# Patient Record
Sex: Female | Born: 1950 | Race: White | Hispanic: No
Health system: Southern US, Community
[De-identification: ages and names within clinical notes are randomized; demographics above are authoritative.]

## PROBLEM LIST (undated history)

## (undated) DIAGNOSIS — J302 Other seasonal allergic rhinitis: Secondary | ICD-10-CM

## (undated) DIAGNOSIS — F419 Anxiety disorder, unspecified: Secondary | ICD-10-CM

## (undated) DIAGNOSIS — I480 Paroxysmal atrial fibrillation: Secondary | ICD-10-CM

## (undated) DIAGNOSIS — E039 Hypothyroidism, unspecified: Secondary | ICD-10-CM

## (undated) DIAGNOSIS — K219 Gastro-esophageal reflux disease without esophagitis: Secondary | ICD-10-CM

## (undated) DIAGNOSIS — I2510 Atherosclerotic heart disease of native coronary artery without angina pectoris: Secondary | ICD-10-CM

## (undated) DIAGNOSIS — I10 Essential (primary) hypertension: Secondary | ICD-10-CM

## (undated) DIAGNOSIS — D649 Anemia, unspecified: Secondary | ICD-10-CM

## (undated) DIAGNOSIS — Z8619 Personal history of other infectious and parasitic diseases: Secondary | ICD-10-CM

## (undated) DIAGNOSIS — F32A Depression, unspecified: Secondary | ICD-10-CM

## (undated) DIAGNOSIS — G43109 Migraine with aura, not intractable, without status migrainosus: Secondary | ICD-10-CM

## (undated) DIAGNOSIS — E782 Mixed hyperlipidemia: Secondary | ICD-10-CM

## (undated) DIAGNOSIS — Z5189 Encounter for other specified aftercare: Secondary | ICD-10-CM

## (undated) DIAGNOSIS — U071 COVID-19: Secondary | ICD-10-CM

## (undated) DIAGNOSIS — G4733 Obstructive sleep apnea (adult) (pediatric): Secondary | ICD-10-CM

## (undated) DIAGNOSIS — J9621 Acute and chronic respiratory failure with hypoxia: Secondary | ICD-10-CM

## (undated) DIAGNOSIS — I2699 Other pulmonary embolism without acute cor pulmonale: Secondary | ICD-10-CM

## (undated) HISTORY — DX: Anemia, unspecified: D64.9

## (undated) HISTORY — DX: Atherosclerotic heart disease of native coronary artery without angina pectoris: I25.10

## (undated) HISTORY — PX: CHOLECYSTECTOMY: SHX55

## (undated) HISTORY — PX: HERNIA REPAIR: SHX51

## (undated) HISTORY — PX: TOTAL SHOULDER ARTHROPLASTY: SHX126

## (undated) HISTORY — DX: Hypothyroidism, unspecified: E03.9

## (undated) HISTORY — DX: Anxiety disorder, unspecified: F41.9

## (undated) HISTORY — DX: Mixed hyperlipidemia: E78.2

## (undated) HISTORY — DX: Migraine with aura, not intractable, without status migrainosus: G43.109

## (undated) HISTORY — DX: Encounter for other specified aftercare: Z51.89

## (undated) HISTORY — DX: Gastro-esophageal reflux disease without esophagitis: K21.9

## (undated) HISTORY — PX: TOTAL KNEE ARTHROPLASTY: SHX125

## (undated) HISTORY — PX: TONSILLECTOMY: SUR1361

## (undated) HISTORY — DX: Depression, unspecified: F32.A

## (undated) HISTORY — PX: LUMBAR FUSION: SHX111

## (undated) HISTORY — DX: Paroxysmal atrial fibrillation: I48.0

## (undated) HISTORY — PX: ABDOMINAL MASS RESECTION: SHX1110

## (undated) HISTORY — DX: Other seasonal allergic rhinitis: J30.2

## (undated) HISTORY — PX: PARTIAL HYSTERECTOMY: SHX80

## (undated) HISTORY — DX: Essential (primary) hypertension: I10

## (undated) HISTORY — DX: Personal history of other infectious and parasitic diseases: Z86.19

---

## 1996-12-12 ENCOUNTER — Emergency Department: Admit: 1996-12-12 | Payer: Self-pay | Source: Emergency Department | Admitting: Emergency Medicine

## 1997-03-01 ENCOUNTER — Ambulatory Visit: Admission: AD | Admit: 1997-03-01 | Payer: Self-pay | Source: Ambulatory Visit | Admitting: Urology

## 1997-04-08 ENCOUNTER — Ambulatory Visit
Admission: RE | Admit: 1997-04-08 | Disposition: A | Discharge status: Home / Self Care | Payer: Self-pay | Source: Ambulatory Visit | Admitting: Urology

## 2007-05-29 DIAGNOSIS — M5137 Other intervertebral disc degeneration, lumbosacral region: Secondary | ICD-10-CM

## 2007-05-29 HISTORY — DX: Other intervertebral disc degeneration, lumbosacral region: M51.37

## 2007-05-30 DIAGNOSIS — G5600 Carpal tunnel syndrome, unspecified upper limb: Secondary | ICD-10-CM

## 2007-05-30 HISTORY — DX: Carpal tunnel syndrome, unspecified upper limb: G56.00

## 2007-07-10 DIAGNOSIS — M5126 Other intervertebral disc displacement, lumbar region: Secondary | ICD-10-CM

## 2007-07-10 HISTORY — DX: Other intervertebral disc displacement, lumbar region: M51.26

## 2008-02-09 DIAGNOSIS — I219 Acute myocardial infarction, unspecified: Secondary | ICD-10-CM

## 2008-02-09 HISTORY — DX: Acute myocardial infarction, unspecified: I21.9

## 2008-10-16 ENCOUNTER — Emergency Department: Admit: 2008-10-16 | Payer: Self-pay | Source: Emergency Department | Admitting: Emergency Medical Services

## 2009-05-07 DIAGNOSIS — G959 Disease of spinal cord, unspecified: Secondary | ICD-10-CM

## 2009-05-07 DIAGNOSIS — M4802 Spinal stenosis, cervical region: Secondary | ICD-10-CM

## 2009-05-07 HISTORY — DX: Disease of spinal cord, unspecified: G95.9

## 2009-05-07 HISTORY — DX: Spinal stenosis, cervical region: M48.02

## 2009-07-30 DIAGNOSIS — M47812 Spondylosis without myelopathy or radiculopathy, cervical region: Secondary | ICD-10-CM

## 2009-07-30 DIAGNOSIS — M4306 Spondylolysis, lumbar region: Secondary | ICD-10-CM

## 2009-07-30 HISTORY — DX: Spondylolysis, lumbar region: M43.06

## 2009-07-30 HISTORY — DX: Spondylosis without myelopathy or radiculopathy, cervical region: M47.812

## 2011-08-19 DIAGNOSIS — G4733 Obstructive sleep apnea (adult) (pediatric): Secondary | ICD-10-CM

## 2011-08-19 DIAGNOSIS — Z9989 Dependence on other enabling machines and devices: Secondary | ICD-10-CM | POA: Insufficient documentation

## 2011-08-19 DIAGNOSIS — Z955 Presence of coronary angioplasty implant and graft: Secondary | ICD-10-CM

## 2011-08-19 HISTORY — DX: Obstructive sleep apnea (adult) (pediatric): G47.33

## 2011-08-19 HISTORY — DX: Presence of coronary angioplasty implant and graft: Z95.5

## 2012-10-07 ENCOUNTER — Inpatient Hospital Stay
Admission: AD | Admit: 2012-10-07 | Discharge: 2012-10-09 | DRG: 206 | Disposition: A | Discharge status: Home / Self Care | Payer: Medicare Other | Source: Other Acute Inpatient Hospital | Attending: Internal Medicine | Admitting: Internal Medicine

## 2012-10-07 ENCOUNTER — Encounter: Payer: Self-pay | Admitting: Internal Medicine

## 2012-10-07 ENCOUNTER — Inpatient Hospital Stay: Payer: Medicare Other | Admitting: Hospitalist

## 2012-10-07 ENCOUNTER — Observation Stay: Payer: Medicare Other

## 2012-10-07 DIAGNOSIS — I2699 Other pulmonary embolism without acute cor pulmonale: Secondary | ICD-10-CM

## 2012-10-07 DIAGNOSIS — M79609 Pain in unspecified limb: Secondary | ICD-10-CM | POA: Diagnosis present

## 2012-10-07 DIAGNOSIS — M940 Chondrocostal junction syndrome [Tietze]: Principal | ICD-10-CM | POA: Diagnosis present

## 2012-10-07 DIAGNOSIS — D649 Anemia, unspecified: Secondary | ICD-10-CM | POA: Diagnosis present

## 2012-10-07 DIAGNOSIS — E039 Hypothyroidism, unspecified: Secondary | ICD-10-CM | POA: Diagnosis present

## 2012-10-07 DIAGNOSIS — K219 Gastro-esophageal reflux disease without esophagitis: Secondary | ICD-10-CM | POA: Diagnosis present

## 2012-10-07 DIAGNOSIS — Z87442 Personal history of urinary calculi: Secondary | ICD-10-CM

## 2012-10-07 DIAGNOSIS — E669 Obesity, unspecified: Secondary | ICD-10-CM | POA: Diagnosis present

## 2012-10-07 DIAGNOSIS — Z6841 Body Mass Index (BMI) 40.0 and over, adult: Secondary | ICD-10-CM

## 2012-10-07 DIAGNOSIS — Z8249 Family history of ischemic heart disease and other diseases of the circulatory system: Secondary | ICD-10-CM

## 2012-10-07 DIAGNOSIS — Z9861 Coronary angioplasty status: Secondary | ICD-10-CM

## 2012-10-07 DIAGNOSIS — I2510 Atherosclerotic heart disease of native coronary artery without angina pectoris: Secondary | ICD-10-CM | POA: Diagnosis present

## 2012-10-07 DIAGNOSIS — G4733 Obstructive sleep apnea (adult) (pediatric): Secondary | ICD-10-CM | POA: Diagnosis present

## 2012-10-07 DIAGNOSIS — E785 Hyperlipidemia, unspecified: Secondary | ICD-10-CM | POA: Diagnosis present

## 2012-10-07 DIAGNOSIS — M109 Gout, unspecified: Secondary | ICD-10-CM | POA: Diagnosis present

## 2012-10-07 DIAGNOSIS — N200 Calculus of kidney: Secondary | ICD-10-CM

## 2012-10-07 DIAGNOSIS — I10 Essential (primary) hypertension: Secondary | ICD-10-CM | POA: Diagnosis present

## 2012-10-07 DIAGNOSIS — Z7901 Long term (current) use of anticoagulants: Secondary | ICD-10-CM

## 2012-10-07 DIAGNOSIS — Z7982 Long term (current) use of aspirin: Secondary | ICD-10-CM

## 2012-10-07 DIAGNOSIS — Z91041 Radiographic dye allergy status: Secondary | ICD-10-CM

## 2012-10-07 DIAGNOSIS — I252 Old myocardial infarction: Secondary | ICD-10-CM

## 2012-10-07 DIAGNOSIS — Z90710 Acquired absence of both cervix and uterus: Secondary | ICD-10-CM

## 2012-10-07 DIAGNOSIS — F3289 Other specified depressive episodes: Secondary | ICD-10-CM | POA: Diagnosis present

## 2012-10-07 DIAGNOSIS — Z86711 Personal history of pulmonary embolism: Secondary | ICD-10-CM

## 2012-10-07 HISTORY — DX: Gout, unspecified: M10.9

## 2012-10-07 HISTORY — DX: Calculus of kidney: N20.0

## 2012-10-07 HISTORY — DX: Obesity, unspecified: E66.9

## 2012-10-07 HISTORY — DX: Other pulmonary embolism without acute cor pulmonale: I26.99

## 2012-10-07 LAB — ECG 12-LEAD
Atrial Rate: 84 {beats}/min
P Axis: 43 degrees
P-R Interval: 174 ms
Q-T Interval: 376 ms
QRS Duration: 84 ms
QTC Calculation (Bezet): 444 ms
R Axis: -6 degrees
T Axis: 3 degrees
Ventricular Rate: 84 {beats}/min

## 2012-10-07 LAB — TROPONIN I
Troponin I: 0.02 ng/mL (ref 0.00–0.09)
Troponin I: 0 ng/mL (ref 0.00–0.09)
Troponin I: 0.02 ng/mL (ref 0.00–0.09)

## 2012-10-07 LAB — CK
Creatine Kinase (CK): 22 U/L — ABNORMAL LOW (ref 29–168)
Creatine Kinase (CK): 21 U/L — ABNORMAL LOW (ref 29–168)
Creatine Kinase (CK): 22 U/L — ABNORMAL LOW (ref 29–168)

## 2012-10-07 LAB — PT/INR
PT INR: 2.2 — ABNORMAL HIGH (ref 0.9–1.1)
PT: 24.6 — ABNORMAL HIGH (ref 12.6–15.0)

## 2012-10-07 MED ORDER — WARFARIN SODIUM 2 MG PO TABS
4.0000 mg | ORAL_TABLET | Freq: Every day | ORAL | Status: DC
Start: 2012-10-07 — End: 2012-10-09
  Administered 2012-10-07 – 2012-10-08 (×2): 4 mg via ORAL
  Filled 2012-10-07 (×2): qty 2

## 2012-10-07 MED ORDER — PREDNISONE 5 MG PO TABS
50.0000 mg | ORAL_TABLET | Freq: Four times a day (QID) | ORAL | Status: AC
Start: 2012-10-08 — End: 2012-10-09
  Administered 2012-10-08 – 2012-10-09 (×3): 50 mg via ORAL
  Filled 2012-10-07 (×6): qty 2

## 2012-10-07 MED ORDER — ASPIRIN 81 MG PO CHEW
81.0000 mg | CHEWABLE_TABLET | Freq: Every day | ORAL | Status: DC
Start: 2012-10-07 — End: 2012-10-09
  Administered 2012-10-07 – 2012-10-09 (×3): 81 mg via ORAL
  Filled 2012-10-07 (×3): qty 1

## 2012-10-07 MED ORDER — LISINOPRIL 20 MG PO TABS
20.0000 mg | ORAL_TABLET | Freq: Every evening | ORAL | Status: DC
Start: 2012-10-07 — End: 2012-10-08
  Administered 2012-10-07: 20 mg via ORAL
  Filled 2012-10-07: qty 1

## 2012-10-07 MED ORDER — NALOXONE HCL 0.4 MG/ML IJ SOLN
0.2000 mg | INTRAMUSCULAR | Status: DC | PRN
Start: 2012-10-07 — End: 2012-10-09

## 2012-10-07 MED ORDER — NITROGLYCERIN 0.4 MG SL SUBL
0.4000 mg | SUBLINGUAL_TABLET | SUBLINGUAL | Status: DC | PRN
Start: 2012-10-07 — End: 2012-10-09

## 2012-10-07 MED ORDER — PANTOPRAZOLE SODIUM 40 MG PO TBEC
40.0000 mg | DELAYED_RELEASE_TABLET | Freq: Every day | ORAL | Status: DC
Start: 2012-10-07 — End: 2012-10-09
  Administered 2012-10-07 – 2012-10-09 (×3): 40 mg via ORAL
  Filled 2012-10-07 (×3): qty 1

## 2012-10-07 MED ORDER — METOPROLOL TARTRATE 25 MG PO TABS
12.5000 mg | ORAL_TABLET | Freq: Two times a day (BID) | ORAL | Status: DC
Start: 2012-10-07 — End: 2012-10-09
  Administered 2012-10-07 – 2012-10-09 (×5): 12.5 mg via ORAL
  Filled 2012-10-07 (×5): qty 1

## 2012-10-07 MED ORDER — LEVOTHYROXINE SODIUM 25 MCG PO TABS
25.0000 ug | ORAL_TABLET | Freq: Every day | ORAL | Status: DC
Start: 2012-10-07 — End: 2012-10-09
  Administered 2012-10-07 – 2012-10-09 (×3): 25 ug via ORAL
  Filled 2012-10-07 (×3): qty 1

## 2012-10-07 MED ORDER — ONDANSETRON HCL 4 MG/2ML IJ SOLN
4.0000 mg | Freq: Three times a day (TID) | INTRAMUSCULAR | Status: DC | PRN
Start: 2012-10-07 — End: 2012-10-09
  Administered 2012-10-08: 4 mg via INTRAVENOUS
  Filled 2012-10-07: qty 2

## 2012-10-07 MED ORDER — MORPHINE SULFATE 2 MG/ML IJ/IV SOLN (WRAP)
2.0000 mg | Status: DC | PRN
Start: 2012-10-07 — End: 2012-10-09
  Administered 2012-10-07 – 2012-10-09 (×8): 2 mg via INTRAVENOUS
  Filled 2012-10-07 (×8): qty 1

## 2012-10-07 MED ORDER — DIPHENHYDRAMINE HCL 25 MG PO CAPS
50.0000 mg | ORAL_CAPSULE | Freq: Once | ORAL | Status: AC
Start: 2012-10-09 — End: 2012-10-09
  Administered 2012-10-09: 50 mg via ORAL
  Filled 2012-10-07: qty 2

## 2012-10-07 MED ORDER — ALLOPURINOL 300 MG PO TABS
300.0000 mg | ORAL_TABLET | Freq: Every day | ORAL | Status: DC
Start: 2012-10-07 — End: 2012-10-09
  Administered 2012-10-07 – 2012-10-09 (×3): 300 mg via ORAL
  Filled 2012-10-07 (×3): qty 1

## 2012-10-07 MED ORDER — ACETAMINOPHEN 325 MG PO TABS
650.0000 mg | ORAL_TABLET | Freq: Four times a day (QID) | ORAL | Status: DC | PRN
Start: 2012-10-07 — End: 2012-10-09
  Administered 2012-10-08: 650 mg via ORAL
  Filled 2012-10-07: qty 2

## 2012-10-07 MED ORDER — PRAVASTATIN SODIUM 20 MG PO TABS
40.0000 mg | ORAL_TABLET | Freq: Every day | ORAL | Status: DC
Start: 2012-10-07 — End: 2012-10-09
  Administered 2012-10-07 – 2012-10-09 (×3): 40 mg via ORAL
  Filled 2012-10-07 (×3): qty 2

## 2012-10-07 MED ORDER — LISINOPRIL 20 MG PO TABS
20.0000 mg | ORAL_TABLET | Freq: Every day | ORAL | Status: DC
Start: 2012-10-07 — End: 2012-10-07
  Filled 2012-10-07: qty 1

## 2012-10-07 MED ORDER — CITALOPRAM HYDROBROMIDE 20 MG PO TABS
20.0000 mg | ORAL_TABLET | Freq: Every day | ORAL | Status: DC
Start: 2012-10-07 — End: 2012-10-09
  Administered 2012-10-07 – 2012-10-09 (×3): 20 mg via ORAL
  Filled 2012-10-07 (×3): qty 1

## 2012-10-07 MED ORDER — POLYETHYLENE GLYCOL 3350 17 G PO PACK
17.0000 g | PACK | Freq: Every day | ORAL | Status: DC
Start: 2012-10-07 — End: 2012-10-09
  Administered 2012-10-07 – 2012-10-08 (×2): 17 g via ORAL
  Filled 2012-10-07 (×3): qty 1

## 2012-10-07 MED ORDER — WARFARIN SODIUM 2 MG PO TABS
4.0000 mg | ORAL_TABLET | Freq: Every day | ORAL | Status: DC
Start: 2012-10-07 — End: 2012-10-07

## 2012-10-07 NOTE — Plan of Care (Signed)
Please make sure pt receives her AM dose of metoprolol prior to the Coronary CT on Monday morning 10/09/12, thanks

## 2012-10-07 NOTE — H&P (Signed)
ADMISSION HISTORY AND PHYSICAL EXAM    Date Time: 10/07/2012 3:28 AM  Patient Name: [name]  Attending Physician: Jerral Ralph, MD  Primary Care Physician: No primary provider on file.    CC: Transfer from [place] for chest pain  Patient seen and examined in CTU [place] at 3:15am.      Assessment:   Active Problems:   Chest pain   Coronary artery disease   HTN (hypertension)   HLD (hyperlipidemia)   Gout   Nephrolithiasis   History of pulmonary embolism   Hypothyroid   Obesity   Depression        62 yo WF excellent historian with history of CAD/MI s/p stents x6 (four 06/2008, one 06/2008, one 12/2008), history of PE May 2014 on Coumadin, obesity, HTN, HLD, hypothyroidism, depression, gout, h/o kidney stones, anemia who was transferred from [facility] for CP, ACS rule out.     Plan:   # Chest Pain: concerning for ACS givesn significant history and risk factors, but reassuring neg chemical stress test July 2014  - admit observation, cardiac tele  - cardiac enzymes q6  - continue home: ASA, ACE, BB, statin, PPI (was on eliquis for DES, but cards told her while on Coumadin she can be off it. Will eventually go back on eliquis when Coumadin done in 6 months)  - PRN zofran, PRN morphine  - recheck EKG  - NPO except meds    #History of PE: continue Coumadin, f/u INR in AM    # HTN: continue BB, ACE  # HLD: continue statin, ASA  # History of gout: continue allopurniol  # Hypothyroidism: continue synthroid  # Depression: continue celexa  # Anemia, normocytic: continue to monitor  # GERD: continue home PPI  # FEN  - no IVF  - NPO except meds    #PPx  - SCDs, continue home PPI, fully anticoagulated    Full Code      Disposition:     Anticipated medical stability for discharge: August,  30 - Evening  Service status: Observation:patient is stable and improving.  Reason for ongoing hospitalization: trend CE  Anticipated discharge needs: cards follow up      History of Presenting Illness:   [name] is a 62  y.o. female who presents to the hospital with chest pain.    62 yo WF excellent historian with history of CAD/MI s/p stent x6 (LAD and RCA, most recently Nov 2010), HTN, HLD, PE May 2014 on Coumadin, obesity, hypothyroidism, gout, anemia, h/o kidney stones presents with chest pain. Pt lives in [place]. Was driving with friend to [place], [place] and on way home this afternoon ~3pm when noted onset of left sided chest heaviness radiating up to neck and then eventually to left arm. Pulled over and vomited. Continued to drive. Pulled over to vomit again. Dropped friend off and went to nearest hospital which was [facility]. She thought this was indigestion which is why she continued driving. However now she thinks it does feel similar to her MI in May 2010. She states CP was heaviness, occasionally sharp. It would lighten, but never fully go away and it was always there. The previous 2 days, she felt tired, slept more often. Otherwise no recent diarrhea, constipation, dysuria, LE edema, othopnea, PNA, other travel, poor PO, med non-adherence.     Cards is Dr. Alyce Pagan at [facility]. Had a chemical stress test July 2014 that was "unremarkable"    PE was May 2014, unprovoked, currently  on Coumadin x6 months. Admitted x1 week where also discovered to have anemia and received PRBC transfusion. Had an EGD which was unremarkable. Unable to do colo as on Coumadin. But had a colo Nov 2013 which was reportedly normal. Had mammogram within the year, unremarkable. Has hysterectomy for AUB after delivery of child.    Past Medical History:     Past Medical History   Diagnosis Date   . Seasonal allergic rhinitis    . Anemia    . Encounter for blood transfusion    . Depression    . Gastroesophageal reflux disease    . Hyperlipidemia    . Hypertension    . Myocardial infarction    . Disorder of thyroid        Available old records reviewed, including:  Never been to [facility], review [facility] records    Past Surgical History:      Past Surgical History   Procedure Date   . Joint replacement    . Abdominal surgery    . Back surgery    . Spine surgery    . Hernia repair    . Hysterectomy        Family History:   Sister - fatal MI at age 31  Multiple cousins, MI  Paternal aunt - MI  Maternal aunt - MI    Social History:     History   Smoking status   . Never Smoker    Smokeless tobacco   . Not on file     History   Alcohol Use No     History   Drug Use No     Never smokes, doesn't drink  Lives alone in [place]  Independent in all ADLs  2 children    Allergies:     Allergies   Allergen Reactions   . Codeine    . Iodine    . Red Dye    . Tramadol        Medications:     Current/Home Medications    ALLOPURINOL (ZYLOPRIM) 300 MG TABLET    Take 300 mg by mouth daily.    ASPIRIN 81 MG CHEWABLE TABLET    Chew 81 mg by mouth daily.    CHOLINE FENOFIBRATE (FENOFIBRIC ACID) 135 MG CAPSULE DELAYED RELEASE    Take by mouth.    CITALOPRAM (CELEXA) 20 MG TABLET    Take 20 mg by mouth daily.    LEVOTHYROXINE (SYNTHROID, LEVOTHROID) 25 MCG TABLET    Take 25 mcg by mouth.    METOPROLOL (LOPRESSOR) 25 MG TABLET    Take 12.5 mg by mouth 2 (two) times daily.    PANTOPRAZOLE (PROTONIX) 40 MG TABLET    Take 40 mg by mouth daily.    PRAVASTATIN (PRAVACHOL) 40 MG TABLET    Take 40 mg by mouth daily.    QUINAPRIL (ACCUPRIL) 20 MG TABLET    Take 20 mg by mouth nightly.    WARFARIN (COUMADIN) 4 MG TABLET    Take 4 mg by mouth daily.        Method by which medications were confirmed on admission: pt confirms    Review of Systems:   All other systems were reviewed and are negative except: per HPI    Physical Exam:     Patient Vitals for the past 24 hrs:   BP Temp Temp src Pulse Resp SpO2 Height Weight   10/07/12 0109 145/77 mmHg 96.8 F (36 C) Oral 84  15  94 %  1.524 m (5') 97.523 kg (215 lb)     Body mass index is 41.99 kg/(m^2).  No intake or output data in the 24 hours ending 10/07/12 0328    General: awake, alert, oriented x 3; no acute distress, obese,  pleasant  HEENT: perrla, eomi, sclera anicteric  oropharynx clear without lesions, mucous membranes moist  Neck: supple, no lymphadenopathy, no thyromegaly, no JVD, no carotid bruits  Cardiovascular: regular rate and rhythm, no murmurs, rubs or gallops  Lungs: clear to auscultation bilaterally, without wheezing, rhonchi, or rales  Abdomen: soft, non-tender, non-distended; no palpable masses, no hepatosplenomegaly, normoactive bowel sounds, no rebound or guarding  Chest: no chest wall or neck tenderness  Extremities: no clubbing, cyanosis, or edema  Neuro: cranial nerves grossly intact, strength 5/5 in upper and lower extremities, sensation intact, face symmetric  Skin: no rashes or lesions noted        Labs:     Results     ** No Results found for the last 24 hours. **        10/06/2012 [facility]  Na 144, K 4.2, Cl 110, HCO 24, BUN 24, Cr 1.1, Glu 95  TPro 7.3, Alb 4.1, AST 27, ALT 26, AlkP 50  INR 2.62  WBC 4.56, Hg 10.6, Hct 32.6, Plt 201, MCV 92.1  CKMB 0.25  Trop <0.012  EKG: TWI V1      Imaging personally reviewed, including: none - comes with a CD, but no report or mention in ER report of results    Safety Checklist  DVT prophylaxis:  CHEST guideline (See page e199S) Chemical and / or mechanical ppx NOT indicated or contraindicated: Fully anticoagulated    Foley:  [place] Rn Foley protocol Not present   IVs:  Peripheral IV   PT/OT: Not needed   Daily CBC & or Chem ordered:  SHM/ABIM guidelines (see #5) No   Reference for approximate charges of common labs: CBC auto diff - $76  BMP - $99  Mg - $79    Signed by: Jerral Ralph, MD   cc:No primary provider on file.

## 2012-10-07 NOTE — Progress Notes (Signed)
Outside records reviewed:    Inferior STEMI 06/26/2008:  4 BMS placed in RCA    On 06/28/2008:  Staged procedure:  BMS to LAD and PTCA to D1    November 2010:  Presented with recurrent angina and reportedly negative enzymes  She had in-stent Restenosis of the RCA  Received Cutting balloon and DES to mid RCA    Several admissions after that for CP    Approximately 4 Negative Nuclear Stress Tests between December 2011 and October 2013    Cardiac cath April 2011 and February 2012:  Both showed no significant obstructive CAD    Admitted 8/19 to 09/28/2012 with chest pain  Echo 09/27/12:  EF 60-65%, moderate LVH, LAE, LV diastolic relaxation abnormality   Lexiscan Nuclear Stress Test 09/28/12:  + breast attenuation but no ischemia or infarction,  EF 65%      IMPRESSION:    Apparently her sx's are difficult to interpret  She had significant obstruction in May 2010 with acute MI and then again in November 2010 with re-stenosis  Since then, multiple admissions for CP, 2 caths, probably 5 Nuclear Stress Tests, all negative for ischemia or obstruction    Today (10/07/2012) she presents with prolonged CP, negative cardiac enzymes, unremarkable EKG  However, her Stress test, although negative by EKG criteria, was concerning in that she had worsened CP  In addition she had desaturation on treadmill, which may be due to her lung disease (CXR and CTA reportedly negative at [facility])    Given the uncertainty about the diagnosis, I would favor more definitive evaluation  Because she is on anticoagulation would like to avoid cardiac cath if possible because of the possibility of groin complications when anticoagulation is resumed post procedure  Therefore would recommend a CTA of the Coronaries  If that is negative then no further evaluation is required and in fact she can stay on anticoagulation before, during, and after the procedure    She has IVP dye allergy, so would need pre-medication    Have spoken to Radiology, the  soonest they can do it is on Monday AM in 2 days     PLAN:     ? Prednisone orally 13 hours, 7 hours, and 1 hour before (in adults, 50 mg per dose)  ? Benadryl, orally or parenterally given 1 hour before (in adults, 50 mg)      10/07/2012  5:50 PM  Darrol Poke, MD, [facility]  [place]    Office: [phone]

## 2012-10-07 NOTE — Consults (Addendum)
HOSPITAL CONSULTATION NOTE    [facility] ([facility])           LOS: 0 days       I had the pleasure of seeing [name] today.    [name] is a very pleasant 62 y.o. white female      CHIEF COMPLAINT:    Chest pain        HPI:    She has a prior cardiac history    MI in May 2010, received multiple stents  Recurrent sx's November 2010, had additional stenting  Initial sx's = chest heaviness, right arm pain May 2010  Sx's November 2010 = chest heavy + back pain    Has had occasional non-exertional chest pain since then and has had multiple stress tests and at least 1 cardiac cath  States that most recent cath was within the past 1 year    Admitted May 2014 with sharp CP and new SOB, dx with PE, on coumadin since then  Had CP for about 1 month afterwards, then resolved    Mid-August 2014:  Seen at [facility] with abdominal and chest pain  Reportedly had a chemical stress test which was unremarkable     Now admitted with prolonged CP and vomiting  On 8/29 was driving and had right lower back pain and right leg pain and vomiting around 1 PM  Then around 3 PM started having CP (heaviness) and tingling in left arm  These sx's have persisted and are still present, but get better with morphine, although not completely relieved    She is typically able to climb stairs, without any CP, although she has inconsistently occurring SOB with tath    + bilateral calf ache for 6 months, but states that "No DVT" when dx with PE in May 2014        PAST MEDICAL HISTORY:    Patient Active Problem List   Diagnosis   . Chest pain   . Coronary artery disease   . HTN (hypertension)   . HLD (hyperlipidemia)   . Gout   . Nephrolithiasis   . History of pulmonary embolism   . Hypothyroid   . Obesity   . Depression       Past Medical History   Diagnosis Date   . Seasonal allergic rhinitis    . Anemia    . Encounter for blood transfusion    . Depression    . Gastroesophageal reflux disease    . Hyperlipidemia    . Hypertension     . Myocardial infarction    . Disorder of thyroid        Past Surgical History   Procedure Date   . Joint replacement    . Abdominal surgery    . Back surgery    . Spine surgery    . Hernia repair    . Hysterectomy        Prior to Admission medications    Medication Sig Start Date End Date Taking? Authorizing Provider   allopurinol (ZYLOPRIM) 300 MG tablet Take 300 mg by mouth daily.   Yes [provider]   aspirin 81 MG chewable tablet Chew 81 mg by mouth daily.   Yes [provider]   Choline Fenofibrate (FENOFIBRIC ACID) 135 MG Capsule Delayed Release Take by mouth.   Yes [provider]   citalopram (CELEXA) 20 MG tablet Take 20 mg by mouth daily.   Yes [provider]  levothyroxine (SYNTHROID, LEVOTHROID) 25 MCG tablet Take 25 mcg by mouth.   Yes [provider]   metoprolol (LOPRESSOR) 25 MG tablet Take 12.5 mg by mouth 2 (two) times daily.   Yes [provider]   pantoprazole (PROTONIX) 40 MG tablet Take 40 mg by mouth daily.   Yes [provider]   pravastatin (PRAVACHOL) 40 MG tablet Take 40 mg by mouth daily.   Yes [provider]   quinapril (ACCUPRIL) 20 MG tablet Take 20 mg by mouth nightly.   Yes [provider]   warfarin (COUMADIN) 4 MG tablet Take 4 mg by mouth daily.   Yes [provider]       Scheduled Meds:  Current Facility-Administered Medications   Medication Dose Route Frequency   . allopurinol  300 mg Oral Daily   . aspirin  81 mg Oral Daily   . citalopram  20 mg Oral Daily   . levothyroxine  25 mcg Oral Daily at 0600   . lisinopril  20 mg Oral QPM   . metoprolol  12.5 mg Oral BID   . pantoprazole  40 mg Oral Daily   . pravastatin  40 mg Oral Daily   . warfarin  4 mg Oral Daily at 1800   . [DISCONTINUED] lisinopril  20 mg Oral Daily   . [DISCONTINUED] warfarin  4 mg Oral Daily at 1800     Continuous Infusions:   PRN Meds:.acetaminophen, morphine, naloxone, nitroglycerin, ondansetron      REVIEW OF  SYSTEMS:     + Occasional headache  + Liver cyst    All other systems reviewed and negative except as stated above        ALLERGIES:   Allergies   Allergen Reactions   . Codeine    . Iodine    . Red Dye    . Tramadol      IVP dye ===>>>>  Hives, swelling      SOCIAL HISTORY:   Marital status:   divorced  Occupation:   On disability  Alcohol:   None       CORONARY RISK FACTORS:  Age, Dyslipidemia, Hypertension, Family History of Premature CAD in first-degree relative        FAMILY HISTORY:  No family history of Sudden Cardiac Death  + family history of premature CAD:  Sister died MI age 42          PHYSICAL EXAM:  BP 127/56  Pulse 76  Temp 97.3 F (36.3 C) (Oral)  Resp 18  Ht 1.524 m (5')  Wt 97.523 kg (215 lb)  BMI 41.99 kg/m2  SpO2 93%  Body mass index is 41.99 kg/(m^2).  General:  Well-appearing, alert, cooperative, communicative  Chest- clear   Cor- RR  Normal S1 and S2.  No S3, No S4  No Murmur   Neck- Supple, no JVD  Extremities- trace  bilateral LE edema  Neuro: Grossly non-focal  Oropharynx:  Mouth- mucus membranes moist  Skin turgor is normal  Eyes:  Sclera are non-icteric  Abdomen: soft, non-tender  Carotids: brisk bilaterally without bruit or transmitted murmur  Pedal Pulses:  2+ bilateral        EKG 10/07/2012  -  NSR  Borderline First degree AV block   Minor Non-specific T-wave inversion V1 to V3 and flattening in V4, no significant change vs 08/15/12      TELEMETRY:  NSR      No intake or output data in the 24  hours ending 10/07/12 1211      Labs Reviewed:     No results found for this basename: WBC:3,HGB:3,HCT:3,PLT:3 in the last 72 hours  Recent Labs   Basename 10/07/12 1027 10/07/12 0421    TROPI 0.00 0.02    ISTATTROPONI -- --    CK 21* 22*    CKMB -- --    BNP -- --    No results found for this basename: NA:3,K:3,CO2:3,BUN:3,CREAT:3,GLU:3,MG:3 in the last 72 hours  Recent Labs   [facility] 10/07/12 0421    INR 2.2*    TSH --    CHOL --    TRIG --    HDL --    LDL --        Rads:     Radiology  Results (24 Hour)     ** No Results found for the last 24 hours. **            IMPRESSION / RECOMMENDATION :    [name] is a very pleasant 62 y.o.female    Known CAD  MI in May 2010 with stenting  Stenting in November 2010 for recurrent sx's    However, current sx's are Atypical:  They are present for almost 24 hours, with no new EKG changes and multiple negative Troponin  Therefore suspect current sx's non-ischemic    She seems to have a hx of both ischemic CP and non-ischemic CP, as she has required stenting at 2 separate times (both in 2010)  Has had at least 1 cardiac cath per her report in the past year "No bad blockages"  She reports a chemical stress test this year in both January and earlier this month    HTN  Well controlled  Continue Lisinopril and beta blocker    CAD  Continue ACE-I, beta blocker, ASA, statin    Hx PE  On coumadin    IVP dye allergy    With regards to evaluating her CP:  I would not repeat a Nuclear Stress Test because she just had one 2 weeks ago  I have sent a records request to [facility], which is where she has had her previous cardiac w/u's including stenting, cath, Nuclear Stress Test    Can attempt to do a regular treadmill stress test today for risk stratification      10/07/2012  12:30 PM  Darrol Poke, MD, [facility]  [place]    Office: [phone]

## 2012-10-07 NOTE — Progress Notes (Addendum)
Attending Follow up Attestation:     I have seen and personally examined the patient.  Patient admitted by Dr. Joni Fears earlier today.    Active Hospital Problems    Diagnosis   . Chest pain   . Coronary artery disease   . HTN (hypertension)   . HLD (hyperlipidemia)   . Gout   . Nephrolithiasis   . History of pulmonary embolism   . Hypothyroid   . Obesity   . Depression   . GERD (gastroesophageal reflux disease)   . Anemia   . OSA (obstructive sleep apnea)     Plan:   CE neg x 3   ASA, BB, ACEI, statin,   Eliquis held while on Coumadin for PE.  INR therapeutic   Continue allopurinol, synthroid, celexa   Continue home PPI   Wean O2 as tolerated.   At this point s/p treadmill    LE w/o DVT's per doppler.   CT angiogram for coronaries given uncertainty of diagnosis.  Avoid cath 2/2 groin complications and anticoagulant adjustments.  Radiology can do it Monday AM (in 2 days)   Pre med with benadryl and prednisone needed.   BB prior to CTA.   Change to inpatient status as persistent chest pain, uncertain diagnostic nature and medically necessary requirement of staying >= 2 nights for monitoring and diagnostics with IV morphine prn.. If her chest pain improves by tomorrow we may be able to explore getting the CTA done as an outpatient.  If chest pain persists then will definitely need to stay in patient.     Disposition:  Today's date: 10/07/2012  Anticipated Discharge date/time: 9/1-2/14  Reason for ongoing hospitalization: CTA   Anticipated discharge needs: none.    Jaci Standard, MD

## 2012-10-07 NOTE — Plan of Care (Signed)
Pt is a/o x 3, VSS, pt is NSR on tele. Pt is on 3 L NC stated she had desat yesterday to 60 and when she takes it off she gets SOB. Pt's troponin and CK have been negative. Pt is currently up in adult observation doing a treadmill stress test, once the patient is back I will do an ambulatory o2 test on her and pt will be going for a Venous doppler of bilateral legs. Will continue to monitor pt per unit protocol.

## 2012-10-07 NOTE — Plan of Care (Signed)
Admit Note:  Pt admitted from [facility] ER with chest pain, SOB.  Pt was driving in car with a friend yesterday afternoon and developed chest pain, N/V.  Cardiac enzymes at [facility] negative.  Pt has hx of MI in jan 2010, 6 stents placed in 2010 as well.  Medicated with ASA 324 mg and Morphine 2 mg x 3.  Pt able to ambulate from stretcher to bed with no difficulty.  Medicated with Morphine 2 mg IV x 1 for c/o L sided chest pain.  Pt resting comfortably at this time.  NSR on tele

## 2012-10-07 NOTE — Procedures (Signed)
Cardiovascular Stress Test  10/07/2012  :    Baseline EKG:    NSR with minor anterior T-wave inversion   Bruce Protocol, exercised  3:05  Stopped due to   Target HR, worsened CP, SOB   Peak HR:  89 % MPHR  Chest pain:   Was present at the beginning of test and worsened with exercise  No SOB at rest, but + SOB with exercise   No significant ST changes   Dysrhythmias:   None  BP response:  Normal  Resting O2 sat 97%,  Dropped to 90% with exercise (both with 3 L/Minute oxygen flow)    Impression:     Test is negative for ischemia by EKG criteria  Poor exercise capacity with Atypical chest pain at rest, worsened with exercise  DOE on treadmill with reduced O2 sat during exercise      I was present and personally supervised this test.    2:26 PM  10/07/2012     Darrol Poke, MD, [facility]  [place]    Office: [phone]

## 2012-10-08 NOTE — Plan of Care (Signed)
Telemetry: NSR. HR: 50-70s. A&Ox4. VS: WNL. Independent. Pt complains of pain 5/10 in chest/back area and legs- morphine IV and tylenol PO administered. Pt aware of NPO at midnight for cardiac scan tomorrow. Pt must have Lopressor in am. Pt denies SOB. Bed lowered, call bell in reach, will continue to monitor pt care.

## 2012-10-08 NOTE — Progress Notes (Signed)
MEDICINE PROGRESS NOTE    Date Time: 10/08/2012 6:16 PM  Patient Name: [name]  Attending Physician: Jaci Standard, MD    Assessment:     Active Hospital Problems    Diagnosis   . Chest pain   . Coronary artery disease   . HTN (hypertension)   . HLD (hyperlipidemia)   . Gout   . Nephrolithiasis   . History of pulmonary embolism   . Hypothyroid   . Obesity   . Depression   . GERD (gastroesophageal reflux disease)   . Anemia   . OSA (obstructive sleep apnea)         Plan:    ASA, BB, ACEI, statin   Eliquis held while on coumadin for PE.  INR therapeutic   Continue allopuriniol, synthroid, celexa   Home PPI   CTA in AM with premedication.    Safety Checklist:     DVT prophylaxis:  CHEST guideline (See page e199S) Chemical   Foley:  [place] Rn Foley protocol Not present   IVs:  Peripheral IV   PT/OT: Not needed   Daily CBC & or Chem ordered:  SHM/ABIM guidelines (see #5) No   Reference for approximate charges of common labs: CBC auto diff - $76  BMP - $99  Mg - $79    Lines:     Active PICC Line / CVC Line / PIV Line / Drain / Airway / Intraosseous Line / Epidural Line / ART Line / Line / Wound / Pressure Ulcer / NG/OG Tube     Name   Placement date   Placement time   Site   Days    Peripheral IV 10/06/12 Right Hand  10/06/12      Hand   2    Peripheral IV 10/07/12 Right Hand  10/07/12      Hand   1           Disposition:     Today's date: 10/08/2012  Length of Stay: 1  Anticipated medical stability for discharge: 9/1-10/10/12  Reason for ongoing hospitalization: CTA  Anticipated discharge needs: none    Subjective     CC: Chest pain    Interval History/24 hour events: no sig events    HPI/Subjective: persistent L neck/chest pain mild in nature.  Some pain in L back as well.  Non-exertional.     Review of Systems:     Negative except per HPI    Physical Exam:     VITAL SIGNS PHYSICAL EXAM   Temp:  [96.4 F (35.8 C)-97.7 F (36.5 C)] 97.7 F (36.5 C)  Heart Rate:  [51-64] 55   Resp Rate:  [16-20] 18   BP:  (82-137)/(39-62) 135/62 mmHg      Intake/Output Summary (Last 24 hours) at 10/08/12 1816  Last data filed at 10/07/12 2027   Gross per 24 hour   Intake      0 ml   Output    800 ml   Net   -800 ml    Physical Exam  General: awake, alert X 3  Cardiovascular: regular rate and rhythm, no murmurs, rubs or gallops  Lungs: clear to auscultation bilaterally, without wheezing, rhonchi, or rales  Abdomen: soft, non-tender, non-distended; no palpable masses,  normoactive bowel sounds  Extremities: no edema  No TTP in chest       Meds:     Medications were reviewed:    Labs:     Labs (last 72 hours):  No results found for this basename: WBC:2,HGB:2,HCT:2,PLT:2 in the last 168 hours      Lab 10/07/12 0421   PT 24.6*   INR 2.2*   PTT --    No results found for this basename: NA:2,K:2,CL:2,CO2:2,BUN:2,CREAT:2,CA:2,ALB:2,PROT:2,BILITOTAL:2,ALKPHOS:2,ALT:2,AST:2,GLU:2 in the last 168 hours              Signed by: Jaci Standard, MD

## 2012-10-08 NOTE — Plan of Care (Signed)
Pt stable overnight, CPAP ordered per MD.  Resp set up CPAP.  RA sats 92-94% at rest.  For Cardiac CT scan on Monday.  Pt aware of POC.  NSR on tele.  Medicated with Morphine 2 mg IV x 1 during shift.

## 2012-10-08 NOTE — Progress Notes (Signed)
HOSPITAL FOLLOW-UP NOTE    [facility] ([facility])       LOS: 1 day       I had the pleasure of seeing [name] today.    [name] is a very pleasant 62 y.o.female    SUBJECTIVE:    Feels about the same  Has DOE, no change  Still with constant CP, unchanged  Had some dizziness when her BP was low but not Otherwise   Sometimes feels her heart beating "harder" but not faster      PAST MEDICAL HISTORY:    Patient Active Problem List   Diagnosis   . Chest pain   . Coronary artery disease   . HTN (hypertension)   . HLD (hyperlipidemia)   . Gout   . Nephrolithiasis   . History of pulmonary embolism   . Hypothyroid   . Obesity   . Depression   . GERD (gastroesophageal reflux disease)   . Anemia   . OSA (obstructive sleep apnea)       Past Medical History   Diagnosis Date   . Seasonal allergic rhinitis    . Anemia    . Encounter for blood transfusion    . Depression    . Gastroesophageal reflux disease    . Hyperlipidemia    . Hypertension    . Myocardial infarction    . Disorder of thyroid         Prior to Admission medications    Medication Sig Start Date End Date Taking? Authorizing Provider   allopurinol (ZYLOPRIM) 300 MG tablet Take 300 mg by mouth daily.   Yes [provider]   aspirin 81 MG chewable tablet Chew 81 mg by mouth daily.   Yes [provider]   Choline Fenofibrate (FENOFIBRIC ACID) 135 MG Capsule Delayed Release Take by mouth.   Yes [provider]   citalopram (CELEXA) 20 MG tablet Take 20 mg by mouth daily.   Yes [provider]   levothyroxine (SYNTHROID, LEVOTHROID) 25 MCG tablet Take 25 mcg by mouth.   Yes [provider]   metoprolol (LOPRESSOR) 25 MG tablet Take 12.5 mg by mouth 2 (two) times daily.   Yes [provider]   pantoprazole (PROTONIX) 40 MG tablet Take 40 mg by mouth daily.   Yes [provider]   pravastatin (PRAVACHOL) 40 MG tablet Take 40 mg by mouth daily.   Yes [provider]   quinapril (ACCUPRIL)  20 MG tablet Take 20 mg by mouth nightly.   Yes [provider]   warfarin (COUMADIN) 4 MG tablet Take 4 mg by mouth daily.   Yes [provider]       Scheduled Meds:  Current Facility-Administered Medications   Medication Dose Route Frequency   . allopurinol  300 mg Oral Daily   . aspirin  81 mg Oral Daily   . citalopram  20 mg Oral Daily   . [START ON 10/09/2012] diphenhydrAMINE  50 mg Oral Once   . levothyroxine  25 mcg Oral Daily at 0600   . metoprolol  12.5 mg Oral BID   . pantoprazole  40 mg Oral Daily   . polyethylene glycol  17 g Oral Daily   . pravastatin  40 mg Oral Daily   . predniSONE  50 mg Oral Q6H   . warfarin  4 mg Oral Daily at 1800   . [DISCONTINUED] lisinopril  20 mg Oral QPM     Continuous Infusions:  PRN Meds:.acetaminophen, morphine, naloxone, nitroglycerin, ondansetron      ALLERGIES:   Allergies   Allergen Reactions   . Codeine    . Iodine    . Red Dye    . Tramadol            PHYSICAL EXAM:  BP 135/62  Pulse 55  Temp 97.7 F (36.5 C) (Oral)  Resp 18  Ht 1.524 m (5')  Wt 100.699 kg (222 lb)  BMI 43.36 kg/m2  SpO2 93%  Body mass index is 43.36 kg/(m^2).  General:  Well-appearing, alert, cooperative, communicative  Chest- clear   Cor- RR  Normal S1 and S2.  No S3, No S4  No Murmur   Neck- Supple, no JVD  Extremities- trace to 1+  bilateral LE edema  Neuro: Grossly non-focal  Oropharynx:  Mouth- mucus membranes moist  Skin turgor is normal  Eyes:  Sclera are non-icteric      TELEMETRY:  NSR, Sinus Brady        Intake/Output Summary (Last 24 hours) at 10/08/12 1714  Last data filed at 10/07/12 2027   Gross per 24 hour   Intake      0 ml   Output    800 ml   Net   -800 ml         Labs Reviewed:     No results found for this basename: WBC:3,HGB:3,HCT:3,PLT:3 in the last 72 hours  Recent Labs   Basename 10/07/12 1718 10/07/12 1027 10/07/12 0421    TROPI 0.02 0.00 0.02    ISTATTROPONI -- -- --    CK 22* 21* 22*    CKMB -- -- --    BNP -- -- --    No results found for this  basename: NA:3,K:3,CO2:3,BUN:3,CREAT:3,GLU:3,MG:3 in the last 72 hours  Recent Labs   Basename 10/07/12 0421    INR 2.2*    TSH --    CHOL --    TRIG --    HDL --    LDL --        Rads:     Radiology Results (24 Hour)     ** No Results found for the last 24 hours. **            IMPRESSION / RECOMMENDATION:    Please note, some of the notes in the chart state that she was switched from Eliquis to Coumadin when she had her PE  Actually she was originally on Effient, which was stopped when she started coumadin    Known CAD   MI in May 2010 with stenting   Stenting in November 2010 for recurrent sx's and re-stenosis of RCA    Now admitted with Atypical chest pain, long-lasting, no objective evidence of ischemia    She had significant obstruction in May 2010 with acute MI and then again in November 2010 with re-stenosis   Since then, multiple admissions for CP, 2 caths, probably 5 Nuclear Stress Tests, all negative for ischemia or obstruction    Admitted 10/07/2012 with CP  Stress test 10/07/2012 :  Worsened CP but no objective evidence of ischemia    Because she is on anticoagulation would like to avoid cardiac cath if possible because of the possibility of groin complications when anticoagulation is resumed post procedure   Therefore would recommend a CTA of the Coronaries   If that is negative then no further evaluation is required and in fact she can stay on anticoagulation before, during, and after the procedure  She has IVP dye allergy, so have ordered pre-medication     HTN   Well controlled   In fact, has had low BP today at times  Will hold ACE-I for now  She needs to have HR below 60 for Coronary CTA tomorrow  Continue beta blocker and will give AM dose prior to CTA    For CAD   Continue beta blocker, ASA, statin   Can resume ACE-I at d/c but at lower dosage    Hx PE   On coumadin     IVP dye allergy     Details of CAD and ischemic w/u:    Inferior STEMI 06/26/2008:   4 BMS placed in RCA   On 06/28/2008:    Staged procedure:   BMS to LAD and PTCA to D1   November 2010:   Presented with recurrent angina and reportedly negative enzymes   She had in-stent Restenosis of the RCA   Received Cutting balloon and DES to mid RCA   Several admissions after that for CP   Approximately 4 Negative Nuclear Stress Tests between December 2011 and October 2013   Cardiac cath April 2011 and February 2012:   Both showed no significant obstructive CAD     Admitted 8/19 to 09/28/2012 with chest pain   Echo 09/27/12: EF 60-65%, moderate LVH, LAE, LV diastolic relaxation abnormality   Lexiscan Nuclear Stress Test 09/28/12: + breast attenuation but no ischemia or infarction, EF 65%         10/08/2012  5:28 PM  Darrol Poke, MD, [facility]  [place]    Office: [phone]

## 2012-10-09 MED ORDER — OXYCODONE HCL 5 MG PO TABS
5.0000 mg | ORAL_TABLET | ORAL | Status: DC | PRN
Start: 2012-10-09 — End: 2015-03-20

## 2012-10-09 NOTE — Discharge Instructions (Signed)
Follow up with cardiology.  Take the oxycodone as needed.  If you need a lot consider a stool softener because it could cause constipation.  Chest Pain, Noncardiac    Based on your visit today, the exact cause of your chest pain is not certain. Your condition does not seem serious and your pain does not appear to be coming from your heart. However, sometimes the signs of a serious problem take more time to appear. Therefore, please watch for the warning signs listed below.  Home Care:  1. Rest today and avoid strenuous activity.  2. Take any prescribed medicine as directed.  Follow Up  with your doctor or this facility as instructed or if you do not start to feel better within 24 hours.  Get Prompt Medical Attention  if any of the following occur:   A change in the type of pain: if it feels different, becomes more severe, lasts longer, or begins to spread into your shoulder, arm, neck, jaw or back   Shortness of breath or increased pain with breathing   Cough with dark colored sputum (phlegm) or blood   Weakness, dizziness, or fainting   Fever of 100.37F (38C) or higher, or as directed by your healthcare provider   Swelling, pain or redness in one leg   [address], [address], [place], [place] [address]. All rights reserved. This information is not intended as a substitute for professional medical care. Always follow your healthcare professional's instructions.      Chest Pain, Noncardiac    Based on your visit today, the exact cause of your chest pain is not certain. Your condition does not seem serious and your pain does not appear to be coming from your heart. However, sometimes the signs of a serious problem take more time to appear. Therefore, please watch for the warning signs listed below.  Home Care:  3. Rest today and avoid strenuous activity.  4. Take any prescribed medicine as directed.  Follow Up  with your doctor or this facility as instructed or if you do not start to feel  better within 24 hours.  Get Prompt Medical Attention  if any of the following occur:   A change in the type of pain: if it feels different, becomes more severe, lasts longer, or begins to spread into your shoulder, arm, neck, jaw or back   Shortness of breath or increased pain with breathing   Cough with dark colored sputum (phlegm) or blood   Weakness, dizziness, or fainting   Fever of 100.37F (38C) or higher, or as directed by your healthcare provider   Swelling, pain or redness in one leg   [address], [address] [place], [place] [address]. All rights reserved. This information is not intended as a substitute for professional medical care. Always follow your healthcare professional's instructions.

## 2012-10-09 NOTE — Progress Notes (Signed)
[facility] PROGRESS NOTE     Date Time:   10/09/2012    11:30 AM  Patient Name: [name]    LOS: 2 days     Subjective:  No events. Pt c/o left upper chest discomfort which has been constant since Friday but occ worsens without clear trigger. Better with Morphine.    Scheduled Meds:       allopurinol 300 mg Oral Daily   aspirin 81 mg Oral Daily   citalopram 20 mg Oral Daily   [COMPLETED] diphenhydrAMINE 50 mg Oral Once   levothyroxine 25 mcg Oral Daily at 0600   metoprolol 12.5 mg Oral BID   pantoprazole 40 mg Oral Daily   polyethylene glycol 17 g Oral Daily   pravastatin 40 mg Oral Daily   [COMPLETED] predniSONE 50 mg Oral Q6H   warfarin 4 mg Oral Daily at 1800   [DISCONTINUED] lisinopril 20 mg Oral QPM     Continuous Infusions:     PRN Meds:  acetaminophen, morphine, naloxone, nitroglycerin, ondansetron    Objective:  Physical Exam:  BP 128/56  Pulse 55  Temp 96.9 F (36.1 C) (Oral)  Resp 18  Ht 1.524 m (5')  Wt 101.197 kg (223 lb 1.6 oz)  BMI 43.57 kg/m2  SpO2 91% No intake or output data in the 24 hours ending 10/09/12 1130  General: awake, alert, oriented x 3; no acute distress.  Cardiovascular:  regular rate and rhythm, no murmurs, rubs or gallops, pain not reproducible  Lungs:    clear to auscultation bilaterally, without wheezing, rhonchi, or rales  Abdomen: soft, non-tender, non-distended, no hepatosplenomegaly, normoactive bowel sounds  Extremities:  no clubbing or cyanosis, trace non-pitting edema     Lab Review:   No results found for this basename: WBC:3,HGB:3,HCT:3,PLT:3 in the last 72 hours  Recent Labs   Basename 10/07/12 1718 10/07/12 1027 10/07/12 0421    TROPI 0.02 0.00 0.02    ISTATTROPONI -- -- --    CK 22* 21* 22*    CKMB -- -- --    BNP -- -- --    No results found for this basename: NA:3,K:3,CO2:3,BUN:3,CREAT:3,GLU:3,MG:3 in the last 72 hours  Recent Labs   [facility] 10/07/12 0421    INR 2.2*    TSH --    CHOL --    TRIG --    HDL --    LDL --        Telemetry: SR,  SB    Imaging:  Radiology Results (24 Hour)     ** No Results found for the last 24 hours. **          Assessment/Plan:   1. Atypical CP: with known h/o CAD though current discomfort is not likely cardiac given duration of symptoms with negative enzymes. Recent negative nuclear stress test last month. Cath Feb 2012 without sig CAD. Pt with CP syndrome - fibromyalgia, chronic pain specialist. Awaiting CT today then likely d/c if unremarkable. If cannot be done today, would consider D/C and do as outpt. Pre meds for dye given.    2. PE: on coumadin and therapeutic.    Signed by: Cheri Kearns, MD, [facility] [phone] or [phone] (M-F from 8AM-5PM)  Office [phone]

## 2012-10-09 NOTE — Discharge Summary (Signed)
Discharge Summary    Date:10/09/2012   Patient Name: [name]  Attending Physician: Jaci Standard, MD    Date of Admission:   10/07/2012    Date of Discharge:   10/09/2012    Admitting Diagnosis:   Chest pain.    Discharge Dx:   Principal Diagnosis: Chest pain  Active Hospital Problems    Diagnosis POA   . Principal Problem: Chest pain possibly costochondritis  Yes   . Coronary artery disease Yes   . HTN (hypertension) Yes   . HLD (hyperlipidemia) Yes   . Gout Yes   . Nephrolithiasis Yes   . History of pulmonary embolism Yes   . Hypothyroid Yes   . Obesity Yes   . Depression Yes   . GERD (gastroesophageal reflux disease) Yes   . Anemia Yes   . OSA (obstructive sleep apnea) Yes      Resolved Hospital Problems    Diagnosis POA   No resolved problems to display.         Treatment Team:   Treatment Team:   Attending Provider: Jaci Standard, MD     Procedures performed:   Radiology: all results from this admission  US Venous Duplex Doppler Leg Bilateral    10/07/2012   No evidence of deep venous thrombosis involving right and left lower extremities.  Pennelope Bracken, MD  10/07/2012 4:40 PM       Hospital Course:   Reason for admission / HPI: Per Dr. Joni Fears on admission: "[name] is a 62 y.o. female who presents to the hospital with chest pain.   62 yo WF excellent historian with history of CAD/MI s/p stent x6 (LAD and RCA, most recently Nov 2010), HTN, HLD, PE May 2014 on Coumadin, obesity, hypothyroidism, gout, anemia, h/o kidney stones presents with chest pain. Pt lives in [place]. Was driving with friend to [place], [place] and on way home this afternoon ~3pm when noted onset of left sided chest heaviness radiating up to neck and then eventually to left arm. Pulled over and vomited. Continued to drive. Pulled over to vomit again. Dropped friend off and went to nearest hospital which was [place]. She thought this was indigestion which is why she continued driving. However now she thinks it does feel similar to  her MI in May 2010. She states CP was heaviness, occasionally sharp. It would lighten, but never fully go away and it was always there. The previous 2 days, she felt tired, slept more often. Otherwise no recent diarrhea, constipation, dysuria, LE edema, othopnea, PNA, other travel, poor PO, med non-adherence.   Cards is Dr. Alyce Pagan at [facility]. Had a chemical stress test July 2014 that was "unremarkable"   PE was May 2014, unprovoked, currently on Coumadin x6 months. Admitted x1 week where also discovered to have anemia and received PRBC transfusion. Had an EGD which was unremarkable. Unable to do colo as on Coumadin. But had a colo Nov 2013 which was reportedly normal. Had mammogram within the year, unremarkable. Has hysterectomy for AUB after delivery of child."    Hospital Course: Patient as admitted to rule out ACS.  Enzymes were negative and was seen by cardiology consult.  Though her symptoms were not entirely typical, given her extensive history she was placed on a treadmill, developed symptoms though test and was negative for ischemia.  With her persistent symptoms, she was scheduled for a CT angiogram but it could not be performed during the hospitalization given availability of staff.  Upon further monitoring and ensuring stability of condition, she was deemed likely not cardiogenic in terms of her current symptoms.  She was given Rx on oxycodone on discharge and scheduled to follow up    Patient condition at discharge: stable  Today:  BP 128/56  Pulse 55  Temp 96.9 F (36.1 C) (Oral)  Resp 18  Ht 1.524 m (5')  Wt 101.197 kg (223 lb 1.6 oz)  BMI 43.57 kg/m2  SpO2 91%  Ranges for the last 24 hours:  Temp:  [96 F (35.6 C)-97.7 F (36.5 C)] 96.9 F (36.1 C)  Heart Rate:  [52-63] 55   Resp Rate:  [16-18] 18   BP: (126-140)/(56-97) 128/56 mmHg    Last set of labs     Lab 10/07/12 1718 10/07/12 1027 10/07/12 0421   CK 22* 21* 22*   TROPI 0.02 0.00 0.02   TROPT -- -- --   CKMBINDEX --  -- --       Recommendations & Instructions for providers after discharge:  Follow up with cardiology.  Take the oxycodone as needed.  If you need a lot consider a stool softener because it could cause constipation.    Discharge Instructions:     Follow-up Information     Follow up with Mauroner, Micheal Likens., MD. In 2 weeks.    Contact information:    [facility] [address]  [place] [address]  [phone]          Follow up with Darrol Poke, MD. In 1 week.    Contact information:    [address]  [place] [address]  [phone]                Discharge Diet: Cardiac Diet        Activity/Weight bearing status: as tolerated  Disposition:  Conversion Visit     Discharge Medication List       As of 10/09/2012  1:36 PM      TAKE these medications           allopurinol 300 MG tablet    Dose: 300 mg    Commonly known as: ZYLOPRIM    Take 300 mg by mouth daily.        aspirin 81 MG chewable tablet    Dose: 81 mg    Chew 81 mg by mouth daily.        citalopram 20 MG tablet    Dose: 20 mg    Commonly known as: CeleXA    Take 20 mg by mouth daily.        Fenofibric Acid 135 MG Cpdr    Take by mouth.        levothyroxine 25 MCG tablet    Dose: 25 mcg    Commonly known as: SYNTHROID, LEVOTHROID    Take 25 mcg by mouth.        metoprolol 25 MG tablet    Dose: 12.5 mg    Commonly known as: LOPRESSOR    Take 12.5 mg by mouth 2 (two) times daily.        oxyCODONE 5 MG immediate release tablet    Dose: 5 mg    Commonly known as: ROXICODONE    Take 1 tablet (5 mg total) by mouth every 4 (four) hours as needed for Pain.        pantoprazole 40 MG tablet    Dose: 40 mg    Commonly known as: PROTONIX  Take 40 mg by mouth daily.        pravastatin 40 MG tablet    Dose: 40 mg    Commonly known as: PRAVACHOL    Take 40 mg by mouth daily.        quinapril 20 MG tablet    Dose: 20 mg    Commonly known as: ACCUPRIL    Take 20 mg by mouth nightly.        warfarin 4 MG tablet    Dose: 4 mg    Commonly known as: COUMADIN    Take 4 mg by  mouth daily.           Minutes spent coordinating discharge and reviewing discharge plan: 35 minutes      Signed by: Jaci Standard, MD

## 2012-10-09 NOTE — Plan of Care (Signed)
Pt medicated with Morphine 2 mg 1 x during shift, Medicated with Zofran 4 mg IV for c/o nausea.  Pt premedicated with Prednisone and Benadryl for CT scan and iodine allergy.  NSR on tele,  Will cont to monitor

## 2012-10-09 NOTE — Progress Notes (Signed)
SW spoke to patient about discharge plan. Pt lives alone but insists on having a strong network of family and friends. Niece will pick her up from hospital. Patient has no needs.       Geryl Rankins, MSW  Clinical Social Worker  [place], [place]  [phone]

## 2012-10-09 NOTE — Plan of Care (Signed)
Telemetry:NSR. HR: 50-60s. A&Ox4. VS: WNL. IndpendentPt complains of chest/back and leg pain- administered morphine IV. Unable to proceed with Cardiac CT scan due to operating room being closed-according to MD ok to have test as outpt.Bed lowered, call bell in reach, will continue to monitor pt care.      Pt to be discharged to home. Pt understanding of discharge instructions and medication.

## 2013-07-04 DIAGNOSIS — I2699 Other pulmonary embolism without acute cor pulmonale: Secondary | ICD-10-CM | POA: Insufficient documentation

## 2014-07-29 DIAGNOSIS — Z955 Presence of coronary angioplasty implant and graft: Secondary | ICD-10-CM

## 2014-07-29 HISTORY — DX: Presence of coronary angioplasty implant and graft: Z95.5

## 2014-07-29 HISTORY — PX: CORONARY ANGIOPLASTY WITH STENT PLACEMENT: SHX49

## 2015-03-20 ENCOUNTER — Observation Stay: Payer: Medicare Other | Admitting: Internal Medicine

## 2015-03-20 ENCOUNTER — Observation Stay
Admission: EM | Admit: 2015-03-20 | Discharge: 2015-03-21 | Disposition: A | Discharge status: Home / Self Care | Payer: Medicare Other | Attending: Hospitalist | Admitting: Hospitalist

## 2015-03-20 ENCOUNTER — Emergency Department: Payer: Medicare Other

## 2015-03-20 ENCOUNTER — Observation Stay: Payer: Medicare Other

## 2015-03-20 DIAGNOSIS — M25561 Pain in right knee: Secondary | ICD-10-CM | POA: Insufficient documentation

## 2015-03-20 DIAGNOSIS — Z86711 Personal history of pulmonary embolism: Secondary | ICD-10-CM | POA: Insufficient documentation

## 2015-03-20 DIAGNOSIS — G8929 Other chronic pain: Secondary | ICD-10-CM | POA: Insufficient documentation

## 2015-03-20 DIAGNOSIS — Z6841 Body Mass Index (BMI) 40.0 and over, adult: Secondary | ICD-10-CM | POA: Insufficient documentation

## 2015-03-20 DIAGNOSIS — M940 Chondrocostal junction syndrome [Tietze]: Principal | ICD-10-CM | POA: Insufficient documentation

## 2015-03-20 DIAGNOSIS — D649 Anemia, unspecified: Secondary | ICD-10-CM | POA: Insufficient documentation

## 2015-03-20 DIAGNOSIS — Z7982 Long term (current) use of aspirin: Secondary | ICD-10-CM | POA: Insufficient documentation

## 2015-03-20 DIAGNOSIS — I160 Hypertensive urgency: Secondary | ICD-10-CM | POA: Insufficient documentation

## 2015-03-20 DIAGNOSIS — M109 Gout, unspecified: Secondary | ICD-10-CM | POA: Insufficient documentation

## 2015-03-20 DIAGNOSIS — R9431 Abnormal electrocardiogram [ECG] [EKG]: Secondary | ICD-10-CM | POA: Insufficient documentation

## 2015-03-20 DIAGNOSIS — Z8249 Family history of ischemic heart disease and other diseases of the circulatory system: Secondary | ICD-10-CM | POA: Insufficient documentation

## 2015-03-20 DIAGNOSIS — Z79899 Other long term (current) drug therapy: Secondary | ICD-10-CM | POA: Insufficient documentation

## 2015-03-20 DIAGNOSIS — E785 Hyperlipidemia, unspecified: Secondary | ICD-10-CM | POA: Insufficient documentation

## 2015-03-20 DIAGNOSIS — I2510 Atherosclerotic heart disease of native coronary artery without angina pectoris: Secondary | ICD-10-CM | POA: Insufficient documentation

## 2015-03-20 DIAGNOSIS — G629 Polyneuropathy, unspecified: Secondary | ICD-10-CM | POA: Insufficient documentation

## 2015-03-20 DIAGNOSIS — E079 Disorder of thyroid, unspecified: Secondary | ICD-10-CM | POA: Insufficient documentation

## 2015-03-20 DIAGNOSIS — Z96651 Presence of right artificial knee joint: Secondary | ICD-10-CM | POA: Insufficient documentation

## 2015-03-20 DIAGNOSIS — G4733 Obstructive sleep apnea (adult) (pediatric): Secondary | ICD-10-CM | POA: Insufficient documentation

## 2015-03-20 DIAGNOSIS — I252 Old myocardial infarction: Secondary | ICD-10-CM | POA: Insufficient documentation

## 2015-03-20 DIAGNOSIS — Z955 Presence of coronary angioplasty implant and graft: Secondary | ICD-10-CM | POA: Insufficient documentation

## 2015-03-20 DIAGNOSIS — Z7902 Long term (current) use of antithrombotics/antiplatelets: Secondary | ICD-10-CM | POA: Insufficient documentation

## 2015-03-20 DIAGNOSIS — N179 Acute kidney failure, unspecified: Secondary | ICD-10-CM | POA: Insufficient documentation

## 2015-03-20 DIAGNOSIS — E860 Dehydration: Secondary | ICD-10-CM | POA: Insufficient documentation

## 2015-03-20 DIAGNOSIS — R079 Chest pain, unspecified: Secondary | ICD-10-CM

## 2015-03-20 DIAGNOSIS — K219 Gastro-esophageal reflux disease without esophagitis: Secondary | ICD-10-CM | POA: Insufficient documentation

## 2015-03-20 LAB — PT/INR
PT INR: 0.9 (ref 0.9–1.1)
PT: 12.4 s — ABNORMAL LOW (ref 12.6–15.0)

## 2015-03-20 LAB — CBC AND DIFFERENTIAL
Basophils Absolute Automated: 0.01 10*3/uL (ref 0.00–0.20)
Basophils Automated: 0 %
Eosinophils Absolute Automated: 0.12 10*3/uL (ref 0.00–0.70)
Eosinophils Automated: 2 %
Hematocrit: 33.6 % — ABNORMAL LOW (ref 37.0–47.0)
Hgb: 10.2 g/dL — ABNORMAL LOW (ref 12.0–16.0)
Immature Granulocytes Absolute: 0.02 10*3/uL
Immature Granulocytes: 0 %
Lymphocytes Absolute Automated: 1.55 10*3/uL (ref 0.50–4.40)
Lymphocytes Automated: 31 %
MCH: 25.5 pg — ABNORMAL LOW (ref 28.0–32.0)
MCHC: 30.4 g/dL — ABNORMAL LOW (ref 32.0–36.0)
MCV: 84 fL (ref 80.0–100.0)
MPV: 10.8 fL (ref 9.4–12.3)
Monocytes Absolute Automated: 0.64 10*3/uL (ref 0.00–1.20)
Monocytes: 13 %
Neutrophils Absolute: 2.71 10*3/uL (ref 1.80–8.10)
Neutrophils: 54 %
Nucleated RBC: 0 /100 WBC (ref 0–1)
Platelets: 240 10*3/uL (ref 140–400)
RBC: 4 10*6/uL — ABNORMAL LOW (ref 4.20–5.40)
RDW: 20 % — ABNORMAL HIGH (ref 12–15)
WBC: 5.03 10*3/uL (ref 3.50–10.80)

## 2015-03-20 LAB — BASIC METABOLIC PANEL
Anion Gap: 11 (ref 5.0–15.0)
BUN: 23 mg/dL — ABNORMAL HIGH (ref 7–19)
CO2: 20 mEq/L — ABNORMAL LOW (ref 22–29)
Calcium: 9.1 mg/dL (ref 8.5–10.5)
Chloride: 110 mEq/L (ref 100–111)
Creatinine: 1.2 mg/dL — ABNORMAL HIGH (ref 0.6–1.0)
Glucose: 93 mg/dL (ref 70–100)
Potassium: 4.5 mEq/L (ref 3.5–5.1)
Sodium: 141 mEq/L (ref 136–145)

## 2015-03-20 LAB — CELL MORPHOLOGY
Cell Morphology: NORMAL
Platelet Estimate: NORMAL

## 2015-03-20 LAB — GFR: EGFR: 45.1

## 2015-03-20 LAB — IHS D-DIMER: D-Dimer: 0.5 ug/mL FEU (ref 0.00–0.51)

## 2015-03-20 LAB — TROPONIN I: Troponin I: <0.01 ng/mL (ref 0.00–0.09)

## 2015-03-20 MED ORDER — SODIUM CHLORIDE 0.9 % IV SOLN
INTRAVENOUS | Status: DC
Start: 2015-03-20 — End: 2015-03-21

## 2015-03-20 MED ORDER — NITROGLYCERIN 0.4 MG SL SUBL
0.4000 mg | SUBLINGUAL_TABLET | SUBLINGUAL | Status: DC | PRN
Start: 2015-03-20 — End: 2015-03-21

## 2015-03-20 MED ORDER — ONDANSETRON HCL 4 MG/2ML IJ SOLN
4.0000 mg | Freq: Three times a day (TID) | INTRAMUSCULAR | Status: DC | PRN
Start: 2015-03-20 — End: 2015-03-21
  Administered 2015-03-21: 4 mg via INTRAVENOUS
  Filled 2015-03-20: qty 2

## 2015-03-20 MED ORDER — FAMOTIDINE 20 MG PO TABS
20.0000 mg | ORAL_TABLET | Freq: Two times a day (BID) | ORAL | Status: DC
Start: 2015-03-20 — End: 2015-03-21
  Administered 2015-03-20 – 2015-03-21 (×2): 20 mg via ORAL
  Filled 2015-03-20 (×2): qty 1

## 2015-03-20 MED ORDER — ASPIRIN 81 MG PO CHEW
162.0000 mg | CHEWABLE_TABLET | Freq: Once | ORAL | Status: AC
Start: 2015-03-20 — End: 2015-03-20
  Administered 2015-03-20: 162 mg via ORAL
  Filled 2015-03-20: qty 2

## 2015-03-20 MED ORDER — ACETAMINOPHEN 500 MG PO TABS
500.0000 mg | ORAL_TABLET | ORAL | Status: DC | PRN
Start: 2015-03-20 — End: 2015-03-21
  Administered 2015-03-20: 500 mg via ORAL
  Filled 2015-03-20: qty 1

## 2015-03-20 MED ORDER — METOPROLOL SUCCINATE ER 25 MG PO TB24
25.0000 mg | ORAL_TABLET | Freq: Two times a day (BID) | ORAL | Status: DC
Start: 2015-03-20 — End: 2015-03-21
  Administered 2015-03-20 – 2015-03-21 (×2): 25 mg via ORAL
  Filled 2015-03-20 (×2): qty 1

## 2015-03-20 MED ORDER — ISOSORBIDE MONONITRATE ER 30 MG PO TB24
30.0000 mg | ORAL_TABLET | Freq: Every day | ORAL | Status: DC
Start: 2015-03-21 — End: 2015-03-20

## 2015-03-20 MED ORDER — IBUPROFEN 600 MG PO TABS
600.0000 mg | ORAL_TABLET | Freq: Once | ORAL | Status: DC
Start: 2015-03-20 — End: 2015-03-20

## 2015-03-20 MED ORDER — FERROUS SULFATE 324 (65 FE) MG PO TBEC
324.0000 mg | DELAYED_RELEASE_TABLET | Freq: Two times a day (BID) | ORAL | Status: DC
Start: 2015-03-20 — End: 2015-03-21
  Administered 2015-03-20 – 2015-03-21 (×2): 324 mg via ORAL
  Filled 2015-03-20 (×2): qty 1

## 2015-03-20 MED ORDER — SODIUM CHLORIDE 0.9 % IJ SOLN
3.0000 mL | Freq: Three times a day (TID) | INTRAMUSCULAR | Status: DC
Start: 2015-03-20 — End: 2015-03-21
  Administered 2015-03-20: 3 mL

## 2015-03-20 MED ORDER — MORPHINE SULFATE 4 MG/ML IJ/IV SOLN (WRAP)
4.0000 mg | Freq: Once | Status: AC
Start: 2015-03-20 — End: 2015-03-20
  Administered 2015-03-20: 4 mg via INTRAVENOUS
  Filled 2015-03-20: qty 1

## 2015-03-20 MED ORDER — MORPHINE SULFATE 4 MG/ML IJ/IV SOLN (WRAP)
4.0000 mg | Status: DC | PRN
Start: 2015-03-20 — End: 2015-03-21
  Administered 2015-03-20 – 2015-03-21 (×3): 4 mg via INTRAVENOUS
  Filled 2015-03-20 (×3): qty 1

## 2015-03-20 MED ORDER — CLOPIDOGREL BISULFATE 75 MG PO TABS
75.0000 mg | ORAL_TABLET | Freq: Every evening | ORAL | Status: DC
Start: 2015-03-20 — End: 2015-03-21
  Administered 2015-03-20: 21:00:00 75 mg via ORAL
  Filled 2015-03-20: qty 1

## 2015-03-20 MED ORDER — FENOFIBRATE MICRONIZED 67 MG PO CAPS
134.0000 mg | ORAL_CAPSULE | Freq: Every morning | ORAL | Status: DC
Start: 2015-03-21 — End: 2015-03-21
  Administered 2015-03-21: 134 mg via ORAL
  Filled 2015-03-20: qty 2

## 2015-03-20 MED ORDER — NITROGLYCERIN 2 % TD OINT
1.0000 [in_us] | TOPICAL_OINTMENT | Freq: Once | TRANSDERMAL | Status: AC
Start: 2015-03-20 — End: 2015-03-20
  Administered 2015-03-20: 1 [in_us] via TOPICAL
  Filled 2015-03-20: qty 1

## 2015-03-20 MED ORDER — ASPIRIN 81 MG PO TBEC
81.0000 mg | DELAYED_RELEASE_TABLET | Freq: Every day | ORAL | Status: DC
Start: 2015-03-21 — End: 2015-03-21
  Administered 2015-03-21: 81 mg via ORAL
  Filled 2015-03-20: qty 1

## 2015-03-20 MED ORDER — NITROGLYCERIN 0.4 MG SL SUBL
0.4000 mg | SUBLINGUAL_TABLET | SUBLINGUAL | Status: AC
Start: 2015-03-20 — End: 2015-03-20
  Administered 2015-03-20 (×2): 0.4 mg via SUBLINGUAL
  Filled 2015-03-20: qty 1

## 2015-03-20 MED ORDER — ALLOPURINOL 300 MG PO TABS
300.0000 mg | ORAL_TABLET | Freq: Every evening | ORAL | Status: DC
Start: 2015-03-20 — End: 2015-03-21
  Administered 2015-03-20: 300 mg via ORAL
  Filled 2015-03-20: qty 1

## 2015-03-20 MED ORDER — CITALOPRAM HYDROBROMIDE 20 MG PO TABS
40.0000 mg | ORAL_TABLET | Freq: Every day | ORAL | Status: DC
Start: 2015-03-21 — End: 2015-03-21
  Administered 2015-03-21: 40 mg via ORAL
  Filled 2015-03-20: qty 2

## 2015-03-20 MED ORDER — ENOXAPARIN SODIUM 40 MG/0.4ML SC SOLN
40.0000 mg | Freq: Every day | SUBCUTANEOUS | Status: DC
Start: 2015-03-21 — End: 2015-03-21
  Administered 2015-03-21: 40 mg via SUBCUTANEOUS
  Filled 2015-03-20: qty 0.4

## 2015-03-20 MED ORDER — PANTOPRAZOLE SODIUM 40 MG PO TBEC
40.0000 mg | DELAYED_RELEASE_TABLET | Freq: Every day | ORAL | Status: DC
Start: 2015-03-20 — End: 2015-03-21
  Administered 2015-03-21: 40 mg via ORAL
  Filled 2015-03-20: qty 1

## 2015-03-20 MED ORDER — ONDANSETRON HCL 4 MG/2ML IJ SOLN
4.0000 mg | Freq: Once | INTRAMUSCULAR | Status: AC
Start: 2015-03-20 — End: 2015-03-20
  Administered 2015-03-20: 4 mg via INTRAVENOUS
  Filled 2015-03-20: qty 2

## 2015-03-20 MED ORDER — PRAVASTATIN SODIUM 20 MG PO TABS
40.0000 mg | ORAL_TABLET | Freq: Every day | ORAL | Status: DC
Start: 2015-03-21 — End: 2015-03-21
  Administered 2015-03-21: 40 mg via ORAL
  Filled 2015-03-20: qty 2

## 2015-03-20 MED ORDER — ASPIRIN 81 MG PO CHEW
81.0000 mg | CHEWABLE_TABLET | Freq: Every day | ORAL | Status: DC
Start: 2015-03-21 — End: 2015-03-20

## 2015-03-20 MED ORDER — NALOXONE HCL 0.4 MG/ML IJ SOLN (WRAP)
0.2000 mg | INTRAMUSCULAR | Status: DC | PRN
Start: 2015-03-20 — End: 2015-03-21

## 2015-03-20 MED ORDER — GABAPENTIN 100 MG PO CAPS
200.0000 mg | ORAL_CAPSULE | Freq: Two times a day (BID) | ORAL | Status: DC
Start: 2015-03-20 — End: 2015-03-21
  Administered 2015-03-20 – 2015-03-21 (×2): 200 mg via ORAL
  Filled 2015-03-20 (×2): qty 2

## 2015-03-20 MED ORDER — NITROGLYCERIN 2 % TD OINT
0.5000 [in_us] | TOPICAL_OINTMENT | Freq: Four times a day (QID) | TRANSDERMAL | Status: DC
Start: 2015-03-21 — End: 2015-03-21
  Administered 2015-03-21: 0.5 [in_us] via TOPICAL
  Filled 2015-03-20: qty 1

## 2015-03-20 NOTE — ED Provider Notes (Signed)
Physician/Midlevel provider first contact with patient: 03/20/15 1724         [facility] EMERGENCY DEPARTMENT HISTORY AND PHYSICAL EXAM    Patient Name: [name], [name]  Encounter Date:  03/20/2015  Rendering Provider: Clovis Riley, MD  Patient DOB:  1950/05/10  MRN:  [id number]    History of Presenting Illness     Historian: patient     65 y.o. female with h/o GERD, HLD, HTN, MI, and cardiac stent placement p/w gradual onset, persistent burning midsternal chest pain radiating to between the scapula and to the left shoulder onset 30 minutes PTA while driving. Associated with nausea. Chest pain and nausea are similar to both her past PE and MI Additionally, she developed SOB while walking through the parking lot to get to the ED. She also complains of posterior calf pain radiating to the groin onset 2 days ago; pain is worse with bending. She has taken Gabapentin without relief of leg pain. Patient took 81 mg ASA this morning. Patient notes she has had 7 stents placed and had stents collapse before.      Cardiologist: Sinda Du, MD  PMD:  Mauroner, Micheal Likens., MD    Past Medical History     Past Medical History   Diagnosis Date   . Seasonal allergic rhinitis    . Anemia    . Encounter for blood transfusion    . Depression    . Gastroesophageal reflux disease    . Hyperlipidemia    . Hypertension    . Myocardial infarction    . Disorder of thyroid        Past Surgical History     Past Surgical History   Procedure Laterality Date   . Joint replacement     . Abdominal surgery     . Back surgery     . Spine surgery     . Hernia repair     . Hysterectomy         Family History     Family History   Problem Relation Age of Onset   . Heart attack Sister    . Heart attack Maternal Aunt    . Heart attack Paternal Aunt        Social History     Social History     Social History   . Marital Status: Divorced     Spouse Name: N/A   . Number of Children: N/A   . Years of Education: N/A     Social History Main Topics   .  Smoking status: Never Smoker    . Smokeless tobacco: Not on file   . Alcohol Use: No   . Drug Use: No   . Sexual Activity: Not on file     Other Topics Concern   . Not on file     Social History Narrative       Home Medications     Home medications reviewed by ED MD     Current Discharge Medication List      CONTINUE these medications which have NOT CHANGED    Details   allopurinol (ZYLOPRIM) 300 MG tablet Take 300 mg by mouth nightly.         aspirin 81 MG chewable tablet Chew 81 mg by mouth daily.      Choline Fenofibrate (TRILIPIX) 135 MG capsule Take 135 mg by mouth every evening.      citalopram (CELEXA) 40 MG tablet Take 40 mg by mouth  daily.      clopidogrel (PLAVIX) 75 mg tablet Take 75 mg by mouth nightly.         ferrous sulfate 325 (65 FE) MG tablet Take 325 mg by mouth 2 (two) times daily.         gabapentin (NEURONTIN) 100 MG capsule Take 200 mg by mouth 2 (two) times daily.         isosorbide mononitrate (IMDUR) 30 MG 24 hr tablet Take 30 mg by mouth daily.      levothyroxine (SYNTHROID, LEVOTHROID) 75 MCG tablet Take 75 mcg by mouth Once a day at 6:00am.      metoprolol XL (TOPROL-XL) 25 MG 24 hr tablet Take 25 mg by mouth 2 (two) times daily.      pantoprazole (PROTONIX) 40 MG tablet Take 40 mg by mouth 2 (two) times daily.         pravastatin (PRAVACHOL) 40 MG tablet Take 40 mg by mouth daily.      PROAIR HFA 108 (90 Base) MCG/ACT inhaler              Review of Systems     Constitutional:  No fever  Eyes: No discharge   ENT: No ST  CV:  + midsternal chest pain radiating to between the scapula and to the left shoulder or cough  Resp:  + SOB  GI: + nausea No abd pain, No V/D  GU: No dysuria  MS: + posterior calf pain radiating to the groin  Skin: No rash  Neuro:  No HA  Psych:  No behavior changes  All other systems reviewed and negative    Physical Exam     BP 144/66 mmHg  Pulse 56  Temp(Src) 97.7 F (36.5 C) (Axillary)  Resp 20  Ht 5' (1.524 m)  Wt 106.595 kg  BMI 45.90 kg/m2  SpO2  93%    CONSTITUTIONAL   Vital Signs Reviewed,   HEAD   Atraumatic, Normocephalic.  EYES   Eyes are normal to inspection, No discharge from eyes.  ENT    Mucus membranes moist.  No pharyngeal edema.    NECK   Normal inspection and Normal ROM,  RESPIRATORY CHEST    No respiratory distress. Decreased breath sounds bilaterally.  CARDIOVASCULAR   RRR, Heart sounds normal.  ABDOMEN   No distension. Normal bowel sounds.  Non-tender.  No peritoneal signs.  BACK   There is no tenderness to palpation, Normal inspection. + straight leg raise on the right.   UPPER EXTREMITY   Inspection normal, No cyanosis.  LOWER EXTREMITY  No cyanosis,Trace pedal edema bilaterally. Tenderness right calf and hamstrings.  NEURO   No focal motor deficits, No focal sensory deficits.  SKIN   Skin is warm, Skin is dry, Skin is normal color.  PSYCHIATRIC   Oriented X 3. Normal affect, Normal concentration.    ED Medications Administered     ED Medication Orders     Start Ordered     Status Ordering Provider    03/20/15 1858 03/20/15 1858  nitroglycerin (NITRO-BID) 2 % ointment 1 inch   Once     Route: Topical  Ordered Dose: 1 inch     Last MAR action:  Given Melika Reder W    03/20/15 1826 03/20/15 1825  morphine injection 4 mg   Once     Route: Intravenous  Ordered Dose: 4 mg     Last MAR action:  Given Helios Kohlmann W    03/20/15 1826  03/20/15 1825  ondansetron (ZOFRAN) injection 4 mg   Once     Route: Intravenous  Ordered Dose: 4 mg     Last MAR action:  Given Tyah Acord W    03/20/15 1816 03/20/15 1815     Once,   Status:  Discontinued     Route: Oral  Ordered Dose: 600 mg     Discontinued Jayton Popelka W    03/20/15 1743 03/20/15 1742  aspirin chewable tablet 162 mg   Once     Route: Oral  Ordered Dose: 162 mg     Last MAR action:  Given Alainna Stawicki W    03/20/15 1743 03/20/15 1742  nitroglycerin (NITROSTAT) SL tablet 0.4 mg   Every 5 min     Route: Sublingual  Ordered Dose: 0.4 mg     Last MAR action:  Hold Tiani Stanbery W           Orders Placed During This Encounter     Orders Placed This Encounter   Procedures   . Chest AP Portable   . XR Knee 3 View Right   . Basic Metabolic Panel   . CBC and differential   . Troponin I   . GFR   . Prothrombin time/INR   . D-Dimer   . Cell MorpHology   . Hemoglobin A1C   . CBC without differential   . Basic Metabolic Panel   . Troponin I   . Diet NPO time specified   . Diet cardiac   . Cardiac monitoring (ED ONLY)   . Vital signs   . Pulse Oximetry   . Ambulate patient   . Nasal Cannula Low-Flow (5 LPM or less)   . Intermittent Pneumatic Compression   . Telemetry 24 Hour Protocol   . Full Code   . ECG 12 lead   . ECG 12 lead (Electrocardiogram)   . ECG 12 lead   . Saline lock IV   . Saline lock IV   . Place  for Observation Services   . Einar Gip ED Bed Request       Diagnostic Study Results     The results of the diagnostic studies below were reviewed by the ED provider:    Labs  Results     Procedure Component Value Units Date/Time    Cell MorpHology [[id number]]  (Abnormal) Collected:  03/20/15 1750     Cell Morphology: Normal Updated:  03/20/15 1822     Anisocytosis =1+ (A)      Platelet Estimate Normal     CBC and differential [[id number]]  (Abnormal) Collected:  03/20/15 1750    Specimen Information:  Blood from Blood Updated:  03/20/15 1822     WBC 5.03 x10 3/uL      Hgb 10.2 (L) g/dL      Hematocrit 78.2 (L) %      Platelets 240 x10 3/uL      RBC 4.00 (L) x10 6/uL      MCV 84.0 fL      MCH 25.5 (L) pg      MCHC 30.4 (L) g/dL      RDW 20 (H) %      MPV 10.8 fL      Neutrophils 54 %      Lymphocytes Automated 31 %      Monocytes 13 %      Eosinophils Automated 2 %      Basophils Automated 0 %      Immature  Granulocyte 0 %      Nucleated RBC 0 /100 WBC      Neutrophils Absolute 2.71 x10 3/uL      Abs Lymph Automated 1.55 x10 3/uL      Abs Mono Automated 0.64 x10 3/uL      Abs Eos Automated 0.12 x10 3/uL      Absolute Baso Automated 0.01 x10 3/uL      Absolute Immature Granulocyte 0.02 x10 3/uL      D-Dimer [[id number]] Collected:  03/20/15 1750     D-Dimer 0.50 ug/mL FEU Updated:  03/20/15 1818    Troponin I [[id number]] Collected:  03/20/15 1750    Specimen Information:  Blood Updated:  03/20/15 1817     Troponin I <0.01 ng/mL     Prothrombin time/INR [[id number]]  (Abnormal) Collected:  03/20/15 1750    Specimen Information:  Blood Updated:  03/20/15 1815     PT 12.4 (L) sec      PT INR 0.9      PT Anticoag. Given Within 48 hrs. None     Basic Metabolic Panel [[id number]]  (Abnormal) Collected:  03/20/15 1750    Specimen Information:  Blood Updated:  03/20/15 1811     Glucose 93 mg/dL      BUN 23 (H) mg/dL      Creatinine 1.2 (H) mg/dL      Calcium 9.1 mg/dL      Sodium 629 mEq/L      Potassium 4.5 mEq/L      Chloride 110 mEq/L      CO2 20 (L) mEq/L      Anion Gap 11.0     GFR [[id number]] Collected:  03/20/15 1750     EGFR 45.1 Updated:  03/20/15 1811          Radiologic Studies  Radiology Results (24 Hour)     Procedure Component Value Units Date/Time    Chest AP Portable [[id number]] Collected:  03/20/15 1734    Order Status:  Completed Updated:  03/20/15 1739    Narrative:      HISTORY:  Chest pain and shortness of breath.    COMPARISON: 10/16/2008.    FINDINGS: Monitoring leads overlie the chest. The heart is enlarged.   The aorta is unfolded.  Interstitial markings are minimally prominent.  There is no infiltrate or edema. No pneumothorax is seen. There is no  effusion.      Impression:       Cardiomegaly. No infiltrate or edema.    Geanie Cooley, MD   03/20/2015 5:35 PM            Scribe and MD Attestations     I, Clovis Riley, MD, personally performed the services documented. [facility] is scribing for me on [employer]. I reviewed and confirm the accuracy of the information in this medical record.    Huel Coventry, am serving as a Neurosurgeon to document services personally performed by Clovis Riley, MD, based on the provider's statements to me.     Credentials: Storm Frisk, scribe    Rendering  Provider: Clovis Riley, MD    Monitors, EKG, Critical Care, and Splints     EKG (interpreted by ED physician): 17:28 NSR, rate at 80 bpm, Left axis deviation, normal intervals, nonspecific T wave abnormality. Abnormal EKG.  Cardiac Monitor (interpreted by ED physician):     Critical Care:   Splint check:      MDM and Clinical Notes  Notes:  6:27 PM Some improvement with sublingual nitro x2. D/w Dr. Hyacinth Meeker (hospitalist) who will admit.    Consults:    Diagnosis and Disposition     Clinical Impression  1. Chest pain, unspecified type        Disposition  ED Disposition     Admit Bed Type: Telemetry [5]  Bed request comments: Dx: chest pain - CDU, tele, obs  Admitting Physician: Hyacinth Meeker, ERIC A [[id number]]  Patient Class: Observation [104]            Prescriptions       Current Discharge Medication List                Clovis Riley, MD  03/20/15 2256

## 2015-03-20 NOTE — H&P (Signed)
[facility]      Patient: [name]  Date: 03/20/2015   DOB: 1950-12-26  Admission Date: 03/20/2015   MRN: [id number]  Attending: Karl Pock MD       Chief Complaint   Patient presents with   . Chest Pain   . Leg Pain      History Gathered From: Self    HISTORY AND PHYSICAL     Danine Hor is a 65 y.o. female with a PMHx of CAD s/p multiple stents, PE completed 6 months AC, HTN, HLD, GERD, Gout, Anemia, Right TKR, OSA on nocturnal O2 who presented with chest pain.    States that on afternoon of admit had 8/10 substernal sharp and burning chest pain. Radiated to left arm. Associated with nausea. She then came to ER. While walking in her chest pain worsening and developed shortness of breath. She then received ASA, nitropaste and morphine with relief of her pain.   On my evaluation she noted some mild worsening of her pain which was pressure like.    Patient has also noted chronic right leg pain with mild right thigh swelling. States she had total knee replacement in past and has been having pain in knee and right thigh. This has been for many months. In ER, D dimer was negative.      ZH:[id number] smoking, alcohol and illicit use    [id number] family members with early heart disease      Past Medical History   Diagnosis Date   . Seasonal allergic rhinitis    . Anemia    . Encounter for blood transfusion    . Depression    . Gastroesophageal reflux disease    . Hyperlipidemia    . Hypertension    . Myocardial infarction    . Disorder of thyroid        Past Surgical History   Procedure Laterality Date   . Joint replacement     . Abdominal surgery     . Back surgery     . Spine surgery     . Hernia repair     . Hysterectomy         Prior to Admission medications    Medication Sig Start Date End Date Taking? Authorizing Provider   allopurinol (ZYLOPRIM) 300 MG tablet Take 300 mg by mouth nightly.      Yes [provider]   aspirin 81 MG chewable tablet Chew 81 mg by mouth daily.   Yes [provider]   Choline Fenofibrate (TRILIPIX) 135 MG capsule Take 135 mg by mouth every evening.   Yes [provider]   citalopram (CELEXA) 40 MG tablet Take 40 mg by mouth daily.   Yes [provider]   clopidogrel (PLAVIX) 75 mg tablet Take 75 mg by mouth nightly.      Yes [provider]   ferrous sulfate 325 (65 FE) MG tablet Take 325 mg by mouth 2 (two) times daily.    03/01/15  Yes [provider]   gabapentin (NEURONTIN) 100 MG capsule Take 200 mg by mouth 2 (two) times daily.    03/01/15  Yes [provider]   isosorbide mononitrate (IMDUR) 30 MG 24 hr tablet Take 30 mg by mouth daily.   Yes [provider]   levothyroxine (SYNTHROID, LEVOTHROID) 75 MCG tablet Take 75 mcg by mouth Once a day at 6:00am.   Yes [provider]   metoprolol XL (TOPROL-XL) 25 MG  24 hr tablet Take 25 mg by mouth 2 (two) times daily.   Yes [provider]   pantoprazole (PROTONIX) 40 MG tablet Take 40 mg by mouth 2 (two) times daily.      Yes [provider]   pravastatin (PRAVACHOL) 40 MG tablet Take 40 mg by mouth daily.   Yes [provider]   Choline Fenofibrate (FENOFIBRIC ACID) 135 MG Capsule Delayed Release Take 135 mg by mouth nightly.     03/20/15 Yes [provider]   citalopram (CELEXA) 20 MG tablet Take 20 mg by mouth daily.  03/20/15 Yes [provider]   levothyroxine (SYNTHROID, LEVOTHROID) 50 MCG tablet Take 50 mcg by mouth Once a day at 6:00am.  03/20/15 Yes [provider]   metoprolol (LOPRESSOR) 25 MG tablet Take 25 mg by mouth 2 (two) times daily.     03/20/15 Yes [provider]   PROAIR HFA 108 [phone] Base) MCG/ACT inhaler  02/18/15   [provider]   levothyroxine (SYNTHROID, LEVOTHROID) 25 MCG tablet Take 25 mcg by mouth Once a day at 6:00am.     03/20/15  [provider]   oxyCODONE (ROXICODONE) 5 MG immediate release tablet Take 1 tablet (5 mg total) by mouth every 4 (four)  hours as needed for Pain. 10/09/12 03/20/15  Jaci Standard, MD   quinapril (ACCUPRIL) 20 MG tablet Take 20 mg by mouth nightly.  03/20/15  [provider]   warfarin (COUMADIN) 4 MG tablet Take 4 mg by mouth daily.  03/20/15  [provider]       Allergies   Allergen Reactions   . Codeine    . Iodine    . Red Dye    . Tramadol        CODE STATUS: full code    PRIMARY CARE MD: Gray Bernhardt., MD    Family History   Problem Relation Age of Onset   . Heart attack Sister    . Heart attack Maternal Aunt    . Heart attack Paternal Aunt        Social History   Substance Use Topics   . Smoking status: Never Smoker    . Smokeless tobacco: None   . Alcohol Use: No       REVIEW OF SYSTEMS     Ten point review of systems negative or as per HPI and below endorsements.    PHYSICAL EXAM     Vital Signs (most recent): BP 138/60 mmHg  Pulse 64  Temp(Src) 97.9 F (36.6 C) (Oral)  Resp 22  Ht 1.524 m (5')  Wt 106.595 kg (235 lb)  BMI 45.90 kg/m2  SpO2 92%  Constitutional: No apparent distress. Well appearing. Patient speaks freely in full sentences.   HEENT: NC/AT, PERRL, no scleral icterus or conjunctival pallor, no nasal discharge, MMM, oropharynx without erythema or exudate  Neck: trachea midline, supple, no cervical or supraclavicular lymphadenopathy or masses  Cardiovascular: RRR, normal S1 S2, no murmurs, gallops, palpable thrills, no JVD, Non-displaced PMI.  Respiratory: Normal rate. No retractions or increased work of breathing. Clear to auscultation and percussion bilaterally.  Gastrointestinal: +BS, non-distended, soft, non-tender, no rebound or guarding, no hepatosplenomegaly  Genitourinary: no suprapubic or costovertebral angle tenderness  Musculoskeletal: ROM and motor strength grossly normal. No clubbing, edema, or cyanosis. DP and radial pulses 2+ and symmetric.  Skin: no rashes, jaundice or other lesions  Neurologic: EOMI, CN 2-12 grossly intact. no gross motor or  sensory deficits, patellar  and bicep DTR 2+ and symmetric, downward plantar reflexes  Psychiatric: AAOx3, affect and mood appropriate. The patient is alert, interactive, appropriate.    LABS & IMAGING     Recent Results (from the past 24 hour(s))   Basic Metabolic Panel    Collection Time: 03/20/15  5:50 PM   Result Value Ref Range    Glucose 93 70 - 100 mg/dL    BUN 23 (H) 7 - 19 mg/dL    Creatinine 1.2 (H) 0.6 - 1.0 mg/dL    Calcium 9.1 8.5 - 54.0 mg/dL    Sodium 981 191 - 478 mEq/L    Potassium 4.5 3.5 - 5.1 mEq/L    Chloride 110 100 - 111 mEq/L    CO2 20 (L) 22 - 29 mEq/L    Anion Gap 11.0 5.0 - 15.0   CBC and differential    Collection Time: 03/20/15  5:50 PM   Result Value Ref Range    WBC 5.03 3.50 - 10.80 x10 3/uL    Hgb 10.2 (L) 12.0 - 16.0 g/dL    Hematocrit 29.5 (L) 37.0 - 47.0 %    Platelets 240 140 - 400 x10 3/uL    RBC 4.00 (L) 4.20 - 5.40 x10 6/uL    MCV 84.0 80.0 - 100.0 fL    MCH 25.5 (L) 28.0 - 32.0 pg    MCHC 30.4 (L) 32.0 - 36.0 g/dL    RDW 20 (H) 12 - 15 %    MPV 10.8 9.4 - 12.3 fL    Neutrophils 54 None %    Lymphocytes Automated 31 None %    Monocytes 13 None %    Eosinophils Automated 2 None %    Basophils Automated 0 None %    Immature Granulocyte 0 None %    Nucleated RBC 0 0 - 1 /100 WBC    Neutrophils Absolute 2.71 1.80 - 8.10 x10 3/uL    Abs Lymph Automated 1.55 0.50 - 4.40 x10 3/uL    Abs Mono Automated 0.64 0.00 - 1.20 x10 3/uL    Abs Eos Automated 0.12 0.00 - 0.70 x10 3/uL    Absolute Baso Automated 0.01 0.00 - 0.20 x10 3/uL    Absolute Immature Granulocyte 0.02 0 x10 3/uL   Troponin I    Collection Time: 03/20/15  5:50 PM   Result Value Ref Range    Troponin I <0.01 0.00 - 0.09 ng/mL   GFR    Collection Time: 03/20/15  5:50 PM   Result Value Ref Range    EGFR 45.1    Prothrombin time/INR    Collection Time: 03/20/15  5:50 PM   Result Value Ref Range    PT 12.4 (L) 12.6 - 15.0 sec    PT INR 0.9 0.9 - 1.1    PT Anticoag. Given Within 48 hrs. None    D-Dimer    Collection Time: 03/20/15  5:50 PM   Result Value  Ref Range    D-Dimer 0.50 0.00 - 0.51 ug/mL FEU   Cell MorpHology    Collection Time: 03/20/15  5:50 PM   Result Value Ref Range    Cell Morphology: Normal     Anisocytosis =1+ (A)     Platelet Estimate Normal        MICROBIOLOGY:  Blood Culture: not done  Urine Culture: not done  Antibiotics Started: none    IMAGING (images personally reviewed and concur with radiologist unless otherwise stated below):  Chest AP Portable   Final Result    Cardiomegaly. No infiltrate or edema.      Geanie Cooley, MD    03/20/2015 5:35 PM         XR Knee 3 View Right    (Results Pending)       CARDIAC:  EKG Interpretation (on my review 3):  sinus rhythm with non-specific lateral T wave flattening.    Markers:    Recent Labs  Lab 03/20/15  1750   TROPONIN I <0.01       EMERGENCY DEPARTMENT COURSE:  Orders Placed This Encounter   Procedures   . Chest AP Portable   . XR Knee 3 View Right   . Basic Metabolic Panel   . CBC and differential   . Troponin I   . GFR   . Prothrombin time/INR   . D-Dimer   . Cell MorpHology   . Hemoglobin A1C   . CBC without differential   . Basic Metabolic Panel   . Diet NPO time specified   . Cardiac monitoring (ED ONLY)   . Vital signs   . Pulse Oximetry   . Ambulate patient   . Nasal Cannula Low-Flow (5 LPM or less)   . Intermittent Pneumatic Compression   . Telemetry 24 Hour Protocol   . Full Code   . ECG 12 lead   . ECG 12 lead (Electrocardiogram)   . ECG 12 lead   . Saline lock IV   . Saline lock IV   . Place  for Observation Services   . Einar Gip ED Bed Request       ASSESSMENT & PLAN     Trixy Loyola is a 65 y.o. female admitted under OBSERVATION with chest pain.    1. Chest Pain history of multiple stents. Per patient last LHC in 07/2014 in [place] with stent placement. Some exertional component. Relieved with nitro and morphine. On review of some past records there is note of patient having multiple presentations for chest pain in past with question of narcotic seeking behavior. EKG with  non-specific T wave flattening.   Tele   Trend [place], D-Dimer negative.   Continue ASA, plavix   Continue Metoprolol   Hold home imdur for now and continue nitropaste q6h   Continue statin, fenofibrate.   Prn nitroglycerin   Call placed to [place] [facility] to see in AM. Sees a Dr Catha Gosselin at [facility] in past.   Will keep NPO past midnight in case rules in.    2. Hypertensive Urgency   Continue home medications    3. Neuropathy   Continue neurontin    4. Acute Kidney Injury mild creatinine elevation. Unknown baseline   Continue IVF   Check BMP daily.    5. Right Leg pain chronic issue   D-Dimer negative   Check Right knee xray for knee pain.    6. Anemia mild normocytic anemia, no bleeding.  - continue iron supplement.      7. Nutrition  Cardiac, npo past midnight.    8. DVT/VTE Prophylaxis  Lovenox 40 mg subQ daily    Anticipated medical stability for discharge: February, 10 - Afternoon    Service status/Reason for ongoing hospitalization: Observation:patient is stable and improving.  Anticipated Discharge Needs: None  Face to face time:  Signed,  Karl Pock, MD  03/20/2015 8:25 PM

## 2015-03-20 NOTE — ED Notes (Signed)
30 min ago while driving began to have upper chest pain and upper back pain.  Also states her right upper leg hurts and goes up into her right groin.  Pt states kinda feels similar to previous MI in 2010.  Pt alert oriented no SOB.

## 2015-03-21 ENCOUNTER — Observation Stay: Payer: Medicare Other

## 2015-03-21 LAB — TROPONIN I
Troponin I: <0.01 ng/mL (ref 0.00–0.09)
Troponin I: <0.01 ng/mL (ref 0.00–0.09)

## 2015-03-21 LAB — BASIC METABOLIC PANEL
Anion Gap: 8 (ref 5.0–15.0)
BUN: 23 mg/dL — ABNORMAL HIGH (ref 7–19)
CO2: 21 mEq/L — ABNORMAL LOW (ref 22–29)
Calcium: 8.9 mg/dL (ref 8.5–10.5)
Chloride: 112 mEq/L — ABNORMAL HIGH (ref 100–111)
Creatinine: 1.3 mg/dL — ABNORMAL HIGH (ref 0.6–1.0)
Glucose: 99 mg/dL (ref 70–100)
Potassium: 4.7 mEq/L (ref 3.5–5.1)
Sodium: 141 mEq/L (ref 136–145)

## 2015-03-21 LAB — CBC
Hematocrit: 32.4 % — ABNORMAL LOW (ref 37.0–47.0)
Hgb: 9.7 g/dL — ABNORMAL LOW (ref 12.0–16.0)
MCH: 25.5 pg — ABNORMAL LOW (ref 28.0–32.0)
MCHC: 29.9 g/dL — ABNORMAL LOW (ref 32.0–36.0)
MCV: 85 fL (ref 80.0–100.0)
MPV: 10.8 fL (ref 9.4–12.3)
Nucleated RBC: 0 /100 WBC (ref 0–1)
Platelets: 211 10*3/uL (ref 140–400)
RBC: 3.81 10*6/uL — ABNORMAL LOW (ref 4.20–5.40)
RDW: 21 % — ABNORMAL HIGH (ref 12–15)
WBC: 4.16 10*3/uL (ref 3.50–10.80)

## 2015-03-21 LAB — ECG 12-LEAD
Atrial Rate: 80 {beats}/min
Atrial Rate: 55 {beats}/min
P Axis: 53 degrees
P Axis: 49 degrees
P-R Interval: 150 ms
P-R Interval: 162 ms
Q-T Interval: 384 ms
Q-T Interval: 446 ms
QRS Duration: 78 ms
QRS Duration: 80 ms
QTC Calculation (Bezet): 442 ms
QTC Calculation (Bezet): 426 ms
R Axis: 2 degrees
R Axis: -1 degrees
T Axis: 26 degrees
T Axis: 9 degrees
Ventricular Rate: 80 {beats}/min
Ventricular Rate: 55 {beats}/min

## 2015-03-21 LAB — GFR: EGFR: 41.2

## 2015-03-21 LAB — HEMOGLOBIN A1C
Average Estimated Glucose: 108.3 mg/dL
Hemoglobin A1C: 5.4 % (ref 4.6–5.9)

## 2015-03-21 MED ORDER — REGADENOSON 0.4 MG/5ML IV SOLN
0.4000 mg | Freq: Once | INTRAVENOUS | Status: AC | PRN
Start: 2015-03-21 — End: 2015-03-21
  Administered 2015-03-21: 0.4 mg via INTRAVENOUS
  Filled 2015-03-21: qty 5

## 2015-03-21 MED ORDER — OXYCODONE-ACETAMINOPHEN 5-325 MG PO TABS
1.0000 | ORAL_TABLET | ORAL | Status: DC | PRN
Start: 2015-03-21 — End: 2016-08-20

## 2015-03-21 MED ORDER — PRAVASTATIN SODIUM 20 MG PO TABS
40.0000 mg | ORAL_TABLET | Freq: Every evening | ORAL | Status: DC
Start: 2015-03-22 — End: 2015-03-21

## 2015-03-21 MED ORDER — TECHNETIUM TC 99M TETROFOSMIN INJECTION
1.0000 | Freq: Once | Status: AC | PRN
Start: 2015-03-21 — End: 2015-03-21
  Administered 2015-03-21: 1 via INTRAVENOUS
  Filled 2015-03-21: qty 1

## 2015-03-21 MED ORDER — ACETAMINOPHEN 325 MG PO TABS
650.0000 mg | ORAL_TABLET | Freq: Four times a day (QID) | ORAL | Status: DC | PRN
Start: 2015-03-21 — End: 2015-03-21

## 2015-03-21 MED ORDER — ONDANSETRON HCL 4 MG/2ML IJ SOLN
4.0000 mg | Freq: Four times a day (QID) | INTRAMUSCULAR | Status: DC | PRN
Start: 2015-03-21 — End: 2015-03-21

## 2015-03-21 NOTE — Progress Notes (Signed)
Cardiology Addendum:  [name] study was normal, and as such, okay for discharge from cardiac standpoint, with follow-up within 1-2 weeks with her usual cardiologist (Dr. Catha Gosselin).  I will make care team aware.

## 2015-03-21 NOTE — Discharge Summary (Signed)
[facility]      Patient: [name]  Admission Date: 03/20/2015   DOB: Oct 19, 1950  Discharge Date: 03/21/2015    MRN: [id number]  Discharge Attending: Katy Apo MD   Referring Physician: Gray Bernhardt., MD  PCP: Bluford Main Micheal Likens., MD       DISCHARGE SUMMARY     Discharge Information   Admission Diagnosis:   Atypical chest pain     Discharge Diagnosis:   Patient Active Problem List    Diagnosis Date Noted   . Chest pain possibly costochondritis  10/07/2012   . Coronary artery disease 10/07/2012   . HTN (hypertension) 10/07/2012   . HLD (hyperlipidemia) 10/07/2012   . Gout 10/07/2012   . Nephrolithiasis 10/07/2012   . History of pulmonary embolism 10/07/2012   . Hypothyroid 10/07/2012   . Obesity 10/07/2012   . Depression 10/07/2012   . GERD (gastroesophageal reflux disease) 10/07/2012   . Anemia 10/07/2012   . OSA (obstructive sleep apnea) 10/07/2012        Admission Condition: fair  Discharge Condition: stable  Functional Status: Patient is independent with mobility/ambulation, transfers, ADL's, IADL's.    Discharge Medications:     Medication List      START taking these medications          oxyCODONE-acetaminophen 5-325 MG per tablet   Commonly known as:  PERCOCET   Take 1 tablet by mouth every 4 (four) hours as needed for Pain.         CONTINUE taking these medications          allopurinol 300 MG tablet   Commonly known as:  ZYLOPRIM       aspirin 81 MG chewable tablet       citalopram 40 MG tablet   Commonly known as:  CeleXA       clopidogrel 75 mg tablet   Commonly known as:  PLAVIX       ferrous sulfate 325 (65 FE) MG tablet       gabapentin 100 MG capsule   Commonly known as:  NEURONTIN       isosorbide mononitrate 30 MG 24 hr tablet   Commonly known as:  IMDUR       levothyroxine 75 MCG tablet   Commonly known as:  SYNTHROID, LEVOTHROID       metoprolol XL 25 MG 24 hr tablet   Commonly known as:  TOPROL-XL       pantoprazole 40 MG tablet   Commonly known as:  PROTONIX        pravastatin 40 MG tablet   Commonly known as:  PRAVACHOL       PROAIR HFA 108 (90 Base) MCG/ACT inhaler   Generic drug:  albuterol       TRILIPIX 135 MG capsule   Generic drug:  Choline Fenofibrate            Where to Get Your Medications      You can get these medications from any pharmacy     Bring a paper prescription for each of these medications    - oxyCODONE-acetaminophen 5-325 MG per tablet              Hospital Course   Presentation History   [name] is a 65 y.o. female with a PMHx of CAD s/p multiple stents, PE completed 6 months AC, HTN, HLD, GERD, Gout, Anemia, Right TKR, OSA on nocturnal O2 who presented with chest pain.  States that on afternoon of admit had 8/10 substernal sharp and burning chest pain. Radiated to left arm. Associated with nausea. She then came to ER. While walking in her chest pain worsening and developed shortness of breath. She then received ASA, nitropaste and morphine with relief of her pain.   On my evaluation she noted some mild worsening of her pain which was pressure like.    Patient has also noted chronic right leg pain with mild right thigh swelling. States she had total knee replacement in past and has been having pain in knee and right thigh. This has been for many months. In ER, D dimer was negative.    See HPI for details.    Hospital Course ( Days)     1.Atypical chest pain,secondary to Costochondritis  ACS ruled with negative Troponin and normal Stress test  Percocet PRN prescribed.Patient stated that she has tolerated Percocet and Dilaudid before although her list of allergies include Codeine and Iodine  Patient was encouraged to minimize narcotic use  Cardiology follow up as outpatient    2. History of CAD s/p PCI and stent placement  Continue DAPT,Nitro,Beta blocker,Statin    3. Right knee pain,h/o Right TKA  Right knee X-ray was done which showed expected post-operative changes  Patient is able to ambulate independently without any difficulty    4.  Neuropathy  Continue Neurontin    5. Hypertension  Better controlled at discharge.Continue current regimen    6. ARF,pre-renal due to dehydration  Encouraged oral hydration  Advised to follow up with PCP as outpatient and have chemistry panel repeated as outpatient.    7. Morbid obesity  Advised weight loss/Life-style modification    Procedures/Imaging:   NM Myocardial Perfusion Spect (Stress And Rest)   Final Result   Impression :   1.  No evidence of ischemia or infarction. Inferior wall attenuation   2. Preserved left ventricle function ejection fraction 65%. Discussed w/   Dr. Cathleen Corti 1:00 PM            Marty Heck, MD    03/21/2015 1:04 PM         CV Cardiac Stress Test Tracing Only   Final Result    Normal Lexiscan protocol. Nuclear images pending.      Particia Jasper, MD    03/21/2015 11:49 AM         XR Knee 1 Or 2 Views Right   Final Result    Expected postoperative changes following right knee   replacement.      Wilmon Pali, MD    03/21/2015 9:17 AM         Chest AP Portable   Final Result    Cardiomegaly. No infiltrate or edema.      Geanie Cooley, MD    03/20/2015 5:35 PM             Treatment Team:   Attending Provider: Katy Apo, MD  Consulting Physician: Scarlett Presto, MD       Best Practices   Was the patient admitted with either a CHF Exacerbation or Pneumonia? No     Progress Note/Physical Exam at Discharge     Subjective: Awake and alert,oriented x 3,pain is controlled    Filed Vitals:    03/20/15 2031 03/20/15 2355 03/21/15 0411 03/21/15 0736   BP: 144/66 121/56 113/53 109/52   Pulse: 56 65 60 56   Temp: 97.7 F (36.5 C) 97 F (36.1 C) 97  F (36.1 C) 97.8 F (36.6 C)   TempSrc: Axillary Axillary Oral Oral   Resp: 20 20 20 14    Height:       Weight:       SpO2: 93% 90% 96% 97%       General: NAD, AAOx3  HEENT: perrla, eomi, sclera anicteric, OP: Clear, MMM  Neck: supple, FROM, no LAD  Cardiovascular: RRR, no m/r/g  Lungs: CTAB, no w/r/r  Abdomen: soft,obese, +BS, NT/ND, no masses,  no g/r  Extremities: no C/C/E,ROM of b/l Knees intact,no joint deformities noted  Skin: no rashes or lesions noted  Neuro: CN 2-12 intact; No Focal neurological deficits       Diagnostics     Labs/Studies Pending at Discharge: No    Last Labs     Recent Labs  Lab 03/21/15  0559 03/20/15  1750   WBC 4.16 5.03   RBC 3.81* 4.00*   HGB 9.7* 10.2*   HEMATOCRIT 32.4* 33.6*   MCV 85.0 84.0   PLATELETS 211 240         Recent Labs  Lab 03/21/15  0559 03/20/15  1750   SODIUM 141 141   POTASSIUM 4.7 4.5   CHLORIDE 112* 110   CO2 21* 20*   BUN 23* 23*   CREATININE 1.3* 1.2*   GLUCOSE 99 93   CALCIUM 8.9 9.1             Microbiology Results     None           Patient Instructions   Discharge Diet: cardiac diet  Discharge Activity:  activity as tolerated    Follow Up Appointment:  Follow-up Information     Follow up with Primary cardiologist in 3-4 weeks In 2 weeks.    Why:  If symptoms worsen           Time spent examining patient, discussing with patient/family regarding hospital course, chart review, reconciling medications and discharge planning: 40 minutes.    Signed,  Katy Apo, MD  1:18 PM 03/21/2015

## 2015-03-21 NOTE — Plan of Care (Signed)
Problem: Hemodynamic Status: Cardiac  Goal: Stable vital signs and fluid balance  Intervention: Monitor/assess vital signs, hemodynamic parameters, ECG and temperature every 1 hours until telemetry/step-down status, then every 4 hours.      Pt admitted to CDU from ER in stable condition.  Oriented to unit, room, call bell, bed controls.  Pt is a/o x 3.  SR on them monitor.  HR low 80's..  Pt informed of plan of care including hourly rounding.  Pt verbalizes agreement of plan.  CE x 2 negative.  Pt has no complaints at present time.  Will continue to monitor and assess to meet patient's needs.

## 2015-03-21 NOTE — Discharge Instr - Diet (Signed)
Low fat low chol low salt diet

## 2015-03-21 NOTE — Progress Notes (Signed)
Pt discharged to home 3 hours after IV morphine given.  Pt has script for percocet and is aware of side effects and next time to take each med.  Pt to walk off unit.  IV removed intact.  Pt aware of f/u appt.

## 2015-03-21 NOTE — Discharge Instructions (Signed)
Noncardiac Chest Pain    Based on your visit today, the health care provider doesn't know what is causing your chest pain. In most cases, people who come to the emergency department with chest pain don't have a problem with their heart. Instead, the pain is caused by other conditions. These may be problems with the lungs, muscles, bones, digestive tract, nerves, or mental health.  Lung problems   Inflammation around the lungs (pleurisy)   Collapsed lung (pneumothorax)   Fluid around the lungs (pleural effusion)   Lung cancer. This is a rare cause of chest pain.  Muscle or bone problems   Inflamed cartilage between the ribs (pleurisy)   Fibromyalgia   Rheumatoid arthritis  Digestive system problems   Reflux   Stomach ulcer   Spasms of the esophagus   Gall stones   Gallbladder inflammation  Mental health conditions   Panic or anxiety attacks   Emotional distress  Your condition doesn't seem serious and your pain doesn't appear to be coming from your heart. But sometimes the signs of a serious problem take more time to appear. Watch for the warning signs listed below.  Home care  Follow these guidelines when caring for yourself at home:   Rest today and avoid strenuous activity.   Take any prescribed medicine as directed.  Follow-up care  Follow up with your health care provider, or as advised, if you don't start to feel better within 24 hours.  When to seek medical advice  Call your health care provider right away if any of these occur:   A change in the type of pain. Call if it feels different, becomes more serious, lasts longer, or begins to spread into your shoulder, arm, neck, jaw, or back.   Shortness of breath   You feel more pain when you breathe   Cough with dark-colored mucus or blood   Weakness, dizziness, or fainting   Fever of 100.4F (38C) or higher, or as directed by your health care provider   Swelling, pain, or redness in one leg     2000-2015 [employer] [address], [place] [address]. All rights reserved. This information is not intended as a substitute for professional medical care. Always follow your healthcare professional's instructions.          Chest Pain, Uncertain Cause  Chest pain can happen for a number of reasons. Sometimes the cause can not be determined. If yourcondition does not seem serious, and your pain does not appear to be coming from your heart, your doctor may recommend watching it closely. Sometimes the signs of a serious problem take more time to appear. Therefore, watch for the warning signs listed below.  Home care  After your visit, follow these recommendations:   Rest today and avoid strenuous activity.   Take any prescribed medicine as directed.  Follow-up care  Follow up with your doctor or this facility as instructed or if you do not start to feel better within 24 hours.  Call 911  Get immediate medical attention if any of the following occur:   A change in the type of pain: if it feels different, becomes more severe, lasts longer, or begins to spread into your shoulder, arm, neck, jaw or back   Shortness of breath or increased pain with breathing   Weakness, dizziness, or fainting   Rapid heart beat  Get prompt medical attention  Call your doctor right away if any of the following occur:     Cough with dark colored sputum (phlegm) or blood   Fever of 100.79F(38C) or higher, or as directed by your health care provider   Swelling, pain or redness in one leg   2000-2015 [employer] [address], [place], [place] [address]. All rights reserved. This information is not intended as a substitute for professional medical care. Always follow your healthcare professional's instructions.      Oxycodone Hydrochloride, Acetaminophen Oral tablet  What is this medicine?  ACETAMINOPHEN; OXYCODONE (a set a MEE noe fen; ox i KOE done) is a pain reliever. It is used to treat mild to moderate pain.  This medicine may be  used for other purposes; ask your health care provider or pharmacist if you have questions.  What should I tell my health care provider before I take this medicine?  They need to know if you have any of these conditions:   brain tumor   Crohn's disease, inflammatory bowel disease, or ulcerative colitis   drug abuse or addiction   head injury   heart or circulation problems   if you often drink alcohol   kidney disease or problems going to the bathroom   liver disease   lung disease, asthma, or breathing problems   an unusual or allergic reaction to acetaminophen, oxycodone, other opioid analgesics, other medicines, foods, dyes, or preservatives   pregnant or trying to get pregnant   breast-feeding  How should I use this medicine?  Take this medicine by mouth with a full glass of water. Follow the directions on the prescription label. Take your medicine at regular intervals. Do not take your medicine more often than directed.  Talk to your pediatrician regarding the use of this medicine in children. Special care may be needed.  Patients over 22 years old may have a stronger reaction and need a smaller dose.  Overdosage: If you think you have taken too much of this medicine contact a poison control center or emergency room at once.  NOTE: This medicine is only for you. Do not share this medicine with others.  What if I miss a dose?  If you miss a dose, take it as soon as you can. If it is almost time for your next dose, take only that dose. Do not take double or extra doses.  What may interact with this medicine?   alcohol   antihistamines   barbiturates like amobarbital, butalbital, butabarbital, methohexital, pentobarbital, phenobarbital, thiopental, and secobarbital   benztropine   drugs for bladder problems like solifenacin, trospium, oxybutynin, tolterodine, hyoscyamine, and methscopolamine   drugs for breathing problems like ipratropium and tiotropium   drugs for certain stomach or intestine  problems like propantheline, homatropine methylbromide, glycopyrrolate, atropine, belladonna, and dicyclomine   general anesthetics like etomidate, ketamine, nitrous oxide, propofol, desflurane, enflurane, halothane, isoflurane, and sevoflurane   medicines for depression, anxiety, or psychotic disturbances   medicines for sleep   muscle relaxants   naltrexone   narcotic medicines (opiates) for pain   phenothiazines like perphenazine, thioridazine, chlorpromazine, mesoridazine, fluphenazine, prochlorperazine, promazine, and trifluoperazine   scopolamine   tramadol   trihexyphenidyl  This list may not describe all possible interactions. Give your health care provider a list of all the medicines, herbs, non-prescription drugs, or dietary supplements you use. Also tell them if you smoke, drink alcohol, or use illegal drugs. Some items may interact with your medicine.  What should I watch for while using this medicine?  Tell your doctor or health care professional  if your pain does not go away, if it gets worse, or if you have new or a different type of pain. You may develop tolerance to the medicine. Tolerance means that you will need a higher dose of the medication for pain relief. Tolerance is normal and is expected if you take this medicine for a long time.  Do not suddenly stop taking your medicine because you may develop a severe reaction. Your body becomes used to the medicine. This does NOT mean you are addicted. Addiction is a behavior related to getting and using a drug for a non-medical reason. If you have pain, you have a medical reason to take pain medicine. Your doctor will tell you how much medicine to take. If your doctor wants you to stop the medicine, the dose will be slowly lowered over time to avoid any side effects.  You may get drowsy or dizzy. Do not drive, use machinery, or do anything that needs mental alertness until you know how this medicine affects you. Do not stand or sit up  quickly, especially if you are an older patient. This reduces the risk of dizzy or fainting spells. Alcohol may interfere with the effect of this medicine. Avoid alcoholic drinks.  There are different types of narcotic medicines (opiates) for pain. If you take more than one type at the same time, you may have more side effects. Give your health care provider a list of all medicines you use. Your doctor will tell you how much medicine to take. Do not take more medicine than directed. Call emergency for help if you have problems breathing.  The medicine will cause constipation. Try to have a bowel movement at least every 2 to 3 days. If you do not have a bowel movement for 3 days, call your doctor or health care professional.  Do not take Tylenol (acetaminophen) or medicines that have acetaminophen with this medicine. Too much acetaminophen can be very dangerous. Many nonprescription medicines contain acetaminophen. Always read the labels carefully to avoid taking more acetaminophen.  What side effects may I notice from receiving this medicine?  Side effects that you should report to your doctor or health care professional as soon as possible:   allergic reactions like skin rash, itching or hives, swelling of the face, lips, or tongue   breathing difficulties, wheezing   confusion   light headedness or fainting spells   severe stomach pain   unusually weak or tired   yellowing of the skin or the whites of the eyes  Side effects that usually do not require medical attention (report to your doctor or health care professional if they continue or are bothersome):   dizziness   drowsiness   nausea   vomiting  This list may not describe all possible side effects. Call your doctor for medical advice about side effects. You may report side effects to FDA at 1-800-FDA-1088.  Where should I keep my medicine?  Keep out of the reach of children. This medicine can be abused. Keep your medicine in a safe place to protect  it from theft. Do not share this medicine with anyone. Selling or giving away this medicine is dangerous and against the law.  Store at room temperature between 20 and 25 degrees C (68 and 77 degrees F). Keep container tightly closed. Protect from light.  This medicine may cause accidental overdose and death if it is taken by other adults, children, or pets. Flush any unused medicine down the toilet to  reduce the chance of harm. Do not use the medicine after the expiration date.  NOTE: This sheet is a summary. It may not cover all possible information. If you have questions about this medicine, talk to your doctor, pharmacist, or health care provider.  NOTE:This sheet is a summary. It may not cover all possible information. If you have questions about this medicine, talk to your doctor, pharmacist, or health care provider. Copyright 2015 [employer]

## 2015-03-21 NOTE — Discharge Instr - Activity (Signed)
Activity as tolerated, careful not to fall

## 2015-03-21 NOTE — Consults (Signed)
[facility] CONSULTATION REPORT  [facility]    Date Time: 03/21/2015 9:03 AMPatient Name: [name]  Requesting Physician: Katy Apo, MD       Reason for Consultation:   Chest pain and history of CAD      History:   Choua Ikner is a 65 y.o. female admitted on 03/20/2015.  We have been asked by Katy Apo, MD,  to provide cardiac consultation, regarding chest pain and a history of CAD.  She has a complex history of CAD as follows:    1.  IMI 2010 with multiple stents to RCA  2.  Staged intervention to LAD and diagonal shortly thereafter  3.  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  Multiple presentations with chest pain thereafter - numerous MPI studies and caths which were unrevealing  5.  LAD stent 2016 in [place]  6.  Usual cardiologist is Dr. Catha Gosselin in [place], on drive home from [place] to [facility], she developed chest pain, radiating to back and left arm with nausea and some sob.  8/10.  Decreased some with NTG and more with Morphine although still not yet full resolved.    CAD risks:  Fhx, htn, hld, obese, osa, post-menopause      Past Medical History:     Past Medical History   Diagnosis Date   . Seasonal allergic rhinitis    . Anemia    . Encounter for blood transfusion    . Depression    . Gastroesophageal reflux disease    . Hyperlipidemia    . Hypertension    . Myocardial infarction    . Disorder of thyroid        Past Surgical History:     Past Surgical History   Procedure Laterality Date   . Joint replacement     . Abdominal surgery     . Back surgery     . Spine surgery     . Hernia repair     . Hysterectomy         Family History:     Family History   Problem Relation Age of Onset   . Heart attack Sister    . Heart attack Maternal Aunt    . Heart attack Paternal Aunt        Social History:     Social History     Social History   . Marital Status: Divorced     Spouse Name: N/A   . Number of Children: N/A   . Years of  Education: N/A     Social History Main Topics   . Smoking status: Never Smoker    . Smokeless tobacco: Not on file   . Alcohol Use: No   . Drug Use: No   . Sexual Activity: Not on file     Other Topics Concern   . Not on file     Social History Narrative       Allergies:     Allergies   Allergen Reactions   . Codeine    . Iodine    . Red Dye    . Tramadol        Medications:     Prescriptions prior to admission   Medication Sig   . allopurinol (ZYLOPRIM) 300 MG tablet Take 300 mg by mouth nightly.      Marland Kitchen aspirin 81 MG chewable tablet Chew 81 mg by  mouth daily.   . Choline Fenofibrate (TRILIPIX) 135 MG capsule Take 135 mg by mouth every evening.   . citalopram (CELEXA) 40 MG tablet Take 40 mg by mouth daily.   . clopidogrel (PLAVIX) 75 mg tablet Take 75 mg by mouth nightly.      . ferrous sulfate 325 (65 FE) MG tablet Take 325 mg by mouth 2 (two) times daily.      Marland Kitchen gabapentin (NEURONTIN) 100 MG capsule Take 200 mg by mouth 2 (two) times daily.      . isosorbide mononitrate (IMDUR) 30 MG 24 hr tablet Take 30 mg by mouth daily.   Marland Kitchen levothyroxine (SYNTHROID, LEVOTHROID) 75 MCG tablet Take 75 mcg by mouth Once a day at 6:00am.   . metoprolol XL (TOPROL-XL) 25 MG 24 hr tablet Take 25 mg by mouth 2 (two) times daily.   . pantoprazole (PROTONIX) 40 MG tablet Take 40 mg by mouth 2 (two) times daily.      . pravastatin (PRAVACHOL) 40 MG tablet Take 40 mg by mouth daily.   Marland Kitchen PROAIR HFA 108 (90 Base) MCG/ACT inhaler        Current Facility-Administered Medications   Medication Dose Route Frequency Provider Last Rate Last Dose   . 0.9%  NaCl infusion   Intravenous Continuous Scarlett Presto, MD 100 mL/hr at 03/21/15 0343     . acetaminophen (TYLENOL) tablet 500 mg  500 mg Oral Q4H PRN Scarlett Presto, MD   500 mg at 03/20/15 2127   . allopurinol (ZYLOPRIM) tablet 300 mg  300 mg Oral QHS Scarlett Presto, MD   300 mg at 03/20/15 2128   . aspirin EC tablet 81 mg  81 mg Oral Daily Scarlett Presto, MD       . citalopram (CeleXA) tablet  40 mg  40 mg Oral Daily Scarlett Presto, MD       . clopidogrel (PLAVIX) tablet 75 mg  75 mg Oral QHS Scarlett Presto, MD   75 mg at 03/20/15 2128   . enoxaparin (LOVENOX) syringe 40 mg  40 mg Subcutaneous Daily Scarlett Presto, MD       . famotidine (PEPCID) tablet 20 mg  20 mg Oral Q12H [facility] Scarlett Presto, MD   20 mg at 03/20/15 2129   . fenofibrate micronized (LOFIBRA) capsule 134 mg  134 mg Oral QAM W/BREAKFAST Scarlett Presto, MD       . ferrous sulfate EC tablet 324 mg  324 mg Oral BID Scarlett Presto, MD   324 mg at 03/20/15 2129   . gabapentin (NEURONTIN) capsule 200 mg  200 mg Oral BID Scarlett Presto, MD   200 mg at 03/20/15 2130   . metoprolol XL (TOPROL-XL) 24 hr tablet 25 mg  25 mg Oral BID Scarlett Presto, MD   25 mg at 03/20/15 2130   . morphine injection 4 mg  4 mg Intravenous Q4H PRN Scarlett Presto, MD   4 mg at 03/21/15 0340   . naloxone [facility]) injection 0.2 mg  0.2 mg Intravenous PRN Scarlett Presto, MD       . nitroglycerin (NITRO-BID) 2 % ointment 0.5 inch  0.5 inch Topical 4 times per day Scarlett Presto, MD   0.5 inch at 03/21/15 0412   . nitroglycerin (NITROSTAT) SL tablet 0.4 mg  0.4 mg Sublingual Q5 Min PRN Scarlett Presto, MD       . ondansetron [facility]) injection 4  mg  4 mg Intravenous Q8H PRN Scarlett Presto, MD       . pantoprazole (PROTONIX) EC tablet 40 mg  40 mg Oral Daily Scarlett Presto, MD   40 mg at 03/20/15 2131   . pravastatin (PRAVACHOL) tablet 40 mg  40 mg Oral Daily Scarlett Presto, MD       . sodium chloride (PF) 0.9 % flush 3 mL  3 mL Intracatheter Q8H Scarlett Presto, MD   3 mL at 03/20/15 2131         Review of Systems:    Comprehensive review of systems including constitutional, eyes, ears, nose, mouth, throat, cardiovascular, GI, GU, musculoskeletal, integumentary, respiratory, neurologic, psychiatric, and endocrine is negative other than what is mentioned already in the history of present illness    Physical Exam:     Filed Vitals:    03/21/15 0736   BP: 109/52   Pulse: 56      Temp: 97.8 F (36.6 C)   Resp: 14   SpO2: 97%     Temp (24hrs), Avg:97.5 F (36.4 C), Min:97 F (36.1 C), Max:97.9 F (36.6 C)      Intake and Output Summary (Last 24 hours) at Date Time    Intake/Output Summary (Last 24 hours) at 03/21/15 0903  Last data filed at 03/21/15 0009   Gross per 24 hour   Intake      0 ml   Output      0 ml   Net      0 ml       GENERAL: Patient is c/o occasional chest pain, and back pain and left leg pain and some HA.  HEENT: No scleral icterus or conjunctival pallor, moist mucous membranes   NECK: No jugular venous distention or thyromegaly, normal carotid upstrokes without bruits   CARDIAC: Normal apical impulse, regular rate and rhythm, with normal S1 and S2, and no murmurs, rubs, or gallops   CHEST: Clear to auscultation bilaterally, normal respiratory effort  ABDOMEN: No abdominal bruits, masses, or hepatosplenomegaly, nontender, non-distended, good bowel sounds . Obese.  EXTREMITIES: trace bilateral pretib and ankle edema  SKIN: No rash or jaundice   NEUROLOGIC: Alert and oriented to time, place and person, normal mood and affect  MUSCULOSKELETAL: Normal muscle strength and tone.      Labs Reviewed:       Recent Labs  Lab 03/21/15  0559 03/20/15  2359 03/20/15  1750   TROPONIN I <0.01 <0.01 <0.01                       Recent Labs  Lab 03/20/15  1750   PT 12.4*   PT INR 0.9       Recent Labs  Lab 03/21/15  0559 03/20/15  1750   WBC 4.16 5.03   HGB 9.7* 10.2*   HEMATOCRIT 32.4* 33.6*   PLATELETS 211 240       Recent Labs  Lab 03/21/15  0559 03/20/15  1750   SODIUM 141 141   POTASSIUM 4.7 4.5   CHLORIDE 112* 110   CO2 21* 20*   BUN 23* 23*   CREATININE 1.3* 1.2*   EGFR 41.2 45.1   GLUCOSE 99 93   CALCIUM 8.9 9.1     Ekg: nsr. Nonspecific anterior twave changes unchanged from 2014    Radiology   Radiological Procedure reviewed.      chest X-ray  Assessment:    Chest pain  with several features typical of angina, but also some atypical features to her history, and negative enzymes  and ekgs despite prolonged discomfort   Long hx of CAD with multiple PCIs elsewhere as outlined above   Also numerous presentations with chest pain through the years with negative MPIs and/or repeat caths   Hx PE 2014 as well   Knee pain   CAD risks: Obesity and OSA, HTN, HLD, family hx, post-menopause   Usual cardiology care in [place]    Recommendations:    Lexiscan MPI today - if meaningful ischemia, would merit cardiac cath.  If normal, augment antianginal tx empirically and prompt followup with usual cardiologist.   Continue DAPT, beta blocker, nitrate, statin for now            Signed by: Wynona Canes, MD    [facility]  NP Spectralink [phone] (8am-5pm)  MD Spectralink [phone] (8am-5pm)  After hours, non urgent consult line [phone]  After Hours, urgent consults [phone]

## 2015-07-17 DIAGNOSIS — G43009 Migraine without aura, not intractable, without status migrainosus: Secondary | ICD-10-CM

## 2015-07-17 HISTORY — DX: Migraine without aura, not intractable, without status migrainosus: G43.009

## 2015-08-19 DIAGNOSIS — I200 Unstable angina: Secondary | ICD-10-CM

## 2015-08-19 HISTORY — DX: Unstable angina: I20.0

## 2016-08-20 ENCOUNTER — Emergency Department: Payer: Medicare Other

## 2016-08-20 ENCOUNTER — Observation Stay: Payer: Medicare Other

## 2016-08-20 ENCOUNTER — Observation Stay
Admission: EM | Admit: 2016-08-20 | Discharge: 2016-08-22 | Disposition: A | Discharge status: Home / Self Care | Payer: Medicare Other | Attending: Internal Medicine | Admitting: Internal Medicine

## 2016-08-20 DIAGNOSIS — Z7902 Long term (current) use of antithrombotics/antiplatelets: Secondary | ICD-10-CM | POA: Insufficient documentation

## 2016-08-20 DIAGNOSIS — Z7982 Long term (current) use of aspirin: Secondary | ICD-10-CM | POA: Insufficient documentation

## 2016-08-20 DIAGNOSIS — R9431 Abnormal electrocardiogram [ECG] [EKG]: Secondary | ICD-10-CM | POA: Insufficient documentation

## 2016-08-20 DIAGNOSIS — E785 Hyperlipidemia, unspecified: Secondary | ICD-10-CM | POA: Insufficient documentation

## 2016-08-20 DIAGNOSIS — R0602 Shortness of breath: Secondary | ICD-10-CM | POA: Insufficient documentation

## 2016-08-20 DIAGNOSIS — Z955 Presence of coronary angioplasty implant and graft: Secondary | ICD-10-CM | POA: Insufficient documentation

## 2016-08-20 DIAGNOSIS — I10 Essential (primary) hypertension: Secondary | ICD-10-CM

## 2016-08-20 DIAGNOSIS — J302 Other seasonal allergic rhinitis: Secondary | ICD-10-CM | POA: Insufficient documentation

## 2016-08-20 DIAGNOSIS — K219 Gastro-esophageal reflux disease without esophagitis: Secondary | ICD-10-CM | POA: Insufficient documentation

## 2016-08-20 DIAGNOSIS — M7989 Other specified soft tissue disorders: Secondary | ICD-10-CM | POA: Insufficient documentation

## 2016-08-20 DIAGNOSIS — I252 Old myocardial infarction: Secondary | ICD-10-CM | POA: Insufficient documentation

## 2016-08-20 DIAGNOSIS — G4733 Obstructive sleep apnea (adult) (pediatric): Secondary | ICD-10-CM

## 2016-08-20 DIAGNOSIS — R110 Nausea: Secondary | ICD-10-CM | POA: Insufficient documentation

## 2016-08-20 DIAGNOSIS — M79605 Pain in left leg: Secondary | ICD-10-CM | POA: Insufficient documentation

## 2016-08-20 DIAGNOSIS — E669 Obesity, unspecified: Secondary | ICD-10-CM | POA: Insufficient documentation

## 2016-08-20 DIAGNOSIS — R079 Chest pain, unspecified: Principal | ICD-10-CM | POA: Insufficient documentation

## 2016-08-20 DIAGNOSIS — Z6841 Body Mass Index (BMI) 40.0 and over, adult: Secondary | ICD-10-CM | POA: Insufficient documentation

## 2016-08-20 DIAGNOSIS — D649 Anemia, unspecified: Secondary | ICD-10-CM

## 2016-08-20 DIAGNOSIS — N200 Calculus of kidney: Secondary | ICD-10-CM

## 2016-08-20 DIAGNOSIS — Z86711 Personal history of pulmonary embolism: Secondary | ICD-10-CM | POA: Insufficient documentation

## 2016-08-20 DIAGNOSIS — E039 Hypothyroidism, unspecified: Secondary | ICD-10-CM | POA: Insufficient documentation

## 2016-08-20 LAB — PT AND APTT
PT INR: 1 (ref 0.9–1.1)
PT: 13 s (ref 12.6–15.0)
PTT: 30 s (ref 23–37)

## 2016-08-20 LAB — HEPATIC FUNCTION PANEL
ALT: 32 U/L (ref 0–55)
AST (SGOT): 23 U/L (ref 5–34)
Albumin/Globulin Ratio: 1.3 (ref 0.9–2.2)
Albumin: 4.3 g/dL (ref 3.5–5.0)
Alkaline Phosphatase: 75 U/L (ref 37–106)
Bilirubin Direct: 0.3 mg/dL (ref 0.0–0.5)
Bilirubin Indirect: 0.6 mg/dL (ref 0.0–1.1)
Bilirubin, Total: 0.9 mg/dL (ref 0.2–1.2)
Globulin: 3.3 g/dL (ref 2.0–3.6)
Protein, Total: 7.6 g/dL (ref 6.0–8.3)

## 2016-08-20 LAB — IHS D-DIMER: D-Dimer: 0.36 ug/mL FEU (ref 0.00–0.70)

## 2016-08-20 LAB — CBC AND DIFFERENTIAL
Absolute NRBC: 0 10*3/uL
Basophils Absolute Automated: 0.03 10*3/uL (ref 0.00–0.20)
Basophils Automated: 0.4 %
Eosinophils Absolute Automated: 0.08 10*3/uL (ref 0.00–0.70)
Eosinophils Automated: 1 %
Hematocrit: 40.4 % (ref 37.0–47.0)
Hgb: 13.8 g/dL (ref 12.0–16.0)
Immature Granulocytes Absolute: 0.02 10*3/uL
Immature Granulocytes: 0.2 %
Lymphocytes Absolute Automated: 1.77 10*3/uL (ref 0.50–4.40)
Lymphocytes Automated: 21.7 %
MCH: 30.7 pg (ref 28.0–32.0)
MCHC: 34.2 g/dL (ref 32.0–36.0)
MCV: 89.8 fL (ref 80.0–100.0)
MPV: 10.8 fL (ref 9.4–12.3)
Monocytes Absolute Automated: 0.8 10*3/uL (ref 0.00–1.20)
Monocytes: 9.8 %
Neutrophils Absolute: 5.44 10*3/uL (ref 1.80–8.10)
Neutrophils: 66.9 %
Nucleated RBC: 0 /100 WBC (ref 0.0–1.0)
Platelets: 224 10*3/uL (ref 140–400)
RBC: 4.5 10*6/uL (ref 4.20–5.40)
RDW: 13 % (ref 12–15)
WBC: 8.14 10*3/uL (ref 3.50–10.80)

## 2016-08-20 LAB — BASIC METABOLIC PANEL
Anion Gap: 15 (ref 5.0–15.0)
BUN: 22 mg/dL — ABNORMAL HIGH (ref 7–19)
CO2: 25 mEq/L (ref 22–29)
Calcium: 9.9 mg/dL (ref 8.5–10.5)
Chloride: 104 mEq/L (ref 100–111)
Creatinine: 1.1 mg/dL — ABNORMAL HIGH (ref 0.6–1.0)
Glucose: 119 mg/dL — ABNORMAL HIGH (ref 70–100)
Potassium: 3.5 mEq/L (ref 3.5–5.1)
Sodium: 144 mEq/L (ref 136–145)

## 2016-08-20 LAB — B-TYPE NATRIURETIC PEPTIDE: B-Natriuretic Peptide: 69 pg/mL (ref 0–100)

## 2016-08-20 LAB — GFR: EGFR: 49.7

## 2016-08-20 LAB — TROPONIN I: Troponin I: <0.01 ng/mL (ref 0.00–0.09)

## 2016-08-20 MED ORDER — DIPHENHYDRAMINE HCL 50 MG/ML IJ SOLN
25.0000 mg | Freq: Once | INTRAMUSCULAR | Status: AC
Start: 2016-08-20 — End: 2016-08-20
  Administered 2016-08-20: 25 mg via INTRAVENOUS
  Filled 2016-08-20: qty 1

## 2016-08-20 MED ORDER — ASPIRIN 81 MG PO TBEC
81.0000 mg | DELAYED_RELEASE_TABLET | Freq: Every day | ORAL | Status: DC
Start: 2016-08-21 — End: 2016-08-22
  Administered 2016-08-21 – 2016-08-22 (×2): 81 mg via ORAL
  Filled 2016-08-20 (×2): qty 1

## 2016-08-20 MED ORDER — AMLODIPINE BESYLATE 5 MG PO TABS
10.0000 mg | ORAL_TABLET | Freq: Every day | ORAL | Status: DC
Start: 2016-08-20 — End: 2016-08-22
  Administered 2016-08-20 – 2016-08-21 (×2): 10 mg via ORAL
  Filled 2016-08-20 (×2): qty 2

## 2016-08-20 MED ORDER — ONDANSETRON HCL 4 MG/2ML IJ SOLN
4.0000 mg | Freq: Four times a day (QID) | INTRAMUSCULAR | Status: DC | PRN
Start: 2016-08-20 — End: 2016-08-21

## 2016-08-20 MED ORDER — ONDANSETRON HCL 4 MG/2ML IJ SOLN
8.0000 mg | Freq: Once | INTRAMUSCULAR | Status: AC
Start: 2016-08-20 — End: 2016-08-20
  Administered 2016-08-20: 8 mg via INTRAVENOUS
  Filled 2016-08-20: qty 4

## 2016-08-20 MED ORDER — ATORVASTATIN CALCIUM 20 MG PO TABS
80.0000 mg | ORAL_TABLET | Freq: Every day | ORAL | Status: DC
Start: 2016-08-20 — End: 2016-08-22
  Administered 2016-08-20 – 2016-08-21 (×2): 80 mg via ORAL
  Filled 2016-08-20 (×2): qty 4

## 2016-08-20 MED ORDER — MORPHINE SULFATE 4 MG/ML IJ/IV SOLN (WRAP)
4.0000 mg | Freq: Once | Status: AC
Start: 2016-08-20 — End: 2016-08-20
  Administered 2016-08-20: 4 mg via INTRAVENOUS
  Filled 2016-08-20: qty 1

## 2016-08-20 MED ORDER — ONDANSETRON 4 MG PO TBDP
4.0000 mg | ORAL_TABLET | Freq: Four times a day (QID) | ORAL | Status: DC | PRN
Start: 2016-08-20 — End: 2016-08-22
  Administered 2016-08-21 (×2): 4 mg via ORAL
  Filled 2016-08-20 (×2): qty 1

## 2016-08-20 MED ORDER — METOPROLOL SUCCINATE ER 50 MG PO TB24
50.0000 mg | ORAL_TABLET | Freq: Every morning | ORAL | Status: DC
Start: 2016-08-21 — End: 2016-08-22
  Administered 2016-08-21 – 2016-08-22 (×2): 50 mg via ORAL
  Filled 2016-08-20 (×2): qty 1

## 2016-08-20 MED ORDER — ALLOPURINOL 300 MG PO TABS
300.0000 mg | ORAL_TABLET | Freq: Every evening | ORAL | Status: DC
Start: 2016-08-20 — End: 2016-08-22
  Administered 2016-08-20 – 2016-08-21 (×2): 300 mg via ORAL
  Filled 2016-08-20 (×2): qty 1

## 2016-08-20 MED ORDER — FERROUS SULFATE 324 (65 FE) MG PO TBEC
324.0000 mg | DELAYED_RELEASE_TABLET | Freq: Every day | ORAL | Status: DC
Start: 2016-08-21 — End: 2016-08-22
  Administered 2016-08-21 – 2016-08-22 (×2): 324 mg via ORAL
  Filled 2016-08-20 (×2): qty 1

## 2016-08-20 MED ORDER — ACETAMINOPHEN 325 MG PO TABS
650.0000 mg | ORAL_TABLET | Freq: Four times a day (QID) | ORAL | Status: AC | PRN
Start: 2016-08-20 — End: 2016-08-21

## 2016-08-20 MED ORDER — METHYLPREDNISOLONE SODIUM SUCC 125 MG IJ SOLR
125.0000 mg | Freq: Once | INTRAMUSCULAR | Status: AC
Start: 2016-08-20 — End: 2016-08-20
  Administered 2016-08-20: 125 mg via INTRAVENOUS
  Filled 2016-08-20: qty 2

## 2016-08-20 MED ORDER — CLOPIDOGREL BISULFATE 75 MG PO TABS
75.0000 mg | ORAL_TABLET | Freq: Every evening | ORAL | Status: DC
Start: 2016-08-20 — End: 2016-08-22
  Administered 2016-08-20 – 2016-08-21 (×2): 75 mg via ORAL
  Filled 2016-08-20 (×2): qty 1

## 2016-08-20 MED ORDER — ENOXAPARIN SODIUM 40 MG/0.4ML SC SOLN
40.0000 mg | Freq: Every day | SUBCUTANEOUS | Status: DC
Start: 2016-08-21 — End: 2016-08-22
  Administered 2016-08-21 – 2016-08-22 (×2): 40 mg via SUBCUTANEOUS
  Filled 2016-08-20 (×2): qty 0.4

## 2016-08-20 MED ORDER — LEVOTHYROXINE SODIUM 75 MCG PO TABS
75.0000 ug | ORAL_TABLET | Freq: Every day | ORAL | Status: DC
Start: 2016-08-21 — End: 2016-08-22
  Administered 2016-08-21 – 2016-08-22 (×2): 75 ug via ORAL
  Filled 2016-08-20 (×2): qty 1

## 2016-08-20 MED ORDER — ACETAMINOPHEN 325 MG PO TABS
650.0000 mg | ORAL_TABLET | ORAL | Status: DC | PRN
Start: 2016-08-20 — End: 2016-08-22
  Administered 2016-08-21 – 2016-08-22 (×4): 650 mg via ORAL
  Filled 2016-08-20 (×4): qty 2

## 2016-08-20 MED ORDER — ALBUTEROL SULFATE (2.5 MG/3ML) 0.083% IN NEBU
2.5000 mg | INHALATION_SOLUTION | Freq: Four times a day (QID) | RESPIRATORY_TRACT | Status: DC | PRN
Start: 2016-08-20 — End: 2016-08-22

## 2016-08-20 MED ORDER — ISOSORBIDE MONONITRATE ER 30 MG PO TB24
30.0000 mg | ORAL_TABLET | Freq: Every day | ORAL | Status: DC
Start: 2016-08-21 — End: 2016-08-22
  Administered 2016-08-21 – 2016-08-22 (×2): 30 mg via ORAL
  Filled 2016-08-20 (×2): qty 1

## 2016-08-20 MED ORDER — OXYBUTYNIN CHLORIDE 5 MG PO TABS
5.0000 mg | ORAL_TABLET | Freq: Every evening | ORAL | Status: DC
Start: 2016-08-20 — End: 2016-08-22
  Administered 2016-08-20 – 2016-08-21 (×2): 5 mg via ORAL
  Filled 2016-08-20 (×2): qty 1

## 2016-08-20 MED ORDER — NALOXONE HCL 0.4 MG/ML IJ SOLN (WRAP)
0.2000 mg | INTRAMUSCULAR | Status: DC | PRN
Start: 2016-08-20 — End: 2016-08-22

## 2016-08-20 MED ORDER — ACETAMINOPHEN 650 MG RE SUPP
650.0000 mg | RECTAL | Status: DC | PRN
Start: 2016-08-20 — End: 2016-08-22

## 2016-08-20 MED ORDER — CITALOPRAM HYDROBROMIDE 20 MG PO TABS
40.0000 mg | ORAL_TABLET | Freq: Every day | ORAL | Status: DC
Start: 2016-08-20 — End: 2016-08-22
  Administered 2016-08-20 – 2016-08-21 (×2): 40 mg via ORAL
  Filled 2016-08-20 (×2): qty 2

## 2016-08-20 MED ORDER — MONTELUKAST SODIUM 10 MG PO TABS
10.0000 mg | ORAL_TABLET | Freq: Every evening | ORAL | Status: DC
Start: 2016-08-20 — End: 2016-08-22
  Administered 2016-08-20 – 2016-08-21 (×2): 10 mg via ORAL
  Filled 2016-08-20 (×2): qty 1

## 2016-08-20 MED ORDER — PANTOPRAZOLE SODIUM 40 MG PO TBEC
40.0000 mg | DELAYED_RELEASE_TABLET | Freq: Every morning | ORAL | Status: DC
Start: 2016-08-21 — End: 2016-08-22
  Administered 2016-08-21 – 2016-08-22 (×2): 40 mg via ORAL
  Filled 2016-08-20 (×2): qty 1

## 2016-08-20 MED ORDER — ONDANSETRON HCL 4 MG/2ML IJ SOLN
4.0000 mg | Freq: Four times a day (QID) | INTRAMUSCULAR | Status: DC | PRN
Start: 2016-08-20 — End: 2016-08-22

## 2016-08-20 MED ORDER — MORPHINE SULFATE 4 MG/ML IJ/IV SOLN (WRAP)
4.0000 mg | Status: AC | PRN
Start: 2016-08-20 — End: 2016-08-21
  Administered 2016-08-21 (×3): 4 mg via INTRAVENOUS
  Filled 2016-08-20 (×3): qty 1

## 2016-08-20 NOTE — EDIE (Signed)
Delores Edelstein?NOTIFICATION?08/20/2016 18:03?[place], [place]?MRN: [id number]    This patient has registered at the [facility] Emergency Department   For more information visit: [email or web address]   ED Care Guidelines  There are currently no ED Care Guidelines in [place] for this patient. Please check your facility's medical records system.  Care Providers  Brandi Armato has no care providers on record at this time.   Recent Emergency Department Visit Summary  Admit Date Facility [facility] Type Major Type Diagnoses or Chief Complaint   Aug 20, 2016 [facility] [place] [facility]  Emergency      Leg Swelling; Difficulty Breathing      Aug 05, 2016 [facility] [place] [facility]  Emergency     Jun 05, 2016 [facility] [place] [facility]  Emergency      Allergy status to other drugs, medicaments and biological substances status      Personal history of transient ischemic attack (TIA), and cerebral infarction without residual deficits      Other specified metabolic disorders      Atelectasis      Essential (primary) hypertension      Long term (current) use of aspirin      Arthrodesis status      Allergy to other foods      Presence of coronary angioplasty implant and graft      Shortness of breath          Recent Inpatient Visit Summary  Admit Date Facility [facility] Type Major Type Diagnoses or Chief Complaint   Aug 05, 2016 [facility] [place] [facility]  Inpatient      Other seasonal allergic rhinitis      Gout, unspecified      Personal history of pulmonary embolism      Mixed hyperlipidemia      Unspecified urinary incontinence      Presence of coronary angioplasty implant and graft      Dependence on supplemental oxygen      Atherosclerotic heart disease of native coronary artery without angina pectoris      Old myocardial infarction      Gastro-esophageal reflux disease without esophagitis      Jul 12, 2016 [facility]  Inpatient      [id number]          E.D. Visit Count (12 mo.)  Facility Visits   [facility] 2   [facility] 3   [facility] 1   [facility] 1   Total 7   Note: Visits indicate total known visits.      Prescription Monitoring Program  180??- Narcotic Use Score  110??- Sedative Use Score  000??- Stimulant Use Score  - All Scores range from 000-999 with 75% of the population scoring < 200 and on 1% scoring above 650  - The last digit of the narcotic, sedative, and stimulant score indicates the number of active prescriptions of that type  - Higher Use scores correlate with increased prescribers, pharmacies, mg equiv, and overlapping prescriptions   Concerning or unexpectedly high scores should prompt a review of the PMP record; this does not constitute checking PMP for prescribing purposes.    The above information is provided for the sole purpose of patient treatment. Use of this information beyond the terms of Data Sharing Memorandum of Understanding and License Agreement is prohibited. In certain cases not all visits may  be represented. Consult the aforementioned facilities for additional information.   ? 2018 [place], [place] - [email or web address]

## 2016-08-20 NOTE — ED Provider Notes (Signed)
Physician/Midlevel provider first contact with patient: 08/20/16 1850         History     Chief Complaint   Patient presents with   . Chest Pain   . Shortness of Breath   . Nausea   . Leg Swelling     65 y.o. Female with extensive PMHx including HTN, GERD, MI, and left lung PE in 2016 p/w sudden onset of upper L leg pain, rated as 8/10, that began this AM upon waking. Associated with CP (tightness), SOB, nausea, upper back pain, and upper L leg swelling. Pt is currently taking Plavix, and reports taking Coumadin after her prior PE for 6 months. Pt saw her heart doctor 1 month ago where she had stents placed for elevated troponin levels.       The history is provided by the patient. No language interpreter was used.   Chest Pain   Associated symptoms: back pain, nausea and shortness of breath    Shortness of Breath   Associated symptoms: chest pain             Past Medical History:   Diagnosis Date   . Anemia    . Depression    . Disorder of thyroid    . Encounter for blood transfusion    . Gastroesophageal reflux disease    . H/O heart artery stent    . Hyperlipidemia    . Hypertension    . Myocardial infarction    . Seasonal allergic rhinitis        Past Surgical History:   Procedure Laterality Date   . ABDOMINAL SURGERY     . BACK SURGERY     . HERNIA REPAIR     . HYSTERECTOMY     . JOINT REPLACEMENT     . SPINE SURGERY         Family History   Problem Relation Age of Onset   . Heart attack Sister    . Heart attack Maternal Aunt    . Heart attack Paternal Aunt        Social  Social History   Substance Use Topics   . Smoking status: Never Smoker   . Smokeless tobacco: Never Used   . Alcohol use No       .     Allergies   Allergen Reactions   . Codeine    . Iodine    . Red Dye    . Tramadol    . Walnuts [Tree Nuts] Swelling       Home Medications     Med List Status:  Pharmacy Completed Set By: Noralee Chars, RPH at 08/20/2016  9:31 PM                allopurinol (ZYLOPRIM) 300 MG tablet     Take 300 mg by mouth  nightly.        amLODIPine (NORVASC) 10 MG tablet     Take 10 mg by mouth daily.     ASPIRIN LOW DOSE 81 MG EC tablet     Take 81 mg by mouth daily.     atorvastatin (LIPITOR) 80 MG tablet     Take 80 mg by mouth daily.     citalopram (CELEXA) 40 MG tablet     Take 40 mg by mouth daily.     clopidogrel (PLAVIX) 75 mg tablet     Take 75 mg by mouth nightly.        ferrous sulfate  325 (65 FE) MG tablet     Take 325 mg by mouth 2 (two) times daily.        isosorbide mononitrate (IMDUR) 30 MG 24 hr tablet     Take 30 mg by mouth daily.     levothyroxine (SYNTHROID, LEVOTHROID) 75 MCG tablet     Take 75 mcg by mouth Once a day at 6:00am.     metoprolol succinate XL (TOPROL-XL) 50 MG 24 hr tablet     Take 50 mg by mouth every morning.     montelukast (SINGULAIR) 10 MG tablet     Take 10 mg by mouth nightly.     oxybutynin (DITROPAN) 5 MG tablet     Take 5 mg by mouth nightly.     pantoprazole (PROTONIX) 40 MG tablet     Take 40 mg by mouth daily.         PROAIR HFA 108 (90 Base) MCG/ACT inhaler                                                        Review of Systems   Respiratory: Positive for chest tightness and shortness of breath.    Cardiovascular: Positive for chest pain.   Gastrointestinal: Positive for nausea.   Musculoskeletal: Positive for back pain.        Upper left leg pain and swelling.   All other systems reviewed and are negative.      Physical Exam    BP: 140/65, Heart Rate: 75, Temp: 98.5 F (36.9 C), Resp Rate: (!) 32, SpO2: 93 %, Weight: 101.6 kg    Physical Exam   Constitutional: She is oriented to person, place, and time. She appears well-developed and well-nourished.   Obese female who looks older than stated age.  She is quite conversant and cooperative   HENT:   Head: Normocephalic and atraumatic.   Eyes: Lids are normal.   Neck: Phonation normal.   Cardiovascular: Normal rate, regular rhythm, S1 normal and S2 normal.    Pulmonary/Chest: Effort normal and breath sounds normal. No stridor.    Abdominal: Soft. There is no tenderness.   Musculoskeletal:   Diffuse swelling to left leg.  Knee range of motion decreased due to pain.   Neurological: She is alert and oriented to person, place, and time. GCS eye subscore is 4. GCS verbal subscore is 5. GCS motor subscore is 6.   Skin: Skin is warm and dry.   Nursing note and vitals reviewed.      Home Medications      Home medications reviewed by ED MD     Previous Medications    ALLOPURINOL (ZYLOPRIM) 300 MG TABLET    Take 300 mg by mouth nightly.       AMLODIPINE (NORVASC) 10 MG TABLET    Take 10 mg by mouth daily.    ASPIRIN LOW DOSE 81 MG EC TABLET    Take 81 mg by mouth daily.    ATORVASTATIN (LIPITOR) 80 MG TABLET    Take 80 mg by mouth daily.    CITALOPRAM (CELEXA) 40 MG TABLET    Take 40 mg by mouth daily.    CLOPIDOGREL (PLAVIX) 75 MG TABLET    Take 75 mg by mouth nightly.       FERROUS SULFATE 325 (65 FE)  MG TABLET    Take 325 mg by mouth 2 (two) times daily.       ISOSORBIDE MONONITRATE (IMDUR) 30 MG 24 HR TABLET    Take 30 mg by mouth daily.    LEVOTHYROXINE (SYNTHROID, LEVOTHROID) 75 MCG TABLET    Take 75 mcg by mouth Once a day at 6:00am.    METOPROLOL SUCCINATE XL (TOPROL-XL) 50 MG 24 HR TABLET    Take 50 mg by mouth every morning.    MONTELUKAST (SINGULAIR) 10 MG TABLET    Take 10 mg by mouth nightly.    OXYBUTYNIN (DITROPAN) 5 MG TABLET    Take 5 mg by mouth nightly.    PANTOPRAZOLE (PROTONIX) 40 MG TABLET    Take 40 mg by mouth daily.        PROAIR HFA 108 (90 BASE) MCG/ACT INHALER           Nurses notes including past history, allergies, vital signs reviewed.  Past Medical History:   Diagnosis Date   . Anemia    . Depression    . Disorder of thyroid    . Encounter for blood transfusion    . Gastroesophageal reflux disease    . H/O heart artery stent    . Hyperlipidemia    . Hypertension    . Myocardial infarction    . Seasonal allergic rhinitis      Past Surgical History:   Procedure Laterality Date   . ABDOMINAL SURGERY     . BACK SURGERY      . HERNIA REPAIR     . HYSTERECTOMY     . JOINT REPLACEMENT     . SPINE SURGERY       Allergies   Allergen Reactions   . Codeine    . Iodine    . Red Dye    . Tramadol    . Walnuts [Tree Nuts] Swelling       BP 101/60   Pulse 75   Temp 98.5 F (36.9 C) (Oral)   Resp 22   Wt 101.6 kg   SpO2 91%   BMI 43.75 kg/m     O2 Sat in ED is 94% which is borderline.  Pt is being observed on continuous pulsox.    Obese female with 9 stents in her heart which is certainly quite a lot.  She is here with chest discomfort and leg pain.  She also has a remote history of PE.  Plan to check EKG, labs, ultrasound of the left leg, and consider CT anterior chest.  We will premedicate her in case we need to do CT chest.  The time course of her chest discomfort should make a troponin level somewhat reassuring though she remains very high risk.    Patient [name] was seen and treated by me in the ED.   Forest Gleason, MD  6:51 PM    Scribe and MD Attestations      I, Jeanann Lewandowsky, am serving as a scribe to document services personally performed by Forest Gleason, MD, based on the provider's statements to me.     IForest Gleason, MD, personally performed the services documented. Jeanann Lewandowsky is scribing for me on [employer]. I reviewed and confirm the accuracy of the information in this medical record.     Credentials: Jeanann Lewandowsky, scribe     Rendering Provider: Forest Gleason, MD    EKG, Monitoring, and Splints     EKG (interpreted by  ED physician):  Sinus rhythm of 80 bpm, normal intervals, no acute ST abnormality, mild nonspecific T wave abnormality, poor R wave progression.    Splint Check: na    Diagnostic Study Results     The results of the diagnostic studies below were reviewed by the ED provider:    Results     Procedure Component Value Units Date/Time    B-type Natriuretic Peptide [[id number]] Collected:  08/20/16 1831     Updated:  08/20/16 1943     B-Natriuretic Peptide 69 pg/mL     Hepatic function panel (LFT)  [[id number]] Collected:  08/20/16 1831     Updated:  08/20/16 1935     Bilirubin, Total 0.9 mg/dL      Bilirubin, Direct 0.3 mg/dL      Bilirubin, Indirect 0.6 mg/dL      AST (SGOT) 23 U/L      ALT 32 U/L      Alkaline Phosphatase 75 U/L      Protein, Total 7.6 g/dL      Albumin 4.3 g/dL      Globulin 3.3 g/dL      Albumin/Globulin Ratio 1.3    D-Dimer [[id number]] Collected:  08/20/16 1831     Updated:  08/20/16 1934     D-Dimer 0.36 ug/mL FEU     PT/APTT [[id number]] Collected:  08/20/16 1831     Updated:  08/20/16 1933     PT 13.0 sec      PT INR 1.0     PT Anticoag. Given Within 48 hrs. None     PTT 30 sec     Troponin I [[id number]] Collected:  08/20/16 1831    Specimen:  Blood Updated:  08/20/16 1913     Troponin I <0.01 ng/mL     Basic Metabolic Panel [[id number]]  (Abnormal) Collected:  08/20/16 1831    Specimen:  Blood Updated:  08/20/16 1904     Glucose 119 (H) mg/dL      BUN 22 (H) mg/dL      Creatinine 1.1 (H) mg/dL      Calcium 9.9 mg/dL      Sodium 536 mEq/L      Potassium 3.5 mEq/L      Chloride 104 mEq/L      CO2 25 mEq/L      Anion Gap 15.0    GFR [[id number]] Collected:  08/20/16 1831     Updated:  08/20/16 1904     EGFR 49.7    CBC and differential [[id number]] Collected:  08/20/16 1831    Specimen:  Blood from Blood Updated:  08/20/16 1839     WBC 8.14 x10 3/uL      Hgb 13.8 g/dL      Hematocrit 43.3 %      Platelets 224 x10 3/uL      RBC 4.50 x10 6/uL      MCV 89.8 fL      MCH 30.7 pg      MCHC 34.2 g/dL      RDW 13 %      MPV 10.8 fL      Neutrophils 66.9 %      Lymphocytes Automated 21.7 %      Monocytes 9.8 %      Eosinophils Automated 1.0 %      Basophils Automated 0.4 %      Immature Granulocyte 0.2 %      Nucleated RBC 0.0 /100 WBC      Neutrophils Absolute 5.44  x10 3/uL      Abs Lymph Automated 1.77 x10 3/uL      Abs Mono Automated 0.80 x10 3/uL      Abs Eos Automated 0.08 x10 3/uL      Absolute Baso Automated 0.03 x10 3/uL      Absolute Immature Granulocyte 0.02 x10 3/uL      Absolute NRBC 0.00  x10 3/uL           Radiology Results (24 Hour)     Procedure Component Value Units Date/Time    Knee 4+ Views Left [[id number]] Resulted:  08/20/16 2156    Order Status:  Sent Updated:  08/20/16 2156    US Venous Dopp Left Low Extrem [[id number]] Collected:  08/20/16 2049    Order Status:  Completed Updated:  08/20/16 2054    Narrative:       HISTORY: Swelling; evaluate for DVT.    FINDINGS: Wallace Cullens scale, color Doppler and spectral sonography of the left  lower extremity veins was performed. The left lower extremity deep veins  are patent with normal flow and full compressibility. No intraluminal  thrombus is demonstrated.       Impression:        Negative for left lower extremity DVT.    Carlynn Spry, MD   08/20/2016 8:50 PM    Chest AP Portable [[id number]] Collected:  08/20/16 1846    Order Status:  Completed Updated:  08/20/16 1851    Narrative:       HISTORY: Chest pain    COMPARISON: 03/20/2015      Impression:            1. Limited study secondary to large patient body habitus and portable  technique. Mild bibasilar atelectasis.  2. Stable moderate cardiomegaly.    Carlynn Spry, MD   08/20/2016 6:47 PM          Clinical Notes     9:17 PM - Patient was feeling better, though chest pressure is starting to return. I informed her that the ultrasound was negative for clot as was her d dimer, so can safely defer the CT chest. With patients risk and recurrent symptoms along with the fact that she doesn't look to feel that well, I will admit her for observation overnight. Patient is in agreement.     9:19 PM - D/w Dr. Georga Bora who will admit patient.     Critical Care     Total critical care time (excluding procedures):           MDM and ED Course     ED Medication Orders     Start Ordered     Status Ordering Provider    08/20/16 2116 08/20/16 2116  morphine injection 4 mg  Once     Route: Intravenous  Ordered Dose: 4 mg     Last MAR action:  Given Forest Gleason    08/20/16 1914 08/20/16 1913  methylPREDNISolone sodium  succinate (Solu-MEDROL) injection 125 mg  Once     Route: Intravenous  Ordered Dose: 125 mg     Last MAR action:  Given Forest Gleason    08/20/16 1914 08/20/16 1913  diphenhydrAMINE (BENADRYL) injection 25 mg  Once     Route: Intravenous  Ordered Dose: 25 mg     Last MAR action:  Given Hillary Struss C    08/20/16 1914 08/20/16 1913  ondansetron (ZOFRAN) injection 8 mg  Once     Route: Intravenous  Ordered  Dose: 8 mg     Last MAR action:  Given Forest Gleason    08/20/16 1913 08/20/16 1913  morphine injection 4 mg  Once     Route: Intravenous  Ordered Dose: 4 mg     Last MAR action:  Given Natale Barba, Vic Ripper             MDM                 Procedures    Clinical Impression & Disposition     Clinical Impression  Final diagnoses:   Chest pain, unspecified type        ED Disposition     ED Disposition Condition Date/Time Comment    Observation  Fri Aug 20, 2016  9:21 PM Admitting Physician: Herbert Moors [[id number]]   Diagnosis: Chest pain [1610960]   Estimated Length of Stay: < 2 midnights   Tentative Discharge Plan?: Home or Self Care [1]   Patient Class: Observation [104]   Bed request comments: dx chest pain med, tele, obs             New Prescriptions    No medications on file        Treatment Team: Scribe: Barnett Abu, MD  08/21/16 [phone]

## 2016-08-20 NOTE — ED Triage Notes (Signed)
Chest pain sob nausea for one hour. Swollen leg started this morning. Had cardiac stents placed last month and has history of PE

## 2016-08-21 LAB — COMPREHENSIVE METABOLIC PANEL
ALT: 30 U/L (ref 0–55)
AST (SGOT): 19 U/L (ref 5–34)
Albumin/Globulin Ratio: 1.2 (ref 0.9–2.2)
Albumin: 4 g/dL (ref 3.5–5.0)
Alkaline Phosphatase: 75 U/L (ref 37–106)
Anion Gap: 16 — ABNORMAL HIGH (ref 5.0–15.0)
BUN: 28 mg/dL — ABNORMAL HIGH (ref 7–19)
Bilirubin, Total: 0.7 mg/dL (ref 0.2–1.2)
CO2: 23 mEq/L (ref 22–29)
Calcium: 9.4 mg/dL (ref 8.5–10.5)
Chloride: 103 mEq/L (ref 100–111)
Creatinine: 1.2 mg/dL — ABNORMAL HIGH (ref 0.6–1.0)
Globulin: 3.3 g/dL (ref 2.0–3.6)
Glucose: 201 mg/dL — ABNORMAL HIGH (ref 70–100)
Potassium: 4 mEq/L (ref 3.5–5.1)
Protein, Total: 7.3 g/dL (ref 6.0–8.3)
Sodium: 142 mEq/L (ref 136–145)

## 2016-08-21 LAB — GFR: EGFR: 45

## 2016-08-21 LAB — ECG 12-LEAD
Atrial Rate: 79 {beats}/min
P Axis: 62 degrees
P-R Interval: 138 ms
Q-T Interval: 388 ms
QRS Duration: 84 ms
QTC Calculation (Bezet): 444 ms
R Axis: -20 degrees
T Axis: 44 degrees
Ventricular Rate: 79 {beats}/min

## 2016-08-21 LAB — TROPONIN I
Troponin I: <0.01 ng/mL (ref 0.00–0.09)
Troponin I: <0.01 ng/mL (ref 0.00–0.09)

## 2016-08-21 NOTE — Progress Notes (Signed)
MEDICINE PROGRESS NOTE    Date Time: 08/21/16 4:23 PM  Patient Name: [name]  Attending Physician: Herbert Moors, MD    Assessment:   Chest pain possibly costochondritis . MI was ruled out with enzymes.  Abnormal ECG  H/o PE  Hypertension  Obesity  GERD  Hypothyroidism  Hyperlipidemia  CAD S/p stent  Left leg pain possible arthritis    Plan:   Continue the treatment.  Discussed with patient's cardiology.  He will se the patient in the office on Monday.  Patient continues to C/o pain chest. Enzymes normal.   Monitor another day.  Pt and OT evaluation  Possible discharge on 08/22/16.    Case discussed with: Patient,staff and consultant.    Safety Checklist:     DVT prophylaxis:  CHEST guideline (See page [id number]) Chemical   Foley:  Stanley Rn Foley protocol Not present   IVs:  Peripheral IV   PT/OT: Ordered   Daily CBC & or Chem ordered:  SHM/ABIM guidelines (see #5) Yes, due to clinical and lab instability   Reference for approximate charges of common labs: CBC auto diff - $76  BMP - $99  Mg - $79    Lines:     Patient Lines/Drains/Airways Status    Active PICC Line / CVC Line / PIV Line / Drain / Airway / Intraosseous Line / Epidural Line / ART Line / Line / Wound / Pressure Ulcer / NG/OG Tube     Name:   Placement date:   Placement time:   Site:   Days:    Peripheral IV 08/20/16 Left Antecubital  08/20/16    1834    Antecubital    less than 1                 Disposition: (Please see PAF column for Expected D/C Date)   Today's date: 08/21/2016  Admit Date: 08/20/2016  6:15 PM  LOS: 0  Clinical Milestones: Improvement in symptoms  Anticipated discharge needs: None      Subjective     CC: Chest paibn    Interval History/24 hour events: patient was admitted with chest pain. MI was ruled out with enzymes.    HPI/Subjective: Please see H&P.     Review of Systems:     No fever  C/o chest pain  No nausea  No diaphoresis.    Physical Exam:     VITAL SIGNS PHYSICAL EXAM   Temp:  [97.7 F (36.5 C)-98.6 F (37 C)]  97.8 F (36.6 C)  Heart Rate:  [68-90] 81  Resp Rate:  [16-32] 20  BP: (99-158)/(55-80) 150/77  Blood Glucose:201    Telemetry: SR      Intake/Output Summary (Last 24 hours) at 08/21/16 1623  Last data filed at 08/21/16 1400   Gross per 24 hour   Intake              780 ml   Output              100 ml   Net              680 ml    Physical Exam  General: awake, alert X oriented  Cardiovascular: regular rate and rhythm, no murmurs, rubs or gallops  Lungs: clear to auscultation bilaterally, without wheezing, rhonchi, or rales  Abdomen: soft, non-tender, non-distended; no palpable masses,  normoactive bowel sounds  Extremities: no edema  CNS: No focal changes        Meds:  Medications were reviewed:  Current Facility-Administered Medications   Medication Dose Route Frequency   . allopurinol  300 mg Oral QHS   . amLODIPine  10 mg Oral Daily   . aspirin  81 mg Oral Daily   . atorvastatin  80 mg Oral Daily   . citalopram  40 mg Oral Daily   . clopidogrel  75 mg Oral QHS   . enoxaparin  40 mg Subcutaneous Daily   . ferrous sulfate  324 mg Oral Daily   . isosorbide mononitrate  30 mg Oral Daily   . levothyroxine  75 mcg Oral Daily at 0600   . metoprolol succinate XL  50 mg Oral QAM   . montelukast  10 mg Oral QHS   . oxybutynin  5 mg Oral QHS   . pantoprazole  40 mg Oral QAM AC       Labs:     Labs (last 72 hours):      Recent Labs  Lab 08/20/16  1831   WBC 8.14   Hgb 13.8   Hematocrit 40.4   Platelets 224         Recent Labs  Lab 08/20/16  1831   PT 13.0   PT INR 1.0   PTT 30      Recent Labs  Lab 08/21/16  0313 08/20/16  1831   Sodium 142 144   Potassium 4.0 3.5   Chloride 103 104   CO2 23 25   BUN 28* 22*   Creatinine 1.2* 1.1*   Calcium 9.4 9.9   Albumin 4.0 4.3   Protein, Total 7.3 7.6   Bilirubin, Total 0.7 0.9   Alkaline Phosphatase 75 75   ALT 30 32   AST (SGOT) 19 23   Glucose 201* 119*                         Imaging, reviewed         Signed by: Herbert Moors, MD

## 2016-08-21 NOTE — H&P (Signed)
ADMISSION HISTORY AND PHYSICAL EXAM    Date Time: 08/21/16 1:09 AM  Patient Name: [name]  Attending Physician: Herbert Moors, MD  Primary Care Physician: Gray Bernhardt., MD    Patient seen and examined on 08/20/16    CC: sob      Assessment:   Chest pain possibly costochondritis   H/o PE  Hypertension  Obesity  GERD  Hypothyroidism  Hyperlipidemia  CAD S/p stent    Plan:   Admit to tele.  Monitor vitals  Cardiac enzymes  Cardiology consult  Continue with her medicines  Activity as tolerated  D/c when stable.      Disposition:     Today's date: 08/21/2016  Admit Date: 08/20/2016  6:15 PM  Anticipated medical stability for discharge:Yellow - maybe tomorrow  Service status: Observation:patient is stable and improving.  Reason for ongoing hospitalization: Chest pain, leg pain  Anticipated discharge needs: None    History of Presenting Illness:   [name] is a 66 y.o. female who presents to the hospital with the C?o left leg pain and chest pain. No fever. C/o sob. No cough.Left leg pain over upper thigh. No redness.    Past Medical History:     Past Medical History:   Diagnosis Date   . Anemia    . Depression    . Disorder of thyroid    . Encounter for blood transfusion    . Gastroesophageal reflux disease    . H/O heart artery stent    . Hyperlipidemia    . Hypertension    . Myocardial infarction    . Seasonal allergic rhinitis        Available old records reviewed, including:  EPIC    Past Surgical History:     Past Surgical History:   Procedure Laterality Date   . ABDOMINAL SURGERY     . BACK SURGERY     . HERNIA REPAIR     . HYSTERECTOMY     . JOINT REPLACEMENT     . SPINE SURGERY         Family History:   Sister: CAD  Maternal aunt: CAD  Paternal aunt: CAD    Social History:     History   Smoking Status   . Never Smoker   Smokeless Tobacco   . Never Used     History   Alcohol Use No     History   Drug Use No       Allergies:     Allergies   Allergen Reactions   . Codeine    . Iodine    .  Red Dye    . Tramadol    . Walnuts [Tree Nuts] Swelling       Medications:     Current Discharge Medication List      CONTINUE these medications which have NOT CHANGED    Details   allopurinol (ZYLOPRIM) 300 MG tablet Take 300 mg by mouth nightly.         amLODIPine (NORVASC) 10 MG tablet Take 10 mg by mouth daily.  Refills: 1      ASPIRIN LOW DOSE 81 MG EC tablet Take 81 mg by mouth daily.  Refills: 3      atorvastatin (LIPITOR) 80 MG tablet Take 80 mg by mouth daily.      citalopram (CELEXA) 40 MG tablet Take 40 mg by mouth daily.      clopidogrel (PLAVIX) 75 mg tablet Take 75 mg by mouth  nightly.         ferrous sulfate 325 (65 FE) MG tablet Take 325 mg by mouth 2 (two) times daily.         isosorbide mononitrate (IMDUR) 30 MG 24 hr tablet Take 30 mg by mouth daily.      levothyroxine (SYNTHROID, LEVOTHROID) 75 MCG tablet Take 75 mcg by mouth Once a day at 6:00am.      metoprolol succinate XL (TOPROL-XL) 50 MG 24 hr tablet Take 50 mg by mouth every morning.  Refills: 0      pantoprazole (PROTONIX) 40 MG tablet Take 40 mg by mouth daily.          montelukast (SINGULAIR) 10 MG tablet Take 10 mg by mouth nightly.  Refills: 3      oxybutynin (DITROPAN) 5 MG tablet Take 5 mg by mouth nightly.  Refills: 2      PROAIR HFA 108 (90 Base) MCG/ACT inhaler                Method by which medications were confirmed on admission: EPIC    Review of Systems:   All other systems were reviewed and are negative except: SOB,chest pain, no nausea. Leg pain. No headache. C/o back pain    Physical Exam:   Patient Vitals for the past 24 hrs:   BP Temp Temp src Pulse Resp SpO2 Height Weight   08/21/16 0009 118/62 98.3 F (36.8 C) Oral 73 16 92 % - -   08/20/16 2342 - - - - - 96 % - -   08/20/16 2336 132/67 - - - - - - -   08/20/16 2230 158/76 98.3 F (36.8 C) Oral 78 18 93 % 1.524 m (5') 102.6 kg (226 lb 1.6 oz)   08/20/16 2130 101/60 - - 75 22 91 % - -   08/20/16 1915 143/77 - - 68 - - - -   08/20/16 1900 139/63 - - 70 20 - - -    08/20/16 1845 135/80 - - 73 (!) 23 94 % - -   08/20/16 1817 140/65 98.5 F (36.9 C) Oral 75 (!) 32 93 % - 101.6 kg (224 lb)     Body mass index is 44.16 kg/m.    Intake/Output Summary (Last 24 hours) at 08/21/16 0109  Last data filed at 08/21/16 0002   Gross per 24 hour   Intake              120 ml   Output                0 ml   Net              120 ml       General: awake, alert, oriented x 3; no acute distress.  HEENT: perrla, eomi, sclera anicteric  oropharynx clear without lesions, mucous membranes moist  Neck: supple, no lymphadenopathy, no thyromegaly, no JVD, no carotid bruits  Cardiovascular: regular rate and rhythm, no murmurs, rubs or gallops  Lungs: clear to auscultation bilaterally, without wheezing, rhonchi, or rales  Abdomen: soft, non-tender, non-distended; no palpable masses, no hepatosplenomegaly, normoactive bowel sounds, no rebound or guarding  Extremities: no clubbing, cyanosis, or edema  Neuro: cranial nerves grossly intact, strength 5/5 in upper and lower extremities, sensation intact,   Skin: no rashes or lesions noted        Labs:     Results     Procedure Component Value Units Date/Time  Troponin I [[id number]] Collected:  08/21/16 0007    Specimen:  Blood Updated:  08/21/16 0032     Troponin I <0.01 ng/mL     B-type Natriuretic Peptide [[id number]] Collected:  08/20/16 1831     Updated:  08/20/16 1943     B-Natriuretic Peptide 69 pg/mL     Hepatic function panel (LFT) [[id number]] Collected:  08/20/16 1831     Updated:  08/20/16 1935     Bilirubin, Total 0.9 mg/dL      Bilirubin, Direct 0.3 mg/dL      Bilirubin, Indirect 0.6 mg/dL      AST (SGOT) 23 U/L      ALT 32 U/L      Alkaline Phosphatase 75 U/L      Protein, Total 7.6 g/dL      Albumin 4.3 g/dL      Globulin 3.3 g/dL      Albumin/Globulin Ratio 1.3    D-Dimer [[id number]] Collected:  08/20/16 1831     Updated:  08/20/16 1934     D-Dimer 0.36 ug/mL FEU     PT/APTT [[id number]] Collected:  08/20/16 1831     Updated:  08/20/16 1933      PT 13.0 sec      PT INR 1.0     PT Anticoag. Given Within 48 hrs. None     PTT 30 sec     Troponin I [[id number]] Collected:  08/20/16 1831    Specimen:  Blood Updated:  08/20/16 1913     Troponin I <0.01 ng/mL     Basic Metabolic Panel [[id number]]  (Abnormal) Collected:  08/20/16 1831    Specimen:  Blood Updated:  08/20/16 1904     Glucose 119 (H) mg/dL      BUN 22 (H) mg/dL      Creatinine 1.1 (H) mg/dL      Calcium 9.9 mg/dL      Sodium 742 mEq/L      Potassium 3.5 mEq/L      Chloride 104 mEq/L      CO2 25 mEq/L      Anion Gap 15.0    GFR [[id number]] Collected:  08/20/16 1831     Updated:  08/20/16 1904     EGFR 49.7    CBC and differential [[id number]] Collected:  08/20/16 1831    Specimen:  Blood from Blood Updated:  08/20/16 1839     WBC 8.14 x10 3/uL      Hgb 13.8 g/dL      Hematocrit 41.6 %      Platelets 224 x10 3/uL      RBC 4.50 x10 6/uL      MCV 89.8 fL      MCH 30.7 pg      MCHC 34.2 g/dL      RDW 13 %      MPV 10.8 fL      Neutrophils 66.9 %      Lymphocytes Automated 21.7 %      Monocytes 9.8 %      Eosinophils Automated 1.0 %      Basophils Automated 0.4 %      Immature Granulocyte 0.2 %      Nucleated RBC 0.0 /100 WBC      Neutrophils Absolute 5.44 x10 3/uL      Abs Lymph Automated 1.77 x10 3/uL      Abs Mono Automated 0.80 x10 3/uL      Abs Eos Automated 0.08 x10 3/uL  Absolute Baso Automated 0.03 x10 3/uL      Absolute Immature Granulocyte 0.02 x10 3/uL      Absolute NRBC 0.00 x10 3/uL           Imaging personally reviewed, including: Chest AP Portable 08/20/2016  1. Limited study secondary to large patient body habitus and portable  technique. Mild bibasilar atelectasis.  2. Stable moderate cardiomegaly    ECG,08/20/2016  NSR,no acute changes    Safety Checklist  DVT prophylaxis:  CHEST guideline (See page e199S) Chemical   Foley:  [place] Rn Foley protocol Not present   IVs:  Peripheral IV   PT/OT: Ordered   Daily CBC & or Chem ordered:  SHM/ABIM guidelines (see #5) Yes, due to clinical and lab  instability   Reference for approximate charges of common labs: CBC auto diff - $76  BMP - $99  Mg - $79    Signed by: Herbert Moors, MD   [id number], Micheal Likens., MD

## 2016-08-21 NOTE — Progress Notes (Signed)
Case Manager met with patient in room to introduce self and discuss discharge plan. Medicare Focus DX. She was admitted with chest tightness and leg pain. She live at [address] in [place], [place] [address] and was visiting her daughter-in-law Derwood Kaplan in [place] and her granddaughter when she took ill and drove herself to the hospital ER. (Patient's son passed away 6 years ago, a Marine.) Her car is still in the ER parking lot. She was independent prior to admission; lives alone in [place], [place]; has nocturnal oxygen from [place]. She has no hx of SNF, AR, LTC. Her boyfriend Sheila Oats [phone] is able to drive her upon [place] if needed. She has two adult daughters in [place], [place] and [place].      Case management to continue to follow for [place] needs.    Emiliano Dyer, RN  Clinical Case Manager  [phone]     08/21/16 1712   Patient Type   Within 30 Days of Previous Admission? No   Healthcare Decisions   Interviewed: Patient   Orientation/Decision Making Abilities of Patient Alert and Oriented x3, able to make decisions   Additional Emergency Contacts? Derwood Kaplan - daughter-in-law in [place]; Enrique Sack (in [place]) [phone]; Sheila Oats (boyfriend) [phone]   Prior to admission   Prior level of function Independent with ADLs   Home Layout One level   How do you get to your MD appointments? self   How do you get your groceries? self   Who fixes your meals? self   Who does your laundry? self   Who picks up your prescriptions? self   Name of Prior Assisted Living Facility no   Prior SNF admission? (Detail) no   Prior Rehab admission? (Detail) no   Discharge Planning   Anticipated [place] plan discussed with: Same as interviewed   Mode of transportation: Private car (family member)  (Patient's car is in the emergency room parking lot.)   Consults/Providers   Outcome Palliative Care Screen Screened but did not meet criteria for intervention   Correct PCP listed in Epic? No (comment)  (Dr.  Coralie Keens in [place] [phone])   Important Message from Medicare Notice   Patient received 1st IMM Letter? Yes

## 2016-08-21 NOTE — UM Notes (Signed)
PATIENT NAME: [name],[name]   DOB: [birth year]   PMH:   . Anemia   . Depression   . Disorder of thyroid   . Encounter for blood transfusion   . Gastroesophageal reflux disease   . H/O heart artery stent   . Hyperlipidemia   . Hypertension   . Myocardial infarction   . Seasonal allergic rhinitis      ADMITTED ON: 08/20/2016 2121 for Observation   ADMISSION DIAGNOSIS: Chest pain   NOTES TO REVIEWER:    This clinical review is based on/compiled from documentation provided by the treatment team within the patient's medical record.   UTILIZATION REVIEW CONTACT: Cherly Anderson. Barnie Alderman, RN, BSN  Clinical Case Manager  - Utilization Review  [place] [facility]    [address]    [place], [place] [address]  NPI: [id number]  Tax ID: [id number]  Phone: [phone]  Fax: [phone]    Please use fax number [phone] to provide authorization for hospital services or to request additional information.        08/20/2016   Pt is a 66 y.o. female who arrived to the ER (08/20/2016 at 1815) w/ sudden onset LLE pain associated w/ chest pain, SOB, nausea, back pain, and LLE swelling.     ED Triage Vitals      BP 08/20/16 1817 140/65      Heart Rate 08/20/16 1817 75      Resp Rate 08/20/16 1817 (!) 32      Temp 08/20/16 1817 98.5 F (36.9 C)      Temp Source 08/20/16 1817 Oral      SpO2 08/20/16 1817 93 %      Weight 08/20/16 1817 101.6 kg (224 lb)      Height 08/20/16 2230 1.524 m (5')      Pain Score 08/20/16 1817 6     Attending Notes:  [name] is a 67 y.o. female who presents to the hospital with the C?o left leg pain and chest pain. C/o sob. Left leg pain over upper thigh.     Chest pain possibly costochondritis   H/o PE  Hypertension  Obesity  GERD  Hypothyroidism  Hyperlipidemia  CAD S/p stent    Admit to tele.  Monitor vitals  Cardiac enzymes  Cardiology consult  Continue with her medicines  Activity as tolerated   LABS:    08/20/2016 18:31   WBC 8.14   Hemoglobin 13.8   Hematocrit 40.4   Platelet Count 224   Glucose  119 (H)   BUN 22 (H)   Creatinine 1.1 (H)   Sodium 144   Potassium 3.5   Chloride 104   Carbon Dioxide, Whole Blood 25   Calcium 9.9   Anion Gap 15.0   EGFR 49.7   AST Whole Blood 23   ALT, Whole Blood 32   Alkaline Phosphatase 75   Albumin 4.3   Protein, Total 7.6   Globulin 3.3   Albumin/Globulin Ratio 1.3   Bilirubin, Total 0.9   Bilirubin, Direct 0.3   Bilirubin, Indirect 0.6   Troponin I <0.01   B-Natriuretic Peptide 69   PT 13.0   PT INR 1.0   PT Anticoag. Given Within 48 hrs. None   PTT 30   D-Dimer 0.36      MEDS:  Allopurinol 300mg  QHS   Amlodipine 10mg  QD   Lipitor 80mg  QD   Celexa 40mg  QD   Plavix 75mg  QHS   Benadryl 25mg  IV x  1   Solu-Medrol 125mg  IV x 1   Singulair 10mg  QHS   Morphine 4mg  IV x 2   Zofran 8mg  IV x 1   Ditropan 5mg  QHS        IMAGING:  CXR   1. Limited study secondary to large patient body habitus and portable technique. Mild bibasilar atelectasis.  2. Stable moderate cardiomegaly.    US Venous Doppler LLE   Negative for left lower extremity DVT.    XR Knee Lt   No fracture or dislocation of the left knee. Moderate tricompartmental degenerative changes of the left knee.  Chondrocalcinosis of the left knee. No joint effusion. Unremarkable soft tissues.     08/21/2016   Continues on Telemetry Unit, Q4H VS.     Temp:  [97.7 F (36.5 C)-98.6 F (37 C)] 98 F (36.7 C)  Heart Rate:  [68-90] 85  Resp Rate:  [16-32] 18  BP: (99-158)/(55-80) 120/71 LABS:    08/21/2016 03:13   Glucose 201 (H)   BUN 28 (H)   Creatinine 1.2 (H)   Anion Gap 16.0 (H)      MEDS:  Allopurinol 300mg  QHS   AMlodipine 10mg  QD   ASA 81mg  QD   Lipitor 80mg  QD   Celexa 40mg  QD   Plavix 75mg  QHS   Lovenox 40mg  SQ QD   Imdur 30mg  QD   Levothyroxine QD   Singulair 10mg  QHS   Ditropan 5mg  QHS   Protonix 40mg  QAM     Tylenol 650mg  Q4H PRN x 1   Morphine 4mg  IV Q2H PRN x 3   Zofran 4mg  Q6H PRN x 2  IMAGING:  None

## 2016-08-21 NOTE — Plan of Care (Signed)
Problem: Pain  Goal: Pain at adequate level as identified by patient  Outcome: Progressing   08/21/16 1734   Goal/Interventions addressed this shift   Pain at adequate level as identified by patient Reassess pain within 30-60 minutes of any procedure/intervention, per Pain Assessment, Intervention, Reassessment (AIR) Cycle;Offer non-pharmacological pain management interventions       Problem: Moderate/High Fall Risk Score >5  Goal: Patient will remain free of falls  Outcome: Progressing   08/21/16 0845   OTHER   Moderate Risk (6-13) MOD-Apply bed exit alarm if patient is confused

## 2016-08-21 NOTE — Plan of Care (Signed)
Problem: Pain  Goal: Pain at adequate level as identified by patient  Outcome: Not Progressing   08/21/16 0212   Goal/Interventions addressed this shift   Pain at adequate level as identified by patient Identify patient comfort function goal;Assess for risk of opioid induced respiratory depression, including snoring/sleep apnea. Alert healthcare team of risk factors identified.;Reassess pain within 30-60 minutes of any procedure/intervention, per Pain Assessment, Intervention, Reassessment (AIR) Cycle;Evaluate patient's satisfaction with pain management progress;Evaluate if patient comfort function goal is met   Pt complaining of LLE leg pain. PRN morphine administered. Some relief noted.

## 2016-08-22 MED ORDER — DICLOFENAC SODIUM 1 % TD CREA
1.0000 | TOPICAL_CREAM | Freq: Two times a day (BID) | TRANSDERMAL | 0 refills | Status: DC
Start: 2016-08-22 — End: 2016-09-15

## 2016-08-22 MED ORDER — ACETAMINOPHEN 325 MG PO TABS
650.0000 mg | ORAL_TABLET | ORAL | 0 refills | Status: DC | PRN
Start: 2016-08-22 — End: 2017-02-10

## 2016-08-22 NOTE — Final Progress Note (DC Note for stay less than 48 (Signed)
MEDICINE Final PROGRESS NOTE    Date Time: 08/22/16 12:50 PM  Patient Name: [name]  Attending Physician: Herbert Moors, MD    Assessment:   Chest pain possibly costochondritis . MI was ruled out with enzymes.  Abnormal ECG, Discussed with her Cardiology  H/o PE  Hypertension  Obesity  GERD  Hypothyroidism  Hyperlipidemia  CAD S/p stent  Left leg pain possible arthritis    Plan:   Continue the treatment.  Discussed with patient's cardiology.  He will see the patient in the office on Monday.  Patient continues to C/o pain chest. Enzymes normal.   Monitor another day.  Pt and OT evaluation  Patient may be discharged to home today.    Case discussed with: Patient,staff and consultant.    Safety Checklist:     DVT prophylaxis:  CHEST guideline (See page e199S) Chemical   Foley:  [place] Rn Foley protocol Not present   IVs:  Peripheral IV   PT/OT: Ordered   Daily CBC & or Chem ordered:  SHM/ABIM guidelines (see #5) Yes, due to clinical and lab instability   Reference for approximate charges of common labs: CBC auto diff - $76  BMP - $99  Mg - $79    Lines:     Patient Lines/Drains/Airways Status    Active PICC Line / CVC Line / PIV Line / Drain / Airway / Intraosseous Line / Epidural Line / ART Line / Line / Wound / Pressure Ulcer / NG/OG Tube     Name:   Placement date:   Placement time:   Site:   Days:    Peripheral IV 08/20/16 Left Antecubital  08/20/16    1834    Antecubital    less than 1                 Disposition: (Please see PAF column for Expected D/C Date)   Today's date: 08/22/2016  Admit Date: 08/20/2016  6:15 PM  LOS: 0  Clinical Milestones: Improvement in symptoms  Anticipated discharge needs: None      Subjective     CC:No sob    Interval History/24 hour events: patient remains stable.Patient was admitted iwht chest pain. NI was ruled out with enzymes. Discussed with her Cardiology. Discharged to home on 08/22/16.    HPI/Subjective: Please see H&P.     Review of Systems:     No palpitation  No  headache  No abdominal pain  No diaphoresis.    Physical Exam:     VITAL SIGNS PHYSICAL EXAM   Temp:  [97.7 F (36.5 C)-98.2 F (36.8 C)] 98.2 F (36.8 C)  Heart Rate:  [54-81] 54  Resp Rate:  [20-21] 20  BP: (120-150)/(59-77) 127/61  Blood Glucose:201    Telemetry: SR      Intake/Output Summary (Last 24 hours) at 08/22/16 1250  Last data filed at 08/22/16 1040   Gross per 24 hour   Intake              560 ml   Output             1700 ml   Net            -1140 ml    Physical Exam  General: awake, alert X oriented  Cardiovascular: regular rate and rhythm, no murmurs, rubs or gallops  Lungs: clear to auscultation bilaterally, without wheezing, rhonchi, or rales  Abdomen: soft, non-tender, non-distended; no palpable masses,  normoactive bowel sounds  Extremities: no  edema  CNS: No focal changes        Meds:     Medications were reviewed:  Current Facility-Administered Medications   Medication Dose Route Frequency   . allopurinol  300 mg Oral QHS   . amLODIPine  10 mg Oral Daily   . aspirin  81 mg Oral Daily   . atorvastatin  80 mg Oral Daily   . citalopram  40 mg Oral Daily   . clopidogrel  75 mg Oral QHS   . enoxaparin  40 mg Subcutaneous Daily   . ferrous sulfate  324 mg Oral Daily   . isosorbide mononitrate  30 mg Oral Daily   . levothyroxine  75 mcg Oral Daily at 0600   . metoprolol succinate XL  50 mg Oral QAM   . montelukast  10 mg Oral QHS   . oxybutynin  5 mg Oral QHS   . pantoprazole  40 mg Oral QAM AC       Labs:     Labs (last 72 hours):      Recent Labs  Lab 08/20/16  1831   WBC 8.14   Hgb 13.8   Hematocrit 40.4   Platelets 224         Recent Labs  Lab 08/20/16  1831   PT 13.0   PT INR 1.0   PTT 30      Recent Labs  Lab 08/21/16  0313 08/20/16  1831   Sodium 142 144   Potassium 4.0 3.5   Chloride 103 104   CO2 23 25   BUN 28* 22*   Creatinine 1.2* 1.1*   Calcium 9.4 9.9   Albumin 4.0 4.3   Protein, Total 7.3 7.6   Bilirubin, Total 0.7 0.9   Alkaline Phosphatase 75 75   ALT 30 32   AST (SGOT) 19 23    Glucose 201* 119*                         Imaging, reviewed         Signed by: Herbert Moors, MD

## 2016-08-22 NOTE — Progress Notes (Signed)
Patient discharged home. Discharge and prescription instructions given to patient. Patient verbalized understanding. PIV and telemetry monitor removed from patient. Patient states she does not have anyone to pick her up from the hospital. She drove herself and her car is in the ED parking lot. Patient states her boyfriend will be helping her at home. Patient wheeled out by transport.

## 2016-08-22 NOTE — Plan of Care (Signed)
Problem: Safety  Goal: Patient will be free from injury during hospitalization  Outcome: Progressing   08/22/16 0227   Goal/Interventions addressed this shift   Patient will be free from injury during hospitalization  Assess patient's risk for falls and implement fall prevention plan of care per policy;Provide and maintain safe environment;Use appropriate transfer methods;Ensure appropriate safety devices are available at the bedside;Include patient/ family/ care giver in decisions related to safety;Hourly rounding;Provide alternative method of communication if needed (communication boards, writing)     Pt. bed alarm is on, call bell is within reach, bed is in the lowest position.

## 2016-08-22 NOTE — Discharge Instr - AVS First Page (Signed)
Reason for your Hospital Admission:  Chest pain      Instructions for after your discharge:  Follow with your Cardiology on 08/23/16

## 2016-08-22 NOTE — Discharge Instructions (Signed)
Noncardiac Chest Pain    Based on your visit today, the health care provider doesn't know what is causing your chest pain. In most cases, people who come to the emergency department with chest pain don't have a problem with their heart. Instead, the pain is caused by other conditions. These may be problems with the lungs, muscles, bones, digestive tract, nerves, or mental health.  Lung problems   Inflammation around the lungs (pleurisy)   Collapsed lung (pneumothorax)   Fluid around the lungs (pleural effusion)   Lung cancer. This is a rare cause of chest pain.  Muscle or bone problems   Inflamed cartilage between the ribs (pleurisy)   Fibromyalgia   Rheumatoid arthritis  Digestive system problems   Reflux   Stomach ulcer   Spasms of the esophagus   Gall stones   Gallbladder inflammation  Mental health conditions   Panic or anxiety attacks   Emotional distress  Your condition doesn't seem serious and your pain doesn't appear to be coming from your heart. But sometimes the signs of a serious problem take more time to appear. Watch for the warning signs listed below.  Home care  Follow these guidelines when caring for yourself at home:   Rest today and avoid strenuous activity.   Take any prescribed medicine as directed.  Follow-up care  Follow up with your health care provider, or as advised, if you don't start to feel better within 24 hours.  When to seek medical advice  Call your health care provider right away if any of these occur:   A change in the type of pain. Call if it feels different, becomes more serious, lasts longer, or begins to spread into your shoulder, arm, neck, jaw, or back.   Shortness of breath   You feel more pain when you breathe   Cough with dark-colored mucus or blood   Weakness, dizziness, or fainting   Fever of 100.4F (38C) or higher, or as directed by your health care provider   Swelling, pain, or redness in one leg  Date Last Reviewed: 01/01/2013   2000-2016  [employer] [address], [place] [address]. All rights reserved. This information is not intended as a substitute for professional medical care. Always follow your healthcare professional's instructions.

## 2016-08-22 NOTE — PT Eval Note (Signed)
[facility]  Physical Therapy Evaluation    Patient: [name] MRN: [id number]   Unit: [facility]    Bed: V409/W119-14    Medicare ID # - Medicare Sub. Num: [id number]         Discharge Recommendation: Home with no needs    DME Recommended for Discharge: none  Recommended mode of transport: car    Assessment:   Patient is id in all mobility skills. No continued inpatient physical therapy needs.   Discharge therapy.      Co-morbidities/Patient factors affecting plan of care - none    Clinical factors affecting plan of care - none         PMP - Progressive Mobility Protocol   PMP Activity: Step 6 - Walks in Room        Interdisciplinary Communication:   Patient is in bed with alarm activated; call bell within reach.  Updated white communication board in room with patient's current mobility status. Spoke with RN regarding results of evaluation.    Plan:   Goals:  All physical therapy goals met on evaluation.  Discontinue PT.        Education:   Educated patient on role of physical therapy and no further needs for inpatient PT.  Patient verbalized understanding and in agreement with discharge.    Evaluation:   Consult received for [name] for PT evaluation and treatment.  Chart reviewed.  Patient's medical condition is appropriate for Physical Therapy intervention at this time.     Medical Diagnosis: Chest pain, unspecified type [R07.9]    Precautions: none         History of Present Illness: [name] is a 66 y.o. female admitted on 08/20/2016 with chest pain    Patient Active Problem List   Diagnosis   . Chest pain possibly costochondritis    . Coronary artery disease   . HTN (hypertension)   . HLD (hyperlipidemia)   . Gout   . Nephrolithiasis   . History of pulmonary embolism   . Hypothyroid   . Obesity   . Depression   . GERD (gastroesophageal reflux disease)   . Anemia   . OSA (obstructive sleep apnea)     Past Medical History:   Diagnosis Date   . Anemia    . Depression    . Disorder of  thyroid    . Encounter for blood transfusion    . Gastroesophageal reflux disease    . H/O heart artery stent    . Hyperlipidemia    . Hypertension    . Myocardial infarction    . Seasonal allergic rhinitis      Past Surgical History:   Procedure Laterality Date   . ABDOMINAL SURGERY     . BACK SURGERY     . HERNIA REPAIR     . HYSTERECTOMY     . JOINT REPLACEMENT     . SPINE SURGERY                 X-Rays/Tests/Labs:troponin:o,01, [place] LLE: No DVT        Prior Level of Function   Prior level of function: Ambulates / Performs ADL's independently   Baseline Activity Level: Community ambulation   DME Currently at Home: none      Home Living Arrangements Pt lives alone in a one level house with no stairs. Has walk in shower and wall grab bars. Pt drives,is retired and is a Systems analyst  Subjective: Patient is agreeable to participation in the therapy session. Nursing clears patient for therapy.         Pain Assessment: 2/10 left knee        Objective:  Observation of patient/vitals: O2 sat during gait in room air:91%    Cognition: A&Ox3    Musculoskeletal Examination  Gross ROM: Within functional limits B LE  Gross Strength: Within functional limits B LE      Sensation:intact BLEs      Functional Mobility  Transfers: independent  Ambulation: 32' ind  Stairs: N/A      Balance: within functional limits    Participation and Endurance   Participation Effort: excellent   Endurance: Endurance does not limit participation in activity      Treatment:  PT eval      G codes  Based on AM-PAC Current: Mobility, Current Status (R6045): 0 percent impaired, limited or restricted       Goal: Mobility, Goal Status (W0981): 0 percent impaired, limited or restricted       Discharge: Mobility, D/C Status (X9147): 0 percent impaired, limited or restricted    MD cosign:      Time Calculation  PT Received On: 08/22/16  Start Time: 1300  Stop Time: 1320  Time Calculation (min): 20 min          Signature: Kasen Adduci Cori Razor, PT

## 2016-09-13 ENCOUNTER — Ambulatory Visit (RURAL_HEALTH_CENTER): Payer: Self-pay | Admitting: Family

## 2016-09-14 ENCOUNTER — Emergency Department: Payer: Medicare Other

## 2016-09-14 ENCOUNTER — Observation Stay
Admission: EM | Admit: 2016-09-14 | Discharge: 2016-09-15 | Disposition: A | Discharge status: Home / Self Care | Payer: Medicare Other | Attending: Internal Medicine | Admitting: Internal Medicine

## 2016-09-14 DIAGNOSIS — I517 Cardiomegaly: Secondary | ICD-10-CM | POA: Insufficient documentation

## 2016-09-14 DIAGNOSIS — Z9981 Dependence on supplemental oxygen: Secondary | ICD-10-CM | POA: Insufficient documentation

## 2016-09-14 DIAGNOSIS — S161XXA Strain of muscle, fascia and tendon at neck level, initial encounter: Secondary | ICD-10-CM

## 2016-09-14 DIAGNOSIS — D649 Anemia, unspecified: Secondary | ICD-10-CM | POA: Insufficient documentation

## 2016-09-14 DIAGNOSIS — Z7982 Long term (current) use of aspirin: Secondary | ICD-10-CM | POA: Insufficient documentation

## 2016-09-14 DIAGNOSIS — S62629A Displaced fracture of medial phalanx of unspecified finger, initial encounter for closed fracture: Secondary | ICD-10-CM

## 2016-09-14 DIAGNOSIS — Z79899 Other long term (current) drug therapy: Secondary | ICD-10-CM | POA: Insufficient documentation

## 2016-09-14 DIAGNOSIS — S86919A Strain of unspecified muscle(s) and tendon(s) at lower leg level, unspecified leg, initial encounter: Secondary | ICD-10-CM

## 2016-09-14 DIAGNOSIS — Y92512 Supermarket, store or market as the place of occurrence of the external cause: Secondary | ICD-10-CM | POA: Insufficient documentation

## 2016-09-14 DIAGNOSIS — Y92009 Unspecified place in unspecified non-institutional (private) residence as the place of occurrence of the external cause: Secondary | ICD-10-CM | POA: Insufficient documentation

## 2016-09-14 DIAGNOSIS — R42 Dizziness and giddiness: Secondary | ICD-10-CM | POA: Insufficient documentation

## 2016-09-14 DIAGNOSIS — I119 Hypertensive heart disease without heart failure: Secondary | ICD-10-CM | POA: Insufficient documentation

## 2016-09-14 DIAGNOSIS — S8982XA Other specified injuries of left lower leg, initial encounter: Secondary | ICD-10-CM | POA: Insufficient documentation

## 2016-09-14 DIAGNOSIS — S6992XA Unspecified injury of left wrist, hand and finger(s), initial encounter: Secondary | ICD-10-CM | POA: Insufficient documentation

## 2016-09-14 DIAGNOSIS — W1830XA Fall on same level, unspecified, initial encounter: Secondary | ICD-10-CM | POA: Insufficient documentation

## 2016-09-14 DIAGNOSIS — Z7902 Long term (current) use of antithrombotics/antiplatelets: Secondary | ICD-10-CM | POA: Insufficient documentation

## 2016-09-14 DIAGNOSIS — E785 Hyperlipidemia, unspecified: Secondary | ICD-10-CM | POA: Insufficient documentation

## 2016-09-14 DIAGNOSIS — Z955 Presence of coronary angioplasty implant and graft: Secondary | ICD-10-CM | POA: Insufficient documentation

## 2016-09-14 DIAGNOSIS — R55 Syncope and collapse: Secondary | ICD-10-CM | POA: Insufficient documentation

## 2016-09-14 DIAGNOSIS — R079 Chest pain, unspecified: Principal | ICD-10-CM | POA: Insufficient documentation

## 2016-09-14 DIAGNOSIS — G4730 Sleep apnea, unspecified: Secondary | ICD-10-CM | POA: Insufficient documentation

## 2016-09-14 DIAGNOSIS — K219 Gastro-esophageal reflux disease without esophagitis: Secondary | ICD-10-CM | POA: Insufficient documentation

## 2016-09-14 DIAGNOSIS — I252 Old myocardial infarction: Secondary | ICD-10-CM | POA: Insufficient documentation

## 2016-09-14 DIAGNOSIS — I2510 Atherosclerotic heart disease of native coronary artery without angina pectoris: Secondary | ICD-10-CM | POA: Insufficient documentation

## 2016-09-14 DIAGNOSIS — W208XXA Other cause of strike by thrown, projected or falling object, initial encounter: Secondary | ICD-10-CM | POA: Insufficient documentation

## 2016-09-14 LAB — BASIC METABOLIC PANEL
Anion Gap: 12 (ref 5.0–15.0)
BUN: 21 mg/dL — ABNORMAL HIGH (ref 7–19)
CO2: 25 mEq/L (ref 22–29)
Calcium: 9.3 mg/dL (ref 8.5–10.5)
Chloride: 107 mEq/L (ref 100–111)
Creatinine: 1 mg/dL (ref 0.6–1.0)
Glucose: 95 mg/dL (ref 70–100)
Potassium: 4.2 mEq/L (ref 3.5–5.1)
Sodium: 144 mEq/L (ref 136–145)

## 2016-09-14 LAB — CBC AND DIFFERENTIAL
Absolute NRBC: 0 10*3/uL
Basophils Absolute Automated: 0.02 10*3/uL (ref 0.00–0.20)
Basophils Automated: 0.4 %
Eosinophils Absolute Automated: 0.1 10*3/uL (ref 0.00–0.70)
Eosinophils Automated: 2 %
Hematocrit: 38.5 % (ref 37.0–47.0)
Hgb: 13.1 g/dL (ref 12.0–16.0)
Immature Granulocytes Absolute: 0.01 10*3/uL
Immature Granulocytes: 0.2 %
Lymphocytes Absolute Automated: 1.66 10*3/uL (ref 0.50–4.40)
Lymphocytes Automated: 32.7 %
MCH: 31.2 pg (ref 28.0–32.0)
MCHC: 34 g/dL (ref 32.0–36.0)
MCV: 91.7 fL (ref 80.0–100.0)
MPV: 10.3 fL (ref 9.4–12.3)
Monocytes Absolute Automated: 0.6 10*3/uL (ref 0.00–1.20)
Monocytes: 11.8 %
Neutrophils Absolute: 2.69 10*3/uL (ref 1.80–8.10)
Neutrophils: 52.9 %
Nucleated RBC: 0 /100 WBC (ref 0.0–1.0)
Platelets: 175 10*3/uL (ref 140–400)
RBC: 4.2 10*6/uL (ref 4.20–5.40)
RDW: 13 % (ref 12–15)
WBC: 5.08 10*3/uL (ref 3.50–10.80)

## 2016-09-14 LAB — CBC
Absolute NRBC: 0 10*3/uL
Hematocrit: 37.2 % (ref 37.0–47.0)
Hgb: 12.5 g/dL (ref 12.0–16.0)
MCH: 31.1 pg (ref 28.0–32.0)
MCHC: 33.6 g/dL (ref 32.0–36.0)
MCV: 92.5 fL (ref 80.0–100.0)
MPV: 11 fL (ref 9.4–12.3)
Nucleated RBC: 0 /100 WBC (ref 0.0–1.0)
Platelets: 157 10*3/uL (ref 140–400)
RBC: 4.02 10*6/uL — ABNORMAL LOW (ref 4.20–5.40)
RDW: 13 % (ref 12–15)
WBC: 4.89 10*3/uL (ref 3.50–10.80)

## 2016-09-14 LAB — PT AND APTT
PT INR: 0.9 (ref 0.9–1.1)
PT: 12 s — ABNORMAL LOW (ref 12.6–15.0)
PTT: 31 s (ref 23–37)

## 2016-09-14 LAB — GFR: EGFR: 55.5

## 2016-09-14 LAB — TROPONIN I
Troponin I: 0.01 ng/mL (ref 0.00–0.09)
Troponin I: 0.01 ng/mL (ref 0.00–0.09)

## 2016-09-14 MED ORDER — ASPIRIN 81 MG PO CHEW
162.0000 mg | CHEWABLE_TABLET | Freq: Once | ORAL | Status: AC
Start: 2016-09-14 — End: 2016-09-14
  Administered 2016-09-14: 21:00:00 162 mg via ORAL
  Filled 2016-09-14: qty 2

## 2016-09-14 MED ORDER — ATORVASTATIN CALCIUM 20 MG PO TABS
80.0000 mg | ORAL_TABLET | Freq: Every day | ORAL | Status: DC
Start: 2016-09-15 — End: 2016-09-15
  Administered 2016-09-15: 11:00:00 80 mg via ORAL
  Filled 2016-09-14: qty 4

## 2016-09-14 MED ORDER — ACETAMINOPHEN 325 MG PO TABS
650.0000 mg | ORAL_TABLET | Freq: Four times a day (QID) | ORAL | Status: DC | PRN
Start: 2016-09-14 — End: 2016-09-15

## 2016-09-14 MED ORDER — OXYCODONE-ACETAMINOPHEN 5-325 MG PO TABS
1.0000 | ORAL_TABLET | ORAL | Status: DC | PRN
Start: 2016-09-14 — End: 2016-09-15
  Administered 2016-09-15 (×3): 1 via ORAL
  Filled 2016-09-14 (×3): qty 1

## 2016-09-14 MED ORDER — ASPIRIN 81 MG PO TBEC
81.0000 mg | DELAYED_RELEASE_TABLET | Freq: Every day | ORAL | Status: DC
Start: 2016-09-15 — End: 2016-09-15
  Administered 2016-09-15: 11:00:00 81 mg via ORAL
  Filled 2016-09-14: qty 1

## 2016-09-14 MED ORDER — AMLODIPINE BESYLATE 5 MG PO TABS
10.0000 mg | ORAL_TABLET | Freq: Every day | ORAL | Status: DC
Start: 2016-09-15 — End: 2016-09-15
  Administered 2016-09-15: 11:00:00 10 mg via ORAL
  Filled 2016-09-14: qty 2

## 2016-09-14 MED ORDER — NALOXONE HCL 0.4 MG/ML IJ SOLN (WRAP)
0.2000 mg | INTRAMUSCULAR | Status: DC | PRN
Start: 2016-09-14 — End: 2016-09-15

## 2016-09-14 MED ORDER — OXYCODONE-ACETAMINOPHEN 5-325 MG PO TABS
1.0000 | ORAL_TABLET | Freq: Once | ORAL | Status: AC
Start: 2016-09-14 — End: 2016-09-14
  Administered 2016-09-14: 18:00:00 1 via ORAL
  Filled 2016-09-14: qty 1

## 2016-09-14 MED ORDER — CITALOPRAM HYDROBROMIDE 20 MG PO TABS
40.0000 mg | ORAL_TABLET | Freq: Every day | ORAL | Status: DC
Start: 2016-09-15 — End: 2016-09-15
  Administered 2016-09-15: 11:00:00 40 mg via ORAL
  Filled 2016-09-14: qty 2

## 2016-09-14 MED ORDER — ALLOPURINOL 300 MG PO TABS
300.0000 mg | ORAL_TABLET | Freq: Every evening | ORAL | Status: DC
Start: 2016-09-14 — End: 2016-09-15
  Administered 2016-09-14: 300 mg via ORAL
  Filled 2016-09-14: qty 1

## 2016-09-14 MED ORDER — ACETAMINOPHEN 325 MG PO TABS
650.0000 mg | ORAL_TABLET | Freq: Four times a day (QID) | ORAL | Status: AC | PRN
Start: 2016-09-14 — End: 2016-09-15

## 2016-09-14 MED ORDER — OXYBUTYNIN CHLORIDE 5 MG PO TABS
5.0000 mg | ORAL_TABLET | Freq: Every evening | ORAL | Status: DC
Start: 2016-09-14 — End: 2016-09-15
  Administered 2016-09-14: 5 mg via ORAL
  Filled 2016-09-14: qty 1

## 2016-09-14 MED ORDER — FERROUS SULFATE 324 (65 FE) MG PO TBEC
324.0000 mg | DELAYED_RELEASE_TABLET | Freq: Two times a day (BID) | ORAL | Status: DC
Start: 2016-09-15 — End: 2016-09-15
  Administered 2016-09-15: 09:00:00 324 mg via ORAL
  Filled 2016-09-14: qty 1

## 2016-09-14 MED ORDER — PANTOPRAZOLE SODIUM 40 MG PO TBEC
40.0000 mg | DELAYED_RELEASE_TABLET | Freq: Every morning | ORAL | Status: DC
Start: 2016-09-15 — End: 2016-09-15
  Administered 2016-09-15: 09:00:00 40 mg via ORAL
  Filled 2016-09-14: qty 1

## 2016-09-14 MED ORDER — CLOPIDOGREL BISULFATE 75 MG PO TABS
75.0000 mg | ORAL_TABLET | Freq: Every evening | ORAL | Status: DC
Start: 2016-09-14 — End: 2016-09-15
  Administered 2016-09-14: 75 mg via ORAL
  Filled 2016-09-14: qty 1

## 2016-09-14 MED ORDER — MONTELUKAST SODIUM 10 MG PO TABS
10.0000 mg | ORAL_TABLET | Freq: Every evening | ORAL | Status: DC
Start: 2016-09-14 — End: 2016-09-15
  Administered 2016-09-14: 10 mg via ORAL
  Filled 2016-09-14: qty 1

## 2016-09-14 MED ORDER — ONDANSETRON HCL 4 MG/2ML IJ SOLN
4.0000 mg | Freq: Once | INTRAMUSCULAR | Status: AC
Start: 2016-09-14 — End: 2016-09-14
  Administered 2016-09-14: 21:00:00 4 mg via INTRAVENOUS
  Filled 2016-09-14: qty 2

## 2016-09-14 MED ORDER — LEVOTHYROXINE SODIUM 50 MCG PO TABS
75.0000 ug | ORAL_TABLET | Freq: Every day | ORAL | Status: DC
Start: 2016-09-15 — End: 2016-09-15
  Administered 2016-09-15: 06:00:00 75 ug via ORAL
  Filled 2016-09-14: qty 2

## 2016-09-14 MED ORDER — NITROGLYCERIN 2 % TD OINT
0.5000 [in_us] | TOPICAL_OINTMENT | Freq: Once | TRANSDERMAL | Status: AC
Start: 2016-09-14 — End: 2016-09-14
  Administered 2016-09-14: 21:00:00 0.5 [in_us] via TOPICAL
  Filled 2016-09-14: qty 1

## 2016-09-14 MED ORDER — MORPHINE SULFATE 2 MG/ML IJ/IV SOLN (WRAP)
2.0000 mg | Status: AC | PRN
Start: 2016-09-14 — End: 2016-09-15
  Administered 2016-09-14: 2 mg via INTRAVENOUS
  Filled 2016-09-14: qty 1

## 2016-09-14 MED ORDER — SODIUM CHLORIDE 0.9 % IJ SOLN
3.0000 mL | Freq: Three times a day (TID) | INTRAMUSCULAR | Status: DC
Start: 2016-09-14 — End: 2016-09-15
  Administered 2016-09-14 – 2016-09-15 (×2): 3 mL

## 2016-09-14 MED ORDER — ENOXAPARIN SODIUM 40 MG/0.4ML SC SOLN
40.0000 mg | Freq: Every day | SUBCUTANEOUS | Status: DC
Start: 2016-09-15 — End: 2016-09-15

## 2016-09-14 MED ORDER — ISOSORBIDE MONONITRATE ER 30 MG PO TB24
30.0000 mg | ORAL_TABLET | Freq: Every morning | ORAL | Status: DC
Start: 2016-09-15 — End: 2016-09-15
  Administered 2016-09-15: 09:00:00 30 mg via ORAL
  Filled 2016-09-14: qty 1

## 2016-09-14 MED ORDER — METOPROLOL SUCCINATE ER 50 MG PO TB24
50.0000 mg | ORAL_TABLET | Freq: Every morning | ORAL | Status: DC
Start: 2016-09-15 — End: 2016-09-15
  Administered 2016-09-15: 09:00:00 50 mg via ORAL
  Filled 2016-09-14: qty 1

## 2016-09-14 NOTE — H&P (Signed)
[facility]      Patient: [name]  Date: 09/14/2016   DOB: 1950-08-07  Admission Date: 09/14/2016   MRN: [id number]  Attending: Cindee Lame  MD       Chief Complaint   Patient presents with   . Chest Pain   . Hand Pain   . Neck Pain      History Gathered From: Self    HISTORY AND PHYSICAL     [name] is a 66 y.o. female with a PMHx of CAD s/p stent ,MI,HTN, HLD  anemia , depression, GERD , who presented with sudden onset fo chest pain .She states she was in [employer] doings some shopping for her granddaughter . Some laundry hampers fell off the shelf landing on her ,causing pain on her left hand, left shoulder, left side of face , neck pain. When she reached home as she was getting out of her car , she felt dizzy and she fell but did not lose consciousness. At around 6 pm when she was resting in her recliner ,she experienced central chest pain associated with diaphoresis ,sob and nausea and came to ED for further evaluation. She denies fever, chills, urinary symptoms, diarrhea . She had recent stent placement at [facility] a month ago . She complaints of some headache , some pain on left knee and pain on middle finger of left hand       Past Medical History:   Diagnosis Date   . Anemia    . Depression    . Disorder of thyroid    . Encounter for blood transfusion    . Gastroesophageal reflux disease    . H/O heart artery stent    . Hyperlipidemia    . Hypertension    . Myocardial infarction    . Seasonal allergic rhinitis        Past Surgical History:   Procedure Laterality Date   . ABDOMINAL SURGERY     . BACK SURGERY     . CARDIAC SURGERY      stents placed 06/2008   . HERNIA REPAIR     . HYSTERECTOMY     . JOINT REPLACEMENT     . SPINE SURGERY         Prior to Admission medications    Medication Sig Start Date End Date Taking? Authorizing Provider   acetaminophen (TYLENOL) 325 MG tablet Take 2 tablets (650 mg total) by mouth every 4 (four) hours as needed for Pain or Fever (Temperature  greater than 38 C/100.4 F). 08/22/16   Herbert Moors, MD   allopurinol (ZYLOPRIM) 300 MG tablet Take 300 mg by mouth nightly.       [provider]   amLODIPine (NORVASC) 10 MG tablet Take 10 mg by mouth daily. 05/16/16   [provider]   ASPIRIN LOW DOSE 81 MG EC tablet Take 81 mg by mouth daily. 05/15/16   [provider]   atorvastatin (LIPITOR) 80 MG tablet Take 80 mg by mouth daily. 08/16/16   [provider]   citalopram (CELEXA) 40 MG tablet Take 40 mg by mouth daily.    [provider]   clopidogrel (PLAVIX) 75 mg tablet Take 75 mg by mouth nightly.       [provider]   Diclofenac Sodium 1 % Cream Place 1 application onto the skin 2 (two) times daily. 08/22/16   Herbert Moors, MD   ferrous sulfate 325 (65 FE) MG tablet Take 325 mg  by mouth 2 (two) times daily.    03/01/15   [provider]   isosorbide mononitrate (IMDUR) 30 MG 24 hr tablet Take 30 mg by mouth daily.    [provider]   levothyroxine (SYNTHROID, LEVOTHROID) 75 MCG tablet Take 75 mcg by mouth Once a day at 6:00am.    [provider]   metoprolol succinate XL (TOPROL-XL) 50 MG 24 hr tablet Take 50 mg by mouth every morning. 06/22/16   [provider]   montelukast (SINGULAIR) 10 MG tablet Take 10 mg by mouth nightly. 05/16/16   [provider]   oxybutynin (DITROPAN) 5 MG tablet Take 5 mg by mouth nightly. 05/16/16   [provider]   pantoprazole (PROTONIX) 40 MG tablet Take 40 mg by mouth daily.        [provider]   PROAIR HFA 108 [phone] Base) MCG/ACT inhaler  02/18/15   [provider]       Allergies   Allergen Reactions   . Codeine    . Iodine    . Red Dye    . Tramadol    . Walnuts [Tree Nuts] Swelling       CODE STATUS: full code    PRIMARY CARE MD: Coralie Keens, MD    Family History   Problem Relation Age of Onset   . Heart attack Sister    . Heart attack Maternal Aunt    . Heart attack Paternal Aunt         Social History   Substance Use Topics   . Smoking status: Never Smoker   . Smokeless tobacco: Never Used   . Alcohol use No       REVIEW OF SYSTEMS     Ten point review of systems negative or as per HPI and below endorsements.    PHYSICAL EXAM     Vital Signs (most recent): BP 134/60   Pulse (!) 57   Temp 98 F (36.7 C)   Resp 20   Ht 1.524 m (5')   Wt 102.1 kg (225 lb)   SpO2 93%   BMI 43.94 kg/m   Constitutional: No apparent distress.  Patient speaks freely in full sentences.   HEENT: NC/AT, PERRL, no scleral icterus or conjunctival pallor, no nasal discharge, MMM, oropharynx without erythema or exudate  Neck: trachea midline, supple, no cervical or supraclavicular lymphadenopathy or masses  Cardiovascular: RRR, normal S1 S2, no murmurs, gallops, palpable thrills, no JVD, Non-displaced PMI.  Respiratory: Normal rate. No retractions or increased work of breathing. Clear to auscultation and percussion bilaterally.  Gastrointestinal: +BS, non-distended, soft, non-tender, no rebound or guarding, no hepatosplenomegaly  Genitourinary: no suprapubic or costovertebral angle tenderness  Musculoskeletal: tenderness on middle finder left hand and left knee, motor strength grossly normal. No clubbing, edema, or cyanosis. DP and radial pulses 2+ and symmetric.  Capillary refill normal  Skin: no rashes, jaundice or other lesions  Neurologic: EOMI, CN 2-12 grossly intact. no gross motor or sensory deficits, patellar and bicep DTR 2+ and symmetric, downward plantar reflexes  Psychiatric: AAOx3, affect and mood appropriate. The patient is alert, interactive, appropriate.    LABS & IMAGING     Recent Results (from the past 24 hour(s))   CBC with differential    Collection Time: 09/14/16  7:04 PM   Result Value Ref Range    WBC 5.08 3.50 - 10.80 x10 3/uL    Hgb 13.1 12.0 - 16.0 g/dL    Hematocrit  38.5 37.0 - 47.0 %    Platelets 175 140 - 400 x10 3/uL    RBC 4.20 4.20 - 5.40 x10 6/uL    MCV 91.7 80.0 - 100.0 fL    MCH  31.2 28.0 - 32.0 pg    MCHC 34.0 32.0 - 36.0 g/dL    RDW 13 12 - 15 %    MPV 10.3 9.4 - 12.3 fL    Neutrophils 52.9 None %    Lymphocytes Automated 32.7 None %    Monocytes 11.8 None %    Eosinophils Automated 2.0 None %    Basophils Automated 0.4 None %    Immature Granulocyte 0.2 None %    Nucleated RBC 0.0 0.0 - 1.0 /100 WBC    Neutrophils Absolute 2.69 1.80 - 8.10 x10 3/uL    Abs Lymph Automated 1.66 0.50 - 4.40 x10 3/uL    Abs Mono Automated 0.60 0.00 - 1.20 x10 3/uL    Abs Eos Automated 0.10 0.00 - 0.70 x10 3/uL    Absolute Baso Automated 0.02 0.00 - 0.20 x10 3/uL    Absolute Immature Granulocyte 0.01 0 x10 3/uL    Absolute NRBC 0.00 0 x10 3/uL   Basic Metabolic Panel    Collection Time: 09/14/16  7:04 PM   Result Value Ref Range    Glucose 95 70 - 100 mg/dL    BUN 21 (H) 7 - 19 mg/dL    Creatinine 1.0 0.6 - 1.0 mg/dL    Calcium 9.3 8.5 - 11.9 mg/dL    Sodium 147 829 - 562 mEq/L    Potassium 4.2 3.5 - 5.1 mEq/L    Chloride 107 100 - 111 mEq/L    CO2 25 22 - 29 mEq/L    Anion Gap 12.0 5.0 - 15.0   Troponin I    Collection Time: 09/14/16  7:04 PM   Result Value Ref Range    Troponin I 0.01 0.00 - 0.09 ng/mL   PT/APTT    Collection Time: 09/14/16  7:04 PM   Result Value Ref Range    PT 12.0 (L) 12.6 - 15.0 sec    PT INR 0.9 0.9 - 1.1    PT Anticoag. Given Within 48 hrs. None     PTT 31 23 - 37 sec   GFR    Collection Time: 09/14/16  7:04 PM   Result Value Ref Range    EGFR 55.5        MICROBIOLOGY:  Blood Culture: Not done   Urine Culture: not done   Antibiotics Started:not indicated     IMAGING (images personally reviewed and concur with radiologist unless otherwise stated below):  Cervical Spine 2 Or 3 Views   Final Result    Multilevel degenerative changes.      Kennyth Lose, MD    09/14/2016 8:32 PM      Finger Left Minimum 2 Vw   Final Result    Tiny bony density adjacent to the distal aspect of the third   digit middle phalanx, possible small avulsion fracture fragment.   Correlate clinically for point  tenderness at this site.      Kennyth Lose, MD    09/14/2016 8:10 PM      Knee 4+ Views Left   Final Result    No acute fracture identified. Degenerative change and   chondrocalcinosis.      Kennyth Lose, MD    09/14/2016 8:07 PM      XR  Chest  AP Portable   Final Result    Stable cardiomegaly. Possible small left pleural effusion.   Low lung volumes.      Kennyth Lose, MD    09/14/2016 8:05 PM          CARDIAC:  EKG Interpretation (on my review 3):  Sinus bradycardia    Markers:    Recent Labs  Lab 09/14/16  1904   Troponin I 0.01       EMERGENCY DEPARTMENT COURSE:  Orders Placed This Encounter   Procedures   . Splint - Upper Extremity   . XR Chest  AP Portable   . Cervical Spine 2 Or 3 Views   . Finger Left Minimum 2 Vw   . Knee 4+ Views Left   . CBC with differential   . Basic Metabolic Panel   . Troponin I   . PT/APTT   . GFR   . ED Unit Sec Comm Order   . Place (admit) for Observation Services   . Einar Gip ED Bed Request (Observation)       ASSESSMENT & PLAN     [name] is a 66 y.o. female admitted under observation with chest pain.  I started evaluating the patient at  915 pm , September 14, 2016     1. Chest pain - likely musculoskeletal but she has significant cardiac history with  recent stent placement .   Check serial toponins.   Dr Carol Ada was consulted and will see her tomorrow   Tele obs admit   Resume home meds with metoprolol , imdur , aspirin, plavix     2. Depression-   Resume home med- celexa    3. HTN - resume home meds   4. HLD -Resume lipitor   5.GERD - resume PPI   6. Presyncopal episode   Fall precautions   Check orthostatic Vs   Tele monitor   7.  Possible avulsion fracture of middle phalanx middle finger left hand   Splint applied in ER   -follow up orthopedic as outpatient   Pain control     Nutrition- NPO post MN       Safety Checklist  DVT prophylaxis: Chemical   Foley: Not present   IVs:  Peripheral IV   PT/OT: Not needed   Daily CBC & or Chem ordered: Yes, due to clinical and lab  instability       Patient Lines/Drains/Airways Status    Active PICC Line / CVC Line / PIV Line / Drain / Airway / Intraosseous Line / Epidural Line / ART Line / Line / Wound / Pressure Ulcer / NG/OG Tube     Name:   Placement date:   Placement time:   Site:   Days:    Peripheral IV 09/14/16 Left Antecubital  09/14/16    1850    Antecubital    less than 1                Anticipated medical stability for discharge: August, 8 - Morning    Signed,  Cindee Lame, MD  09/14/2016 9:22 PM

## 2016-09-14 NOTE — ED Triage Notes (Signed)
While at [facility] - materials fell from a shelf landing on her - injured left hand, left wrist, hit her head and face, as well as the left shoulder and neck area.  Began having chest pain following the incident - no SOB, nausea or dizziness currently.

## 2016-09-14 NOTE — Plan of Care (Signed)
Problem: Moderate/High Fall Risk Score >5  Goal: Patient will remain free of falls  Outcome: Progressing   09/14/16 2300   OTHER   Moderate Risk (6-13) LOW-Fall Interventions Appropriate for Low Fall Risk;MOD-Initiate Yellow "Fall Risk" magnet communication tool;MOD-(VH Only) Yellow slippers;MOD-(VH Only) Apply yellow "Fall Risk" arm band;MOD-Consider activation of bed alarm if appropriate;MOD-Apply bed exit alarm if patient is confused;MOD-(VH Only) Place "Reset Bed Alarm" sign above bed if in use;MOD-Use of chair-pad alarm when appropriate;MOD-Remain with patient during toileting;MOD-Utilize diversion activities;MOD-Request PT/OT consult order for patients with gait/mobility impairment;MOD-Include family in multidisciplinary POC discussions;MOD-Place Fall Risk level on whiteboard in room

## 2016-09-14 NOTE — EDIE (Signed)
[name]?NOTIFICATION?09/14/2016 17:46?Weeks, [name]?MRN: [id number]    This patient has registered at the [facility]   For more information visit: [email or web address]   Criteria met      5 ED Visits in 12 Months    3 Different Facilities in 90 Days    Security Events  No recent Security Events currently on file    ED Care Guidelines  There are currently no ED Care Guidelines in [name] for this patient. Please check your facility's medical records system.    Care Providers  [name] has no care providers on record at this time.   E.D. Visit Count (12 mo.)  Facility Visits   [facility] 1   [facility] 2   [facility] 3   [facility] 2   [facility] 1   Total 9   Note: Visits indicate total known visits.      Recent Emergency Department Visit Summary  Admit Date Facility [facility] Type Major Type Diagnoses or Chief Complaint   Sep 14, 2016 [facility] [place] [facility]  Emergency      Chest pain; finger injury      Aug 26, 2016 [facility]  Emergency     Aug 20, 2016 [facility] [place] [facility]  Emergency      Leg Swelling; Difficulty Breathing      Chest Discomfort; Leg Swelling; Difficulty Breathing      Leg Swelling      Chest Pain      Shortness of Breath      Nausea      Chest pain, unspecified      Aug 05, 2016 [facility] [place] [facility]  Emergency         Recent Inpatient Visit Summary  Admit Date Facility [facility] Type Major Type Diagnoses or Chief Complaint   Aug 26, 2016 [facility]  Inpatient      Chest pain, unspecified      Other long term (current) drug therapy      Gastro-esophageal reflux disease without esophagitis      Personal history of pulmonary embolism      Hyperlipidemia, unspecified      Essential (primary) hypertension      Anemia,  unspecified      Presence of coronary angioplasty implant and graft      Body mass index (BMI) 40.0-44.9, adult      Long term (current) use of antithrombotics/antiplatelets      Aug 05, 2016 [facility] [place] [facility]  Inpatient      Other seasonal allergic rhinitis      Gout, unspecified      Personal history of pulmonary embolism      Mixed hyperlipidemia      Unspecified urinary incontinence      Presence of coronary angioplasty implant and graft      Dependence on supplemental oxygen      Atherosclerotic heart disease of native coronary artery without angina pectoris      Old myocardial infarction      Gastro-esophageal reflux disease without esophagitis      Jul 12, 2016 [facility]  Inpatient      T82.855A      Jun 28, 2016 [facility]  Inpatient  Atherosclerotic heart disease of native coronary artery without angina pectoris            Prescription Monitoring Program  140??- Narcotic Use Score  070??- Sedative Use Score  000??- Stimulant Use Score  - All Scores range from 000-999 with 75% of the population scoring < 200 and on 1% scoring above 650  - The last digit of the narcotic, sedative, and stimulant score indicates the number of active prescriptions of that type  - Higher Use scores correlate with increased prescribers, pharmacies, mg equiv, and overlapping prescriptions   Concerning or unexpectedly high scores should prompt a review of the PMP record; this does not constitute checking PMP for prescribing purposes.    The above information is provided for the sole purpose of patient treatment. Use of this information beyond the terms of Data Sharing Memorandum of Understanding and License Agreement is prohibited. In certain cases not all visits may be represented. Consult the aforementioned facilities for additional information.   ? 2018 [employer] - [place], [place] -  [email or web address]

## 2016-09-14 NOTE — ED Provider Notes (Signed)
Physician/Midlevel provider first contact with [name]: 09/14/16 1759       [facility] EMERGENCY DEPARTMENT HISTORY AND PHYSICAL EXAM    [name] Name: [name], [name]  Encounter Date:  09/14/2016  Rendering Provider: Domingo Pulse, MD  [name] DOB:  Sep 22, 1950  MRN:  [id number]    History of Presenting Illness     Historian: [name]    66 y.o. female with h/o anemia, depression, GERD, MI, heart artery stent, HLD, and HTN p/w sudden onset of CP that began around 5:15 PM this evening. [name] describes CP as tightness and states it feels "somewhat" like her previous heart sxs. Associated with L middle finger pain, neck pain, L knee pain, HA, and light headed dizziness. [name] was shopping at [facility] today and 5 hampers fell on her causing pain in her neck and L middle finger. When [name] got home she suddenly felt dizzy, fell down, and then began feeling her CP and L knee pain.       PMD:  Coralie Keens, MD    Past Medical History     Past Medical History:   Diagnosis Date   . Anemia    . Depression    . Disorder of thyroid    . Encounter for blood transfusion    . Gastroesophageal reflux disease    . H/O heart artery stent    . Hyperlipidemia    . Hypertension    . Myocardial infarction    . Seasonal allergic rhinitis        Past Surgical History     Past Surgical History:   Procedure Laterality Date   . ABDOMINAL SURGERY     . BACK SURGERY     . CARDIAC SURGERY      stents placed 06/2008   . HERNIA REPAIR     . HYSTERECTOMY     . JOINT REPLACEMENT     . SPINE SURGERY         Family History     Family History   Problem Relation Age of Onset   . Heart attack Sister    . Heart attack Maternal Aunt    . Heart attack Paternal Aunt        Social History     Social History     Social History   . Marital status: Divorced     Spouse name: N/A   . Number of children: N/A   . Years of education: N/A     Social History Main Topics   . Smoking status: Never Smoker   . Smokeless tobacco: Never Used   . Alcohol use No   . Drug use: No   . Sexual  activity: Not on file     Other Topics Concern   . Not on file     Social History Narrative   . No narrative on file       Home Medications     Home medications reviewed by ED MD     Previous Medications    ACETAMINOPHEN (TYLENOL) 325 MG TABLET    Take 2 tablets (650 mg total) by mouth every 4 (four) hours as needed for Pain or Fever (Temperature greater than 38 C/100.4 F).    ALLOPURINOL (ZYLOPRIM) 300 MG TABLET    Take 300 mg by mouth nightly.       AMLODIPINE (NORVASC) 10 MG TABLET    Take 10 mg by mouth daily.    ASPIRIN LOW DOSE 81 MG EC TABLET  Take 81 mg by mouth daily.    ATORVASTATIN (LIPITOR) 80 MG TABLET    Take 80 mg by mouth daily.    CITALOPRAM (CELEXA) 40 MG TABLET    Take 40 mg by mouth daily.    CLOPIDOGREL (PLAVIX) 75 MG TABLET    Take 75 mg by mouth nightly.       DICLOFENAC SODIUM 1 % CREAM    Place 1 application onto the skin 2 (two) times daily.    FERROUS SULFATE 325 (65 FE) MG TABLET    Take 325 mg by mouth 2 (two) times daily.       ISOSORBIDE MONONITRATE (IMDUR) 30 MG 24 HR TABLET    Take 30 mg by mouth daily.    LEVOTHYROXINE (SYNTHROID, LEVOTHROID) 75 MCG TABLET    Take 75 mcg by mouth Once a day at 6:00am.    METOPROLOL SUCCINATE XL (TOPROL-XL) 50 MG 24 HR TABLET    Take 50 mg by mouth every morning.    MONTELUKAST (SINGULAIR) 10 MG TABLET    Take 10 mg by mouth nightly.    OXYBUTYNIN (DITROPAN) 5 MG TABLET    Take 5 mg by mouth nightly.    PANTOPRAZOLE (PROTONIX) 40 MG TABLET    Take 40 mg by mouth daily.        PROAIR HFA 108 (90 BASE) MCG/ACT INHALER           Review of Systems     Constitutional:  No fever  Eyes: No discharge   ENT: No ST  CV:  + CP   Resp:  No SOB or cough  GI: No abd pain, N, V, D  GU: No dysuria  MS:  + L knee pain, + L middle finger pain, + Neck pain  Skin: No rash  Neuro:  + HA, + Dizziness  Psych:  No behavior changes  All other systems reviewed and negative    Physical Exam     BP 134/60   Pulse (!) 57   Temp 98 F (36.7 C)   Resp 20   Ht 5' (1.524 m)    Wt 102.1 kg   SpO2 93%   BMI 43.94 kg/m     GENERAL: Vital signs reviewed. No apparent distress    HEAD:  Normal cephalic, atraumatic  EYES: pupils equal round reactive, extra ocular muscles intact  MOUTH: No trismus, uvula midline, oropharynx clear, mucous membranes moist  NECK: Supple. Diffuse cervical tenderness to palpitation  HEART: regular rate, no murmur  LUNGS: Bilateral clear to auscultation  ABDOMEN: Soft, Non-tender,nondistended  EXTREMITIES: No edema, no calf tenderness, no cyanosis  MUSCULOSKELETAL: Left chest wall tenderness, L knee tenderness, L 3rd finger middle phalanx tenderness.  NEUROLOGIC: Alert and orientated, normal speech, moves all extremities  SKIN:  Warm, Dry, No rash    ED Medications Administered     ED Medication Orders     Start Ordered     Status Ordering Provider    09/14/16 2055 09/14/16 2054  aspirin chewable tablet 162 mg  Once     Route: Oral  Ordered Dose: 162 mg     Last MAR action:  Given Domingo Pulse    09/14/16 2055 09/14/16 2054  nitroglycerin (NITRO-BID) 2 % ointment 0.5 inch  Once     Route: Topical  Ordered Dose: 0.5 inch     Last MAR action:  Ointment Applied Domingo Pulse    09/14/16 2055 09/14/16 2054  ondansetron (ZOFRAN) injection 4 mg  Once     Route: Intravenous  Ordered Dose: 4 mg     Last MAR action:  Given Domingo Pulse    09/14/16 1813 09/14/16 1812  oxyCODONE-acetaminophen (PERCOCET) 5-325 MG per tablet 1 tablet  Once     Route: Oral  Ordered Dose: 1 tablet     Last MAR action:  Given Ardie Dragoo M          Orders Placed During This Encounter     Orders Placed This Encounter   Procedures   . Splint - Upper Extremity   . XR Chest  AP Portable   . Cervical Spine 2 Or 3 Views   . Finger Left Minimum 2 Vw   . Knee 4+ Views Left   . CBC with differential   . Basic Metabolic Panel   . Troponin I   . [name]/APTT   . GFR   . ED Unit Sec Comm Order   . Place (admit) for Observation Services   . Fair Thelma Barge ED Bed Request (Observation)       Diagnostic  Study Results     The results of the diagnostic studies below were reviewed by the ED provider:    Labs  Results     Procedure Component Value Units Date/Time    Troponin I [[id number]] Collected:  09/14/16 1904    Specimen:  Blood Updated:  09/14/16 1929     Troponin I 0.01 ng/mL     Basic Metabolic Panel [[id number]]  (Abnormal) Collected:  09/14/16 1904    Specimen:  Blood Updated:  09/14/16 1923     Glucose 95 mg/dL      BUN 21 (H) mg/dL      Creatinine 1.0 mg/dL      Calcium 9.3 mg/dL      Sodium 782 mEq/L      Potassium 4.2 mEq/L      Chloride 107 mEq/L      CO2 25 mEq/L      Anion Gap 12.0    GFR [[id number]] Collected:  09/14/16 1904     Updated:  09/14/16 1923     EGFR 55.5    [name]/APTT [[id number]]  (Abnormal) Collected:  09/14/16 1904     Updated:  09/14/16 1919     [name] 12.0 (L) sec      [name] INR 0.9     [name] Anticoag. Given Within 48 hrs. None     PTT 31 sec     CBC with differential [[id number]] Collected:  09/14/16 1904    Specimen:  Blood from Blood Updated:  09/14/16 1909     WBC 5.08 x10 3/uL      Hgb 13.1 g/dL      Hematocrit 01.0 %      Platelets 175 x10 3/uL      RBC 4.20 x10 6/uL      MCV 91.7 fL      MCH 31.2 pg      MCHC 34.0 g/dL      RDW 13 %      MPV 10.3 fL      Neutrophils 52.9 %      Lymphocytes Automated 32.7 %      Monocytes 11.8 %      Eosinophils Automated 2.0 %      Basophils Automated 0.4 %      Immature Granulocyte 0.2 %      Nucleated RBC 0.0 /100 WBC      Neutrophils Absolute 2.69 x10 3/uL  Abs Lymph Automated 1.66 x10 3/uL      Abs Mono Automated 0.60 x10 3/uL      Abs Eos Automated 0.10 x10 3/uL      Absolute Baso Automated 0.02 x10 3/uL      Absolute Immature Granulocyte 0.01 x10 3/uL      Absolute NRBC 0.00 x10 3/uL           Radiologic Studies  Radiology Results (24 Hour)     Procedure Component Value Units Date/Time    Cervical Spine 2 Or 3 Views [[id number]] Collected:  09/14/16 2027    Order Status:  Completed Updated:  09/14/16 2036    Narrative:       CLINICAL HISTORY: Trauma  with pain.    FINDINGS: Odontoid, reverse water's, AP and lateral views of the  cervical spine. Comparison is made to 10/16/2008.    Vertebral body heights are preserved. Multilevel disc space narrowing  and endplate osteophyte formation greatest at C5-C6 and C6-C7 with  progression since prior. No acute fracture identified radiographically.  No subluxation. Bilateral facet degenerative change. Odontoid process is  intact. Prevertebral soft tissues are within normal.      Impression:        Multilevel degenerative changes.    Kennyth Lose, MD   09/14/2016 8:32 PM    Finger Left Minimum 2 Vw [[id number]] Collected:  09/14/16 2007    Order Status:  Completed Updated:  09/14/16 2014    Narrative:       CLINICAL HISTORY: Trauma with pain.    FINDINGS: AP view of the left hand. Oblique and lateral views of the  third finger.    On the oblique view a tiny bony density is seen dorsal to the distal  aspect of the third digit middle phalanx, possible small avulsion  fracture fragment. Mild degenerative change at the distal  interphalangeal joints. No dislocation. No destructive bone change.      Impression:        Tiny bony density adjacent to the distal aspect of the third  digit middle phalanx, possible small avulsion fracture fragment.  Correlate clinically for point tenderness at this site.    Kennyth Lose, MD   09/14/2016 8:10 PM    Knee 4+ Views Left [[id number]] Collected:  09/14/16 2006    Order Status:  Completed Updated:  09/14/16 2011    Narrative:       CLINICAL HISTORY: Trauma with pain.    FINDINGS: AP, crosstable lateral and oblique views of the left knee.  Comparison is made to left knee x-ray 08/20/2016.    No acute fracture or dislocation identified. Tricompartment degenerative  changes with joint space narrowing and osteophyte formation  redemonstrated. Chondrocalcinosis again seen. No joint effusion  identified.      Impression:        No acute fracture identified. Degenerative change  and  chondrocalcinosis.    Kennyth Lose, MD   09/14/2016 8:07 PM    XR Chest  AP Portable [[id number]] Collected:  09/14/16 2004    Order Status:  Completed Updated:  09/14/16 2009    Narrative:       CLINICAL HISTORY: Chest pain    FINDINGS: Portable sitting AP view of the chest obtained. Comparison:  08/20/2016    Low lung volumes. No focal airspace opacity identified. No vascular  congestion. Possible small left pleural effusion. Cardiac silhouette is  enlarged but not significantly changed. Mild tortuosity of the aorta.  Impression:        Stable cardiomegaly. Possible small left pleural effusion.  Low lung volumes.    Kennyth Lose, MD   09/14/2016 8:05 PM          Scribe and MD Attestations     I, Domingo Pulse, DO, personally performed the services documented. Jeanann Lewandowsky is scribing for me on Pippins,Karletta. I reviewed and confirm the accuracy of the information in this medical record.    I, Jeanann Lewandowsky, am serving as a scribe to document services personally performed by Domingo Pulse, DO, based on the provider's statements to me.     Credentials: Jeanann Lewandowsky, scribe    Rendering Provider: Domingo Pulse, DO    Monitors, EKG, Critical Care, and Splints     EKG (interpreted by ED physician): 58 bpm sinus rhythm, normal axis and intervals, inverted T wave in V3.  Cardiac Monitor (interpreted by ED physician): na       MDM and Clinical Notes     Notes:[name] presents status post, but sounds to be minor trauma.  [name] has possible evulsion fracture, finger was placed in a splint.  Other screening x-rays are without evidence of fracture or dislocation.  [name] has significant cardiac history presents complaining of chest pain, is not related to trauma.  Given her history, [name] will be admitted for cardiac observation.  [name] was admitted to medical service with cardiology consult.      Consults:    9:08 PM - D/w Dr. Ollen Bowl, cardiology, who will consult [name].    9:10 PM - D/w Dr. Leonides Sake,  hospitalist, who accepts [name] for admission to a tele obs bed.      Diagnosis and Disposition     Clinical Impression  1. Strain of neck muscle, initial encounter    2. Strain of knee, unspecified laterality, initial encounter    3. Closed avulsion fracture of middle phalanx of finger, initial encounter    4. Chest pain, unspecified type        Disposition  ED Disposition     ED Disposition Condition Date/Time Comment    Observation  Tue Sep 14, 2016  9:12 PM Admitting Physician: Cindee Lame [[id number]]   Diagnosis: Chest pain [[id number]]   Estimated Length of Stay: < 2 midnights   Tentative Discharge Plan?: Home or Self Care [1]   [name] Class: Observation [104]   Bed request comments: dx chest pain - med, tele, observation            Prescriptions       New Prescriptions    No medications on file            Domingo Pulse, DO  09/14/16 2212

## 2016-09-15 LAB — ECG 12-LEAD
Atrial Rate: 58 {beats}/min
Atrial Rate: 52 {beats}/min
P Axis: 53 degrees
P Axis: 31 degrees
P-R Interval: 152 ms
P-R Interval: 150 ms
Q-T Interval: 452 ms
Q-T Interval: 490 ms
QRS Duration: 76 ms
QRS Duration: 78 ms
QTC Calculation (Bezet): 443 ms
QTC Calculation (Bezet): 455 ms
R Axis: 10 degrees
R Axis: -9 degrees
T Axis: 16 degrees
T Axis: -2 degrees
Ventricular Rate: 58 {beats}/min
Ventricular Rate: 52 {beats}/min

## 2016-09-15 LAB — LIPID PANEL
Cholesterol / HDL Ratio: 5
Cholesterol: 139 mg/dL (ref 0–199)
HDL: 28 mg/dL — ABNORMAL LOW (ref 40–9999)
LDL Calculated: 76 mg/dL (ref 0–99)
Triglycerides: 173 mg/dL — ABNORMAL HIGH (ref 34–149)
VLDL Calculated: 35 mg/dL (ref 10–40)

## 2016-09-15 LAB — HEMOLYSIS INDEX: Hemolysis Index: 34 — ABNORMAL HIGH (ref 0–18)

## 2016-09-15 LAB — TROPONIN I: Troponin I: 0.01 ng/mL (ref 0.00–0.09)

## 2016-09-15 MED ORDER — OXYCODONE-ACETAMINOPHEN 5-325 MG PO TABS
1.0000 | ORAL_TABLET | Freq: Three times a day (TID) | ORAL | 0 refills | Status: DC | PRN
Start: 2016-09-15 — End: 2017-02-10

## 2016-09-15 NOTE — Plan of Care (Signed)
Problem: Safety  Goal: Patient will be free from injury during hospitalization  Outcome: Progressing   09/15/16 1313   Goal/Interventions addressed this shift   Patient will be free from injury during hospitalization  Assess patient's risk for falls and implement fall prevention plan of care per policy;Provide and maintain safe environment;Use appropriate transfer methods;Ensure appropriate safety devices are available at the bedside;Include patient/ family/ care giver in decisions related to safety;Hourly rounding;Assess for patients risk for elopement and implement Elopement Risk Plan per policy   Call bell is within reach,low bedside, and 2 side rails up. Bed alarm is on. Steady gaits noted. Pt denies any dizziness or SOB at time.  P: will continue to monitor VS closely, and maintain fall prevention safety plan of care.     Problem: Pain  Goal: Pain at adequate level as identified by patient  Outcome: Progressing   09/15/16 1313   Goal/Interventions addressed this shift   Pain at adequate level as identified by patient Identify patient comfort function goal;Assess for risk of opioid induced respiratory depression, including snoring/sleep apnea. Alert healthcare team of risk factors identified.;Assess pain on admission, during daily assessment and/or before any "as needed" intervention(s);Evaluate if patient comfort function goal is met;Evaluate patient's satisfaction with pain management progress   Pt states that she still having chest pain on-off. Given to pt po percocet for chest pain r/t neck pain. Awaiting for cardiologist. There is no additional further test. 12 leads EKG tomorrow am.

## 2016-09-15 NOTE — Discharge Instr - Diet (Signed)
Cardiac  diet

## 2016-09-15 NOTE — Discharge Instr - Activity (Signed)
As tolerated

## 2016-09-15 NOTE — Discharge Summary (Signed)
[facility]      Patient: [name]  Admission Date: 09/14/2016   DOB: January 04, 1951  Discharge Date: 09/15/2016    MRN: [id number]  Discharge Attending: Norton Pastel MD   Referring Physician: Coralie Keens, MD  PCP: Coralie Keens, MD       DISCHARGE SUMMARY     Discharge Information   Admission Diagnosis:   Chest Pain    Discharge Diagnosis:   Patient Active Problem List    Diagnosis Date Noted   . Chest pain possibly costochondritis  10/07/2012   . Coronary artery disease 10/07/2012   . HTN (hypertension) 10/07/2012   . HLD (hyperlipidemia) 10/07/2012   . Gout 10/07/2012   . Nephrolithiasis 10/07/2012   . History of pulmonary embolism 10/07/2012   . Hypothyroid 10/07/2012   . Obesity 10/07/2012   . Depression 10/07/2012   . GERD (gastroesophageal reflux disease) 10/07/2012   . Anemia 10/07/2012   . OSA (obstructive sleep apnea) 10/07/2012        Admission Condition: good  Discharge Condition: good  Functional Status: Patient is independent with mobility/ambulation, transfers, ADL's, IADL's.    Discharge Medications:     Medication List      START taking these medications    oxyCODONE-acetaminophen 5-325 MG per tablet  Commonly known as:  PERCOCET  Take 1 tablet by mouth every 8 (eight) hours as needed for Pain.        CONTINUE taking these medications    acetaminophen 325 MG tablet  Commonly known as:  TYLENOL  Take 2 tablets (650 mg total) by mouth every 4 (four) hours as needed for Pain or Fever (Temperature greater than 38 C/100.4 F).     allopurinol 300 MG tablet  Commonly known as:  ZYLOPRIM     amLODIPine 10 MG tablet  Commonly known as:  NORVASC     ASPIRIN LOW DOSE 81 MG EC tablet  Generic drug:  aspirin     atorvastatin 80 MG tablet  Commonly known as:  LIPITOR     citalopram 40 MG tablet  Commonly known as:  CeleXA     clopidogrel 75 mg tablet  Commonly known as:  PLAVIX     ferrous sulfate 325 (65 FE) MG tablet     isosorbide mononitrate 30 MG 24 hr tablet  Commonly known as:  IMDUR      levothyroxine 75 MCG tablet  Commonly known as:  SYNTHROID, LEVOTHROID     metoprolol succinate XL 50 MG 24 hr tablet  Commonly known as:  TOPROL-XL     montelukast 10 MG tablet  Commonly known as:  SINGULAIR     oxybutynin 5 MG tablet  Commonly known as:  DITROPAN     pantoprazole 40 MG tablet  Commonly known as:  PROTONIX     PROAIR HFA 108 (90 Base) MCG/ACT inhaler  Generic drug:  albuterol           Where to Get Your Medications      You can get these medications from any pharmacy    Bring a paper prescription for each of these medications   oxyCODONE-acetaminophen 5-325 MG per tablet             Hospital Course   Presentation History   66 y.o. female with a PMHx of CAD s/p stent ,MI,HTN, HLD  anemia , depression, GERD , who presented with sudden onset fo chest pain .She states she was in [employer] some shopping for her  granddaughter . Some laundry hampers fell off the shelf landing on her ,causing pain on her left hand, left shoulder, left side of face , neck pain. When she reached home as she was getting out of her car , she felt dizzy and she fell but did not lose consciousness. At around 6 pm when she was resting in her recliner ,she experienced central chest pain associated with diaphoresis ,sob and nausea and came to ED for further evaluation. She denies fever, chills, urinary symptoms, diarrhea . She had recent stent placement at [facility] a month ago . She complaints of some headache , some pain on left knee and pain on middle finger of left hand     See HPI for details.    Hospital Course (0 Days)   1. Chest pain most likely musculoskeletal as had reportedly trauma in Left hand, left shoulder left side of the face and neck in [facility] yesterday   Concerning was her significant cardiac history with recent stent placement at [facility]  Neg serial toponins.   EKG x2 no change  Patient's primary cardiologist Dr Ollen Bowl, was called and since MI ruled out and no EKG changes advised  for discharge home with follow-up with him in 1-2 days  Patient's pain is controlled with when necessary Percocet and that will be prescribed.    2. Depression Continue celexa    3.Injury  Reportedly yesterday at [employer]  Her x-rays cervical spine, chest x-ray, left knee x-ray without any fracture, but  Left 3rd digit X ray suspicious of middle phalanx possible small avulsion fracture   Immobilized  Will give her Hand surgery f/u  Continue Percocet when necessary    4.  Sleep apnea on home O2  at bedtime    5. Disposition  Patient is stable for discharge home  Discussed with cardiology Dr Ollen Bowl, and okayed for discharge home today.      Procedures/Imaging:   Cervical Spine 2 Or 3 Views   Final Result    Multilevel degenerative changes.      Kennyth Lose, MD    09/14/2016 8:32 PM      Finger Left Minimum 2 Vw   Final Result    Tiny bony density adjacent to the distal aspect of the third   digit middle phalanx, possible small avulsion fracture fragment.   Correlate clinically for point tenderness at this site.      Kennyth Lose, MD    09/14/2016 8:10 PM      Knee 4+ Views Left   Final Result    No acute fracture identified. Degenerative change and   chondrocalcinosis.      Kennyth Lose, MD    09/14/2016 8:07 PM      XR Chest  AP Portable   Final Result    Stable cardiomegaly. Possible small left pleural effusion.   Low lung volumes.      Kennyth Lose, MD    09/14/2016 8:05 PM          Treatment Team:   Attending Provider: Len Childs, MD  Consulting Physician: Cindee Lame, MD       Best Practices   Was the patient admitted with either a CHF Exacerbation or Pneumonia? No     Progress Note/Physical Exam at Discharge     Subjective:   Feels OK  Feels sore in her neck left face left knee and left hand  CP better but still persistent better with Percocet  No fever, no cough      Vitals:    09/15/16 0805 09/15/16 0837 09/15/16 1031 09/15/16 1150   BP: 120/46 112/50 119/51 122/58   Pulse: (!) 57 (!) 55   (!) 48   Resp: 20   18   Temp: 97.3 F (36.3 C)   97.9 F (36.6 C)   TempSrc: Oral   Oral   SpO2: 92%   94%   Weight:       Height:         Obese, no respiratory distress  General: NAD, AAOx3  HEENT: perrla, eomi, sclera anicteric, OP: Clear, MMM  Neck: supple, FROM, no LAD  Cardiovascular: RRR, no m/r/g  Lungs: CTAB, no w/r/r  Abdomen: soft, +BS, NT/ND, no masses, no g/r  Extremities: no C/C/E,   Left Knee no swelling ROM intact, left middle finger immobilised  Skin: no rashes or lesions noted  Neuro: Grossly Non-focal       Diagnostics     Labs/Studies Pending at Discharge: No    Last Labs     Recent Labs  Lab 09/14/16  2307 09/14/16  1904   WBC 4.89 5.08   RBC 4.02* 4.20   Hgb 12.5 13.1   Hematocrit 37.2 38.5   MCV 92.5 91.7   Platelets 157 175         Recent Labs  Lab 09/14/16  1904   Sodium 144   Potassium 4.2   Chloride 107   CO2 25   BUN 21*   Creatinine 1.0   Glucose 95   Calcium 9.3       Microbiology Results     None           Patient Instructions   Discharge Diet: regular diet and cardiac diet  Discharge Activity:  activity as tolerated    Follow Up Appointment:  Follow-up Information     Coralie Keens, MD Follow up.    Specialty:  Family Medicine  Contact information:  [address]  [place] [address]  [phone]             Thurnell Garbe, MD. Schedule an appointment as soon as possible for a visit in 1 day(s).    Specialties:  Interventional Cardiology, Cardiology  Why:  Call to make an appointment to see him in 1-2 days  Contact information:  [address]  [place] [address]  [phone]             Virgilio Frees., MD. Schedule an appointment as soon as possible for a visit in 1 day(s).    Specialties:  Orthopaedic Surgery, Hand Surgery  Why:  Call to make an appointment for evaluation of left hand middle finger injury  Contact information:  [address]  [address] [place] [address]  [phone]                    Time spent examining patient,  discussing with patient/family regarding hospital course, chart review, reconciling medications and discharge planning: 25 minutes.    Signed,  Norton Pastel, MD  3:21 PM 09/15/2016

## 2016-09-15 NOTE — Discharge Instructions (Signed)
Acetaminophen; Oxycodone tablets  Brand Names: Endocet, Percocet, Primlev, Roxicet  What is this medicine?  ACETAMINOPHEN; OXYCODONE (a set a MEE noe fen; ox i KOE done) is a pain reliever. It is used to treat moderate to severe pain.  How should I use this medicine?  Take this medicine by mouth with a full glass of water. Follow the directions on the prescription label. You can take it with or without food. If it upsets your stomach, take it with food. Take your medicine at regular intervals. Do not take it more often than directed.  A special MedGuide will be given to you by the pharmacist with each prescription and refill. Be sure to read this information carefully each time.  Talk to your pediatrician regarding the use of this medicine in children. Special care may be needed.  What side effects may I notice from receiving this medicine?  Side effects that you should report to your doctor or health care professional as soon as possible:   allergic reactions like skin rash, itching or hives, swelling of the face, lips, or tongue   breathing problems   confusion   redness, blistering, peeling or loosening of the skin, including inside the mouth   signs and symptoms of liver injury like dark yellow or brown urine; general ill feeling or flu-like symptoms; light-colored stools; loss of appetite; nausea; right upper belly pain; unusually weak or tired; yellowing of the eyes or skin   signs and symptoms of low blood pressure like dizziness; feeling faint or lightheaded, falls; unusually weak or tired   trouble passing urine or change in the amount of urine  Side effects that usually do not require medical attention (report to your doctor or health care professional if they continue or are bothersome):   constipation   dry mouth   nausea, vomiting   tiredness  What may interact with this medicine?  This medicine may interact with the following medications:   alcohol   antihistamines for allergy, cough and  cold   antiviral medicines for HIV or AIDS   atropine   certain antibiotics like clarithromycin, erythromycin, linezolid, rifampin   certain medicines for anxiety or sleep   certain medicines for bladder problems like oxybutynin, tolterodine   certain medicines for depression like amitriptyline, fluoxetine, sertraline   certain medicines for fungal infections like ketoconazole, itraconazole, voriconazole   certain medicines for migraine headache like almotriptan, eletriptan, frovatriptan, naratriptan, rizatriptan, sumatriptan, zolmitriptan   certain medicines for nausea or vomiting like dolasetron, ondansetron, palonosetron   certain medicines for Parkinson's disease like benztropine, trihexyphenidyl   certain medicines for seizures like phenobarbital, phenytoin, primidone   certain medicines for stomach problems like dicyclomine, hyoscyamine   certain medicines for travel sickness like scopolamine   diuretics   general anesthetics like halothane, isoflurane, methoxyflurane, propofol   ipratropium   local anesthetics like lidocaine, pramoxine, tetracaine   MAOIs like Carbex, Eldepryl, Marplan, Nardil, and Parnate   medicines that relax muscles for surgery   methylene blue   nilotinib   other medicines with acetaminophen   other narcotic medicines for pain or cough   phenothiazines like chlorpromazine, mesoridazine, prochlorperazine, thioridazine  What if I miss a dose?  If you miss a dose, take it as soon as you can. If it is almost time for your next dose, take only that dose. Do not take double or extra doses.  Where should I keep my medicine?  Keep out of the reach of children. This medicine   can be abused. Keep your medicine in a safe place to protect it from theft. Do not share this medicine with anyone. Selling or giving away this medicine is dangerous and against the law.  This medicine may cause accidental overdose and death if it taken by other adults, children, or pets. Mix any  unused medicine with a substance like cat litter or coffee grounds. Then throw the medicine away in a sealed container like a sealed bag or a coffee can with a lid. Do not use the medicine after the expiration date.  Store at room temperature between 20 and 25 degrees C (68 and 77 degrees F).  What should I tell my health care provider before I take this medicine?  They need to know if you have any of these conditions:   brain tumor   Crohn's disease, inflammatory bowel disease, or ulcerative colitis   drug abuse or addiction   head injury   heart or circulation problems   if you often drink alcohol   kidney disease or problems going to the bathroom   liver disease   lung disease, asthma, or breathing problems   an unusual or allergic reaction to acetaminophen, oxycodone, other opioid analgesics, other medicines, foods, dyes, or preservatives   pregnant or trying to get pregnant   breast-feeding  What should I watch for while using this medicine?  Tell your doctor or health care professional if your pain does not go away, if it gets worse, or if you have new or a different type of pain. You may develop tolerance to the medicine. Tolerance means that you will need a higher dose of the medication for pain relief. Tolerance is normal and is expected if you take this medicine for a long time.  Do not suddenly stop taking your medicine because you may develop a severe reaction. Your body becomes used to the medicine. This does NOT mean you are addicted. Addiction is a behavior related to getting and using a drug for a non-medical reason. If you have pain, you have a medical reason to take pain medicine. Your doctor will tell you how much medicine to take. If your doctor wants you to stop the medicine, the dose will be slowly lowered over time to avoid any side effects.  There are different types of narcotic medicines (opiates). If you take more than one type at the same time or if you are taking another  medicine that also causes drowsiness, you may have more side effects. Give your health care provider a list of all medicines you use. Your doctor will tell you how much medicine to take. Do not take more medicine than directed. Call emergency for help if you have problems breathing or unusual sleepiness.  Do not take other medicines that contain acetaminophen with this medicine. Always read labels carefully. If you have questions, ask your doctor or pharmacist.  If you take too much acetaminophen get medical help right away. Too much acetaminophen can be very dangerous and cause liver damage. Even if you do not have symptoms, it is important to get help right away.  You may get drowsy or dizzy. Do not drive, use machinery, or do anything that needs mental alertness until you know how this medicine affects you. Do not stand or sit up quickly, especially if you are an older patient. This reduces the risk of dizzy or fainting spells. Alcohol may interfere with the effect of this medicine. Avoid alcoholic drinks.  The medicine will   cause constipation. Try to have a bowel movement at least every 2 to 3 days. If you do not have a bowel movement for 3 days, call your doctor or health care professional.  Your mouth may get dry. Chewing sugarless gum or sucking hard candy, and drinking plenty or water may help. Contact your doctor if the problem does not go away or is severe.  NOTE:This sheet is a summary. It may not cover all possible information. If you have questions about this medicine, talk to your doctor, pharmacist, or health care provider. Copyright 2018 [employer]

## 2016-09-15 NOTE — Discharge Instr - AVS First Page (Signed)
Reason for your Hospital Admission:  Chest pain MI ruled out  Left middle finger injury      Instructions for after your discharge:  Make an appointment with Hand surgery  See your cardiologist in 1-2 days

## 2016-09-15 NOTE — UM Notes (Signed)
This clinical review is based on/compiled from documentation provided by the treatment team within the patient's medical record.    Raymond Gurney, RN, BSN  Clinical Case Manager  [facility]    [address]    [place], [place] [address]  NPI: [id number]  Tax ID: [id number]  Phone: [phone]  Fax: [phone]    Please use fax number [phone] to provide authorization for hospital services or to request additional information.             PATIENT NAME: [name],[name]  DOB: 1950-10-20  PMH:   Diagnosis   . Anemia   . Depression   . Disorder of thyroid   . Encounter for blood transfusion   . Gastroesophageal reflux disease   . H/O heart artery stent   . Hyperlipidemia   . Hypertension   . Myocardial infarction   . Seasonal allergic rhinitis     ADMITTED ON: 09/14/2016 @ 6393 66 year old female presents with sudden onset of CP described as tightness associated with L middle finger pain, neck pain, left knee pain, HA and dizziness    ED Triage Vitals   Enc Vitals Group      BP 09/14/16 1752 151/78      Heart Rate 09/14/16 1752 60      Resp Rate 09/14/16 1752 20      Temp 09/14/16 1752 98 F (36.7 C)      Temp Source 09/14/16 2214 Oral      SpO2 09/14/16 1752 95 %      Weight 09/14/16 1752 102.1 kg (225 lb)      Height 09/14/16 1752 1.524 m (5')      Pain Score 09/14/16 1752 6       ADMISSION DIAGNOSIS:   09/14/16 2112  Place (admit) for Observation Services (Adult Observation Admit Panel (FO)) Once    Status:    Question Answer Comment   Admitting Physician SHRESTHA, PRAGYA    Diagnosis Chest pain    Estimated Length of Stay < 2 midnights    Tentative Discharge Plan? Home or Self Care    Patient Class Observation    Bed request comments dx chest pain - med, tele, observation            09/14/2016  NOTES:   [name] is a 66 y.o. female admitted under observation with chest pain.  I started evaluating the patient at  915 pm , September 14, 2016     1. Chest pain - likely musculoskeletal but she has  significant cardiac history with  recent stent placement .   Check serial toponins.   Dr Carol Ada was consulted and will see her tomorrow   Tele obs admit   Resume home meds with metoprolol , imdur , aspirin, plavix     2. Depression-   Resume home med- celexa    3. HTN - resume home meds   4. HLD -Resume lipitor   5.GERD - resume PPI   6. Presyncopal episode   Fall precautions   Check orthostatic Vs   Tele monitor   7. Possible avulsion fracture of middle phalanx middle finger left hand   Splint applied in ER   -follow up orthopedic as outpatient   Pain control      LABS:      09/14/2016 19:04   WBC 5.08   Hemoglobin 13.1   Hematocrit 38.5   Platelet Count 175   RBC 4.20  MCV 91.7   MCH, POC 31.2   MCHC 34.0   RDW 13   MPV 10.3   Neutrophils 52.9   Lymphocytes Automated 32.7   Monocytes 11.8   Eosinophils Automated 2.0   Basophils Automated 0.4   Immature Granulocyte 0.2   Nucleated RBC 0.0   Neutro # 2.69   Abs Lymph Automated 1.66   Abs Eos Automated 0.10   Abs Mono Automated 0.60   Absolute Baso Automated 0.02   Absolute Immature Granulocyte 0.01   Absolute NRBC 0.00   Glucose 95   BUN 21 (H)   Creatinine 1.0   Sodium 144   Potassium 4.2   Chloride 107   Carbon Dioxide, Whole Blood 25   Calcium 9.3   Anion Gap 12.0   EGFR 55.5   Troponin I 0.01   PT 12.0 (L)   PT INR 0.9   PT Anticoag. Given Within 48 hrs. None   PTT 31       MEDS:   Zyloprim 300mg   ASA 162mg   Plavix 75mg   Nitro-bid  Zofran 4mg  IV  Ditropan 5mg   Percocet   Morphine 2mg  IV      IMAGING:  X-ray spine  Multilevel degenerative changes.    Chest -xray   Stable cardiomegaly. Possible small left pleural effusion.  Low lung volumes.    X-finger   Tiny bony density adjacent to the distal aspect of the third  digit middle phalanx, possible small avulsion fracture fragment.  Correlate clinically for point tenderness at this site.    09/15/2016: Patient remains on telemetry unit, vitals every 4 hours    Vitals:    09/15/16 0805 09/15/16 0837 09/15/16 1031  09/15/16 1150   BP: 120/46 112/50 119/51 122/58   Pulse: (!) 57 (!) 55  (!) 48   Resp: 20   18   Temp: 97.3 F (36.3 C)   97.9 F (36.6 C)   TempSrc: Oral   Oral   SpO2: 92%   94%   Weight:       Height:           Meds:  Current Facility-Administered Medications   Medication Dose Route Frequency   . allopurinol  300 mg Oral QHS   . amLODIPine  10 mg Oral Daily   . aspirin  81 mg Oral Daily   . atorvastatin  80 mg Oral Daily   . citalopram  40 mg Oral Daily   . clopidogrel  75 mg Oral QHS   . enoxaparin  40 mg Subcutaneous Daily   . ferrous sulfate  324 mg Oral BID Meals   . isosorbide mononitrate  30 mg Oral QAM   . levothyroxine  75 mcg Oral Daily at 0600   . metoprolol succinate XL  50 mg Oral QAM   . montelukast  10 mg Oral QHS   . oxybutynin  5 mg Oral QHS   . pantoprazole  40 mg Oral QAM AC   . sodium chloride (PF)  3 mL Intracatheter Q8H     Current Facility-Administered Medications   Medication Dose Route Frequency Last Rate     Current Facility-Administered Medications   Medication Dose Route   . acetaminophen  650 mg Oral   . naloxone  0.2 mg Intravenous   . oxyCODONE-acetaminophen  1 tablet Oral

## 2016-09-15 NOTE — Plan of Care (Signed)
Provided pt with discharge instructions/prescriptions. Educated pt on follow up(cardiologist,ortho, and PCP within 1-2 days) and MyChart. Pt denied any further questions/concerns. IV D/C'd, catheter intact. No s/s bleeding noted. All belong brought with pt including prescription. Tele monitor returned. Pt left unit in wheelchair with transport. Pt being discharged home.

## 2016-10-19 ENCOUNTER — Emergency Department: Payer: Medicare Other

## 2016-10-19 ENCOUNTER — Emergency Department
Admission: EM | Admit: 2016-10-19 | Discharge: 2016-10-19 | Disposition: A | Discharge status: Home / Self Care | Payer: Medicare Other | Attending: Emergency Medicine | Admitting: Emergency Medicine

## 2016-10-19 DIAGNOSIS — Z79899 Other long term (current) drug therapy: Secondary | ICD-10-CM | POA: Insufficient documentation

## 2016-10-19 DIAGNOSIS — I252 Old myocardial infarction: Secondary | ICD-10-CM | POA: Insufficient documentation

## 2016-10-19 DIAGNOSIS — I10 Essential (primary) hypertension: Secondary | ICD-10-CM | POA: Insufficient documentation

## 2016-10-19 DIAGNOSIS — Z955 Presence of coronary angioplasty implant and graft: Secondary | ICD-10-CM | POA: Insufficient documentation

## 2016-10-19 DIAGNOSIS — Z7982 Long term (current) use of aspirin: Secondary | ICD-10-CM | POA: Insufficient documentation

## 2016-10-19 DIAGNOSIS — K219 Gastro-esophageal reflux disease without esophagitis: Secondary | ICD-10-CM | POA: Insufficient documentation

## 2016-10-19 DIAGNOSIS — Z7902 Long term (current) use of antithrombotics/antiplatelets: Secondary | ICD-10-CM | POA: Insufficient documentation

## 2016-10-19 DIAGNOSIS — E785 Hyperlipidemia, unspecified: Secondary | ICD-10-CM | POA: Insufficient documentation

## 2016-10-19 DIAGNOSIS — M5412 Radiculopathy, cervical region: Secondary | ICD-10-CM | POA: Insufficient documentation

## 2016-10-19 DIAGNOSIS — E079 Disorder of thyroid, unspecified: Secondary | ICD-10-CM | POA: Insufficient documentation

## 2016-10-19 LAB — CBC AND DIFFERENTIAL
Absolute NRBC: 0 10*3/uL
Basophils Absolute Automated: 0.03 10*3/uL (ref 0.00–0.20)
Basophils Automated: 0.5 %
Eosinophils Absolute Automated: 0.17 10*3/uL (ref 0.00–0.70)
Eosinophils Automated: 2.8 %
Hematocrit: 38.6 % (ref 37.0–47.0)
Hgb: 13 g/dL (ref 12.0–16.0)
Immature Granulocytes Absolute: 0.02 10*3/uL
Immature Granulocytes: 0.3 %
Lymphocytes Absolute Automated: 2.41 10*3/uL (ref 0.50–4.40)
Lymphocytes Automated: 39 %
MCH: 30.7 pg (ref 28.0–32.0)
MCHC: 33.7 g/dL (ref 32.0–36.0)
MCV: 91 fL (ref 80.0–100.0)
MPV: 11 fL (ref 9.4–12.3)
Monocytes Absolute Automated: 0.59 10*3/uL (ref 0.00–1.20)
Monocytes: 9.5 %
Neutrophils Absolute: 2.96 10*3/uL (ref 1.80–8.10)
Neutrophils: 47.9 %
Nucleated RBC: 0 /100 WBC (ref 0.0–1.0)
Platelets: 174 10*3/uL (ref 140–400)
RBC: 4.24 10*6/uL (ref 4.20–5.40)
RDW: 13 % (ref 12–15)
WBC: 6.18 10*3/uL (ref 3.50–10.80)

## 2016-10-19 LAB — BASIC METABOLIC PANEL
Anion Gap: 11 (ref 5.0–15.0)
BUN: 17 mg/dL (ref 7–19)
CO2: 22 mEq/L (ref 22–29)
Calcium: 8.8 mg/dL (ref 8.5–10.5)
Chloride: 109 mEq/L (ref 100–111)
Creatinine: 0.8 mg/dL (ref 0.6–1.0)
Glucose: 111 mg/dL — ABNORMAL HIGH (ref 70–100)
Potassium: 3.9 mEq/L (ref 3.5–5.1)
Sodium: 142 mEq/L (ref 136–145)

## 2016-10-19 LAB — ECG 12-LEAD
Atrial Rate: 57 {beats}/min
P Axis: 60 degrees
P-R Interval: 150 ms
Q-T Interval: 424 ms
QRS Duration: 78 ms
QTC Calculation (Bezet): 412 ms
R Axis: 17 degrees
T Axis: 18 degrees
Ventricular Rate: 57 {beats}/min

## 2016-10-19 LAB — GLUCOSE WHOLE BLOOD - POCT: Whole Blood Glucose POCT: 97 mg/dL (ref 70–100)

## 2016-10-19 LAB — PT AND APTT
PT INR: 0.9 (ref 0.9–1.1)
PT: 12.6 s (ref 12.6–15.0)
PTT: 31 s (ref 23–37)

## 2016-10-19 LAB — TROPONIN I: Troponin I: <0.01 ng/mL (ref 0.00–0.09)

## 2016-10-19 LAB — GFR: EGFR: >60

## 2016-10-19 MED ORDER — ACETAMINOPHEN 325 MG PO TABS
650.0000 mg | ORAL_TABLET | Freq: Once | ORAL | Status: AC
Start: 2016-10-19 — End: 2016-10-19
  Administered 2016-10-19: 04:00:00 650 mg via ORAL
  Filled 2016-10-19: qty 2
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
## 2016-10-19 NOTE — Discharge Instructions (Signed)
Dear [name]:    I appreciate your choosing the [facility] for your healthcare needs, and hope your visit today was EXCELLENT.    Instructions:  Please follow-up with Dr. Juanetta Snow, you primary care, later this week. Take Tylenol as needed for pain.       Cervical Radiculopathy    You have been seen for a cervical radiculopathy.    Your spine has bones called "vertebrae." In between the bones there are soft cushions. These are called "disks." The disks keep the vertebrae from rubbing against each other. Inside of each disk is a thick, jelly-like substance called the "nucleus pulposus." Sometimes when the spine is injured, a disk is damaged and the nucleus pulposus leaks out of the disk. This is called a "disk herniation." As people age, the disks get brittle and delicate. If this happens, you might have a disk herniation. This is possible even if you don't remember getting injured.    Sometimes a herniated disk presses on a nerve in the spine. This causes pain near the herniated disk. Since you have symptoms in your neck and arm, the problem is in the cervical (neck) spine. Other problems can cause a radiculopathy (nerve pain), including a narrowed opening where the nerves come out.    Some symptoms of a cervical radiculopathy are:     Pain down one of your arms.   A burning feeling down your arm.   Numbness (pins and needles or loss of feeling) in your arm.   In serious cases, your arm may feel weak.    This problem is often treated with rest, physical therapy, and medication for the swelling and pain. If these treatments don't work, surgery may be needed. Sometimes taking steroids (like Prednisone) for a few days can help the pain.    We don't believe your condition is dangerous right now. However, you need to be careful. Sometimes a problem that seems small can get serious later. Therefore, it is very important for you to come back here or go to the nearest Emergency  Department if you don't get better or your symptoms get worse.     You may have been referred to get an MRI of your spine or an EMG. These tests look at your problem more closely.    Follow up with your doctor or the referral doctor as soon as possible.    YOU SHOULD SEEK MEDICAL ATTENTION IMMEDIATELY, EITHER HERE OR AT THE NEAREST EMERGENCY DEPARTMENT, IF ANY OF THE FOLLOWING OCCURS:     You lose bowel or bladder control (you soil or wet yourself).   You feel week or can't use your arm(s).   The medication doesn't help the pain.   You have a fever (temperature higher than 100.51F or 38C) or shaking chills.   You have serious pain over one bone (vertebra) in your neck.    If you can't follow up with your doctor, or if at any time you feel you need to be rechecked or seen again, come back here or go to the nearest emergency department.                 Return to the Emergency Department for any worsening symptoms or concerns.    Below is some information that our patients often find helpful.    We wish you good health and please do not hesitate to contact us if we can ever be of any assistance.  Sincerely,  Melida Gimenez, MD  [facility]    ________________________________________________________________    If you do not continue to improve or your condition worsens, please contact your doctor or return immediately to the Emergency Department.    Thank you for choosing [facility] for your emergency care needs.  We strive to provide EXCELLENT care to you and your family.      DOCTOR REFERRALS  Call [phone] if you need any further referrals and we can help you find a primary care doctor or specialist.  Also, available online at:  [email or web address]    YOUR CONTACT INFORMATION  Before leaving please check with registration to make sure we have an up-to-date contact number.  You can call registration at [phone] to update your  information.  For questions about your hospital bill, please call [phone].  For questions about your Emergency Dept Physician bill please call [phone].      FREE HEALTH SERVICES  If you need help with health or social services, please call 2-1-1 for a free referral to resources in your area.  2-1-1 is a free service connecting people with information on health insurance, free clinics, pregnancy, mental health, dental care, food assistance, housing, and substance abuse counseling.  Also, available online at:  http://www.[place].org    MEDICAL RECORDS AND TESTS  Certain laboratory test results do not come back the same day, for example urine cultures.   We will contact you if other important findings are noted.  Radiology films are often reviewed again to ensure accuracy.  If there is any discrepancy, we will notify you.      Please call [phone] to pick up a complimentary CD of any radiology studies performed.  If you or your doctor would like to request a copy of your medical records, please call [phone].      ORTHOPEDIC INJURY   Please know that significant injuries can exist even when an initial x-ray is read as normal or negative.  This can occur because some fractures (broken bones) are not initially visible on x-rays.  For this reason, close outpatient follow-up with your primary care doctor or bone specialist (orthopedist) is required.    MEDICATIONS AND FOLLOWUP  Please be aware that some prescription medications can cause drowsiness.  Use caution when driving or operating machinery.    The examination and treatment you have received in our Emergency Department is provided on an emergency basis, and is not intended to be a substitute for your primary care physician.  It is important that your doctor checks you again and that you report any new or remaining problems at that time.      24 HOUR PHARMACIES  [facility] - [address], [place], [place] [address] (1.4 miles, 7 minutes)  [facility]  - [address] [place], [place] [address] (6.5 miles, 13 minutes)  Handout with directions available on request

## 2016-10-19 NOTE — EDIE (Signed)
[name]?NOTIFICATION?10/19/2016 01:16?Weeks, [name]?MRN: [id number]    This patient has registered at the [facility] Emergency Department   For more information visit: [email or web address]   Criteria met      5 ED Visits in 12 Months    3 Different Facilities in 90 Days    Security Events  No recent Security Events currently on file    ED Care Guidelines  There are currently no ED Care Guidelines in [name] for this patient. Please check your facility's medical records system.    Care Providers  [name] has no care providers on record at this time.   E.D. Visit Count (12 mo.)  Facility Visits   [facility] 2   [facility] 2   [facility] 2   [facility] 3   [facility] 1   Total 10   Note: Visits indicate total known visits.      Recent Emergency Department Visit Summary  Admit Date Facility [facility] Type Major Type Diagnoses or Chief Complaint   Oct 19, 2016 [facility] [place] [facility]  Emergency      right arm pain      Oct 12, 2016 [facility]  Emergency     Sep 14, 2016 [facility] [place] [facility] [place] [facility]  Emergency      Chest pain; finger injury      Neck Pain      Chest Pain      Hand Pain      Strain of unspecified muscle(s) and tendon(s) at lower leg level, unspecified leg, initial encounter      Displaced fracture of medial phalanx of unspecified finger, initial encounter for closed fracture      Strain of muscle, fascia and tendon at neck level, initial encounter      Chest pain, unspecified      Aug 26, 2016 [facility]  Emergency     Aug 20, 2016 [facility] [facility] [place] [facility]  Emergency      Leg Swelling; Difficulty Breathing      Chest Discomfort; Leg Swelling; Difficulty Breathing      Leg Swelling      Chest Pain      Shortness of Breath      Nausea      Chest pain, unspecified       Aug 05, 2016 [facility] [place] [facility]  Emergency         Recent Inpatient Visit Summary  Admit Date Facility [facility] Type Major Type Diagnoses or Chief Complaint   Aug 26, 2016 [facility]  Inpatient      Chest pain, unspecified      Other long term (current) drug therapy      Gastro-esophageal reflux disease without esophagitis      Personal history of pulmonary embolism      Hyperlipidemia, unspecified      Essential (primary) hypertension      Anemia, unspecified      Presence of coronary angioplasty implant and graft      Body mass index (BMI) 40.0-44.9, adult      Long term (current) use of antithrombotics/antiplatelets      Aug 05, 2016 [facility]  Inpatient  Inpatient      Other seasonal allergic rhinitis      Gout, unspecified  Personal history of pulmonary embolism      Mixed hyperlipidemia      Unspecified urinary incontinence      Presence of coronary angioplasty implant and graft      Dependence on supplemental oxygen      Atherosclerotic heart disease of native coronary artery without angina pectoris      Old myocardial infarction      Gastro-esophageal reflux disease without esophagitis            Prescription Monitoring Program  140??- Narcotic Use Score  070??- Sedative Use Score  000??- Stimulant Use Score  - All Scores range from 000-999 with 75% of the population scoring < 200 and on 1% scoring above 650  - The last digit of the narcotic, sedative, and stimulant score indicates the number of active prescriptions of that type  - Higher Use scores correlate with increased prescribers, pharmacies, mg equiv, and overlapping prescriptions   Concerning or unexpectedly high scores should prompt a review of the PMP record; this does not constitute checking PMP for prescribing purposes.    The above information is provided for the sole purpose of patient treatment. Use of this information beyond the terms of Data Sharing Memorandum  of Understanding and License Agreement is prohibited. In certain cases not all visits may be represented. Consult the aforementioned facilities for additional information.   ? 2018 [employer] - [place], [place] - [email or web address]

## 2016-10-19 NOTE — ED Triage Notes (Signed)
66 y/o family present to the ED with a c/o of right arm pain. Reports right arm burning starting 90 minutes PTA. Pt reports 30 minutes PTA she began having chest pressure, dizziness, blurred vision in the right eye, and finger numbness. Pt reports vomiting x1.

## 2016-10-19 NOTE — ED Notes (Signed)
Cancel code stroke per Dr. Brooke Dare

## 2017-01-04 DIAGNOSIS — R053 Chronic cough: Secondary | ICD-10-CM

## 2017-01-04 DIAGNOSIS — K117 Disturbances of salivary secretion: Secondary | ICD-10-CM

## 2017-01-04 DIAGNOSIS — K649 Unspecified hemorrhoids: Secondary | ICD-10-CM

## 2017-01-04 HISTORY — DX: Chronic cough: R05.3

## 2017-01-04 HISTORY — DX: Disturbances of salivary secretion: K11.7

## 2017-01-04 HISTORY — DX: Unspecified hemorrhoids: K64.9

## 2017-01-13 DIAGNOSIS — Z955 Presence of coronary angioplasty implant and graft: Secondary | ICD-10-CM

## 2017-01-13 HISTORY — DX: Presence of coronary angioplasty implant and graft: Z95.5

## 2017-01-13 HISTORY — PX: CORONARY ANGIOPLASTY WITH STENT PLACEMENT: SHX49

## 2017-02-10 ENCOUNTER — Observation Stay
Admission: EM | Admit: 2017-02-10 | Discharge: 2017-02-11 | Disposition: A | Discharge status: Home / Self Care | Payer: Medicare Other | Attending: Hospitalist | Admitting: Hospitalist

## 2017-02-10 ENCOUNTER — Emergency Department: Payer: Medicare Other

## 2017-02-10 DIAGNOSIS — R0609 Other forms of dyspnea: Secondary | ICD-10-CM | POA: Insufficient documentation

## 2017-02-10 DIAGNOSIS — Z8249 Family history of ischemic heart disease and other diseases of the circulatory system: Secondary | ICD-10-CM | POA: Insufficient documentation

## 2017-02-10 DIAGNOSIS — I252 Old myocardial infarction: Secondary | ICD-10-CM | POA: Insufficient documentation

## 2017-02-10 DIAGNOSIS — E039 Hypothyroidism, unspecified: Secondary | ICD-10-CM | POA: Insufficient documentation

## 2017-02-10 DIAGNOSIS — R079 Chest pain, unspecified: Principal | ICD-10-CM | POA: Insufficient documentation

## 2017-02-10 DIAGNOSIS — Z7902 Long term (current) use of antithrombotics/antiplatelets: Secondary | ICD-10-CM | POA: Insufficient documentation

## 2017-02-10 DIAGNOSIS — I10 Essential (primary) hypertension: Secondary | ICD-10-CM | POA: Insufficient documentation

## 2017-02-10 DIAGNOSIS — D649 Anemia, unspecified: Secondary | ICD-10-CM | POA: Insufficient documentation

## 2017-02-10 DIAGNOSIS — Z955 Presence of coronary angioplasty implant and graft: Secondary | ICD-10-CM | POA: Insufficient documentation

## 2017-02-10 DIAGNOSIS — J302 Other seasonal allergic rhinitis: Secondary | ICD-10-CM | POA: Insufficient documentation

## 2017-02-10 DIAGNOSIS — I25110 Atherosclerotic heart disease of native coronary artery with unstable angina pectoris: Secondary | ICD-10-CM | POA: Insufficient documentation

## 2017-02-10 DIAGNOSIS — Z6841 Body Mass Index (BMI) 40.0 and over, adult: Secondary | ICD-10-CM | POA: Insufficient documentation

## 2017-02-10 DIAGNOSIS — E785 Hyperlipidemia, unspecified: Secondary | ICD-10-CM | POA: Insufficient documentation

## 2017-02-10 DIAGNOSIS — K219 Gastro-esophageal reflux disease without esophagitis: Secondary | ICD-10-CM | POA: Insufficient documentation

## 2017-02-10 DIAGNOSIS — E669 Obesity, unspecified: Secondary | ICD-10-CM | POA: Insufficient documentation

## 2017-02-10 LAB — GFR: EGFR: 55.4

## 2017-02-10 LAB — BASIC METABOLIC PANEL
Anion Gap: 11 (ref 5.0–15.0)
BUN: 24 mg/dL — ABNORMAL HIGH (ref 7–19)
CO2: 22 mEq/L (ref 22–29)
Calcium: 9.5 mg/dL (ref 8.5–10.5)
Chloride: 110 mEq/L (ref 100–111)
Creatinine: 1 mg/dL (ref 0.6–1.0)
Glucose: 94 mg/dL (ref 70–100)
Potassium: 4.1 mEq/L (ref 3.5–5.1)
Sodium: 143 mEq/L (ref 136–145)

## 2017-02-10 LAB — CBC AND DIFFERENTIAL
Absolute NRBC: 0 10*3/uL
Basophils Absolute Automated: 0.02 10*3/uL (ref 0.00–0.20)
Basophils Automated: 0.3 %
Eosinophils Absolute Automated: 0.15 10*3/uL (ref 0.00–0.70)
Eosinophils Automated: 2.2 %
Hematocrit: 36.7 % — ABNORMAL LOW (ref 37.0–47.0)
Hgb: 12.4 g/dL (ref 12.0–16.0)
Immature Granulocytes Absolute: 0.03 10*3/uL
Immature Granulocytes: 0.4 %
Lymphocytes Absolute Automated: 1.96 10*3/uL (ref 0.50–4.40)
Lymphocytes Automated: 28.8 %
MCH: 31.4 pg (ref 28.0–32.0)
MCHC: 33.8 g/dL (ref 32.0–36.0)
MCV: 92.9 fL (ref 80.0–100.0)
MPV: 11 fL (ref 9.4–12.3)
Monocytes Absolute Automated: 0.62 10*3/uL (ref 0.00–1.20)
Monocytes: 9.1 %
Neutrophils Absolute: 4.03 10*3/uL (ref 1.80–8.10)
Neutrophils: 59.2 %
Nucleated RBC: 0 /100 WBC (ref 0.0–1.0)
Platelets: 194 10*3/uL (ref 140–400)
RBC: 3.95 10*6/uL — ABNORMAL LOW (ref 4.20–5.40)
RDW: 13 % (ref 12–15)
WBC: 6.81 10*3/uL (ref 3.50–10.80)

## 2017-02-10 LAB — TROPONIN I: Troponin I: <0.01 ng/mL (ref 0.00–0.09)

## 2017-02-10 LAB — IHS D-DIMER: D-Dimer: 0.37 ug/mL FEU (ref 0.00–0.70)

## 2017-02-10 MED ORDER — MORPHINE SULFATE 4 MG/ML IJ/IV SOLN (WRAP)
4.0000 mg | Freq: Once | Status: AC
Start: 2017-02-10 — End: 2017-02-10
  Administered 2017-02-10: 22:00:00 4 mg via INTRAVENOUS
  Filled 2017-02-10: qty 1

## 2017-02-10 MED ORDER — ONDANSETRON 4 MG PO TBDP
4.00 mg | ORAL_TABLET | Freq: Once | ORAL | Status: AC
Start: 2017-02-10 — End: 2017-02-10
  Administered 2017-02-10: 21:00:00 4 mg via ORAL
  Filled 2017-02-10: qty 1

## 2017-02-10 MED ORDER — NITROGLYCERIN 0.4 MG SL SUBL
0.40 mg | SUBLINGUAL_TABLET | Freq: Once | SUBLINGUAL | Status: AC | PRN
Start: 2017-02-10 — End: 2017-02-10
  Administered 2017-02-10: 23:00:00 0.4 mg via SUBLINGUAL
  Filled 2017-02-10: qty 1

## 2017-02-10 MED ORDER — NITROGLYCERIN 2 % TD OINT
1.00 [in_us] | TOPICAL_OINTMENT | Freq: Once | TRANSDERMAL | Status: AC
Start: 2017-02-10 — End: 2017-02-10
  Administered 2017-02-10: 22:00:00 1 [in_us] via TOPICAL
  Filled 2017-02-10: qty 1

## 2017-02-10 MED ORDER — ASPIRIN 325 MG PO TABS
325.00 mg | ORAL_TABLET | Freq: Once | ORAL | Status: AC
Start: 2017-02-10 — End: 2017-02-10
  Administered 2017-02-10: 22:00:00 325 mg via ORAL
  Filled 2017-02-10: qty 1

## 2017-02-10 NOTE — EDIE (Signed)
[name]?NOTIFICATION?02/10/2017 20:11?Weeks, [name]?MRN: [id number]    This patient has registered at the [facility] Emergency Department   For more information visit: [email or web address]   Criteria met      5 ED Visits in 12 Months    3 Different Facilities in 90 Days    Security Events  No recent Security Events currently on file    ED Care Guidelines  There are currently no ED Care Guidelines in [place] for this patient. Please check your facility's medical records system.    Care Providers  Petrice Beedy has no care providers on record at this time.   E.D. Visit Count (12 mo.)  Facility Visits   [facility] 3   [facility] 4   [facility] 4   [facility] 1   [facility] 1   Total 13   Note: Visits indicate total known visits.      Recent Emergency Department Visit Summary  Admit Date Facility [facility] Type Major Type Diagnoses or Chief Complaint   Feb 10, 2017 [facility] [place] [facility] [place] [facility]  Emergency      Chest Pain      Jan 27, 2017 [facility] [place] [facility]  Emergency     Jan 27, 2017 [facility] [place] [place] [place] [facility]  Emergency     Jan 27, 2017 [facility] [place] [place] [facility]  Emergency      chest pain; shortness of breath      Chest Pain      Chest pain, unspecified      Nov 17, 2016 [facility] [place] [facility]  Emergency         Recent Inpatient Visit Summary  Admit Date Facility [facility] Type Major Type Diagnoses or Chief Complaint   Nov 17, 2016 [facility]  Inpatient      Acute respiratory failure with hypoxia      Essential (primary) hypertension      Personal history of pulmonary embolism      Other long term (current) drug therapy      Hyperlipidemia, unspecified      Obesity, unspecified      Body mass  index (BMI) 45.0-49.9, adult      Long term (current) use of antithrombotics/antiplatelets      Old myocardial infarction      Acute bronchitis, unspecified            Prescription Monitoring Program  070??- Narcotic Use Score  040??- Sedative Use Score  000??- Stimulant Use Score  - All Scores range from 000-999 with 75% of the population scoring < 200 and on 1% scoring above 650  - The last digit of the narcotic, sedative, and stimulant score indicates the number of active prescriptions of that type  - Higher Use scores correlate with increased prescribers, pharmacies, mg equiv, and overlapping prescriptions   Concerning or unexpectedly high scores should prompt a review of the PMP record; this does not constitute checking PMP for prescribing purposes.    The above information is provided for the sole purpose of patient treatment. Use of this information beyond the terms of Data Sharing Memorandum of Understanding and License Agreement is prohibited. In certain cases not all visits may be represented. Consult the aforementioned facilities for additional information.   ?  2019 [employer] - [place], [place] - [email or web address]

## 2017-02-10 NOTE — ED Triage Notes (Signed)
Pt presents with c/o CP x 1.5 hrs and groin pain x 1 month.  Extensive cardiac hx, had a stent placed 1 month ago.  + nausea, reports diaphoresis.  No SOB observed.

## 2017-02-10 NOTE — ED Provider Notes (Signed)
Physician/Midlevel provider first contact with patient: 02/10/17 2033         [facility] EMERGENCY DEPARTMENT HISTORY AND PHYSICAL EXAM    Patient Name: [name], [name]  Encounter Date:  02/10/2017  Rendering Provider: Lucille Passy, MD  Patient DOB:  [birth year]  MRN:  [id number]    History of Presenting Illness     Historian: Patient     67 y.o. female with h/o MI, multiple heart stents (~10) and HTN p/w sudden onset of persistent exertional CP that began at 7:00PM (1 hour and 40 minutes PTA). The patient reports her pain is sharp and exacerbated with exertion and she develops SOB when she walks around. The patient further reports her pain is tight and squeezing while lying flat. At the onset of her sxs, the patient ranked her pain as 8/10 in severity. However, she used nitro spray prior to walking into the ED which provided minimal relief of her pain. The patient reports she experienced similar CP one month ago when she had a heart stent placed at [facility] in [place]. The patient further c/o R groin pain she was attributing to the catheterization site and the sheath that was left in her R groin for one day that occurred one month ago. Denies dysuria, melena, and hematochezia.         PMD:  Kizzie Ide, MD    Past Medical History     Past Medical History:   Diagnosis Date   . Anemia    . Depression    . Disorder of thyroid    . Encounter for blood transfusion    . Gastroesophageal reflux disease    . H/O heart artery stent    . Hyperlipidemia    . Hypertension    . Myocardial infarction    . Seasonal allergic rhinitis        Past Surgical History     Past Surgical History:   Procedure Laterality Date   . ABDOMINAL SURGERY     . BACK SURGERY     . CARDIAC SURGERY      stents placed 06/2008   . HERNIA REPAIR     . HYSTERECTOMY     . JOINT REPLACEMENT     . SPINE SURGERY         Family History     Family History   Problem Relation Age of Onset   . Heart attack Sister    . Heart attack Maternal Aunt    .  Heart attack Paternal Aunt        Social History     Social History     Social History   . Marital status: Divorced     Spouse name: N/A   . Number of children: N/A   . Years of education: N/A     Social History Main Topics   . Smoking status: Never Smoker   . Smokeless tobacco: Never Used   . Alcohol use No   . Drug use: No   . Sexual activity: Not on file     Other Topics Concern   . Not on file     Social History Narrative   . No narrative on file       Home Medications     Home medications reviewed by ED MD     Discharge Medication List as of 02/11/2017  3:22 PM      CONTINUE these medications which have NOT CHANGED    Details  allopurinol (ZYLOPRIM) 300 MG tablet Take 300 mg by mouth nightly.   , Until Discontinued, Historical Med      amLODIPine (NORVASC) 10 MG tablet Take 10 mg by mouth daily., Starting Sun 05/16/2016, Historical Med      ASPIRIN LOW DOSE 81 MG EC tablet Take 81 mg by mouth daily., Starting Sat 05/15/2016, Historical Med      atorvastatin (LIPITOR) 80 MG tablet Take 80 mg by mouth daily., Starting Mon 08/16/2016, Historical Med      citalopram (CELEXA) 40 MG tablet Take 40 mg by mouth daily., Until Discontinued, Historical Med      clopidogrel (PLAVIX) 75 mg tablet Take 75 mg by mouth nightly.   , Until Discontinued, Historical Med      ferrous sulfate 325 (65 FE) MG tablet Take 325 mg by mouth 2 (two) times daily.   , Starting 03/01/2015, Until Discontinued, Historical Med      isosorbide mononitrate (IMDUR) 30 MG 24 hr tablet Take 30 mg by mouth daily., Until Discontinued, Historical Med      levothyroxine (SYNTHROID, LEVOTHROID) 75 MCG tablet Take 75 mcg by mouth Once a day at 6:00am., Until Discontinued, Historical Med      montelukast (SINGULAIR) 10 MG tablet Take 10 mg by mouth nightly., Starting Sun 05/16/2016, Historical Med      pantoprazole (PROTONIX) 40 MG tablet Take 40 mg by mouth daily.    , Historical Med      metoprolol succinate XL (TOPROL-XL) 50 MG 24 hr tablet Take 50 mg by mouth 2  (two) times daily., Historical Med      PROAIR HFA 108 (90 Base) MCG/ACT inhaler 2 puffs every 6 (six) hours as needed.    , Starting Tue 02/18/2015, Historical Med             Review of Systems     Constitutional:  No fever  Eyes: No discharge   ENT: No ST  CV:  +LS CP   Resp:  No SOB or cough  GI: No abd pain, N, V, D  GU: +R groin pain, No dysuria, No melena, No hematochezia  Skin: No rash  Neuro:  No HA  Psych:  No behavior changes  All other systems reviewed and negative    Physical Exam     BP 109/51   Pulse (!) 50   Temp 98 F (36.7 C) (Oral)   Resp 16   Ht 5' (1.524 m)   Wt 104.3 kg   SpO2 95%   BMI 44.92 kg/m     CONSTITUTIONAL   Vital signs reviewed, Obese female in mild pain distress, Alert and oriented X 3.  HEAD   Atraumatic, Normocephalic.  EYES   Eyes are normal to inspection, Pupils equal, round and reactive to light, No discharge from eyes, Extraocular muscles intact, Sclera are normal, Conjunctiva normal.  ENT Mouth normal to Inspection.  NECK   Normal ROM. No jugular venous distention, No meningeal signs.  RESPIRATORY Chest is nontender, Breath sounds normal, No respiratory distress  CARDIOVASCULAR   RRR, No murmurs, Normal S1 S2, Rhythm is normal.  ABDOMEN   Abdomen is nontender, No masses, Bowel sounds normal, No distension, No peritoneal signs. R groin area of concern was evaluated without any sign of erythema and edema. No drainage noted.   BACK  There is no CVA Tenderness, Normal inspection.  UPPER EXTREMITY   Inspection normal, No cyanosis, No edema.  LOWER EXTREMITY   Inspection normal, No cyanosis, No  edema.  NEURO   GCS is 15, Speech normal. 5/5 ms and Nl sensation B. No cerebellar deficits. Nl cranial exam  SKIN Skin is warm, Skin is dry, Skin is normal color.  LYMPHATIC   No adenopathy in neck.  PSYCHIATRIC Oriented X 3, Normal affect.    ED Medications Administered     ED Medication Orders     Start Ordered     Status Ordering Provider    02/10/17 2204 02/10/17 2203   nitroglycerin (NITRO-BID) 2 % ointment 1 inch  Once     Route: Topical  Ordered Dose: 1 inch     Last MAR action:  Canceled Entry Tikita Mabee A    02/10/17 2132 02/10/17 2132  nitroglycerin (NITROSTAT) SL tablet 0.4 mg  once PRN     Route: Sublingual  Ordered Dose: 0.4 mg     Last MAR action:  Given Sabeen Piechocki A    02/10/17 2132 02/10/17 2131  aspirin tablet 325 mg  Once     Route: Oral  Ordered Dose: 325 mg     Last MAR action:  Given Aprill Banko A    02/10/17 2129 02/10/17 2128  morphine injection 4 mg  Once     Route: Intravenous  Ordered Dose: 4 mg     Last MAR action:  Given Yecheskel Kurek A    02/10/17 2107 02/10/17 2106  ondansetron (ZOFRAN-ODT) disintegrating tablet 4 mg  Once     Route: Oral  Ordered Dose: 4 mg     Last MAR action:  Given Shawndale Kilpatrick A          Orders Placed During This Encounter     Orders Placed This Encounter   Procedures   . Chest AP Portable   . Basic Metabolic Panel   . CBC and differential   . Troponin I   . GFR   . D-Dimer   . Troponin I   . Basic Metabolic Panel   . CBC with differential   . GFR   . Lipid panel   . Hemolysis index   . APTT - STAT followed by TIMED   . Cardiac monitoring (ED ONLY)   . ED Unit Sec Comm Order   . ECG 12 lead   . ECG 12 lead   . EKG SCAN   . CARDIAC STRIPS   . Saline lock IV   . Place (admit) for Observation Services   . Einar Gip ED Bed Request (Observation)       Diagnostic Study Results     The results of the diagnostic studies below were reviewed by the ED provider:    Labs  Results     Procedure Component Value Units Date/Time    APTT - STAT followed by TIMED [[id number]] Collected:  02/11/17 0553     Updated:  02/11/17 1021     PTT 32 sec      APTT Anticoag. Given w/i 48 hrs. heparin    Narrative:       When therapeutic range is reached per protocol, change aPTT  frequency to daily at [facility] daily at 0400 until heparin is  discontinued, except for ICU patients - continue q8h aPTTs.    Hemolysis index [[id number]] Collected:  02/11/17 0558      Updated:  02/11/17 0948     Hemolysis Index 9    Lipid panel [[id number]]  (Abnormal) Collected:  02/11/17 0558    Specimen:  Blood Updated:  02/11/17 0865  Cholesterol 120 mg/dL      Triglycerides 161 mg/dL      HDL 28 (L) mg/dL      LDL Calculated 66 mg/dL      VLDL Cholesterol Cal 26 mg/dL      CHOL/HDL Ratio 4.3    Basic Metabolic Panel [[id number]]  (Abnormal) Collected:  02/11/17 0558    Specimen:  Blood Updated:  02/11/17 0647     Glucose 88 mg/dL      BUN 23 (H) mg/dL      Creatinine 0.9 mg/dL      Calcium 9.0 mg/dL      Sodium 811 mEq/L      Potassium 4.0 mEq/L      Chloride 109 mEq/L      CO2 27 mEq/L      Anion Gap 7.0    GFR [[id number]] Collected:  02/11/17 0558     Updated:  02/11/17 0647     EGFR >60.0    Troponin I [[id number]] Collected:  02/11/17 0558    Specimen:  Blood Updated:  02/11/17 0640     Troponin I <0.01 ng/mL     CBC with differential [[id number]]  (Abnormal) Collected:  02/11/17 0558    Specimen:  Blood from Blood Updated:  02/11/17 0617     WBC 5.46 x10 3/uL      Hgb 11.8 (L) g/dL      Hematocrit 41.3 (L) %      Platelets 169 x10 3/uL      RBC 3.82 (L) x10 6/uL      MCV 92.9 fL      MCH 30.9 pg      MCHC 33.2 g/dL      RDW 13 %      MPV 11.1 fL      Neutrophils 46.9 %      Lymphocytes Automated 38.8 %      Monocytes 10.6 %      Eosinophils Automated 2.9 %      Basophils Automated 0.4 %      Immature Granulocyte 0.4 %      Nucleated RBC 0.0 /100 WBC      Neutrophils Absolute 2.56 x10 3/uL      Abs Lymph Automated 2.12 x10 3/uL      Abs Mono Automated 0.58 x10 3/uL      Abs Eos Automated 0.16 x10 3/uL      Absolute Baso Automated 0.02 x10 3/uL      Absolute Immature Granulocyte 0.02 x10 3/uL      Absolute NRBC 0.00 x10 3/uL     Troponin I [[id number]] Collected:  02/11/17 0154    Specimen:  Blood Updated:  02/11/17 0244     Troponin I 0.01 ng/mL     Troponin I [[id number]] Collected:  02/10/17 2050    Specimen:  Blood Updated:  02/10/17 2118     Troponin I <0.01 ng/mL     GFR [[id number]]  Collected:  02/10/17 2049     Updated:  02/10/17 2111     EGFR 55.4    Basic Metabolic Panel [[id number]]  (Abnormal) Collected:  02/10/17 2049    Specimen:  Blood Updated:  02/10/17 2111     Glucose 94 mg/dL      BUN 24 (H) mg/dL      Creatinine 1.0 mg/dL      Calcium 9.5 mg/dL      Sodium 188 mEq/L      Potassium 4.1 mEq/L  Chloride 110 mEq/L      CO2 22 mEq/L      Anion Gap 11.0    D-Dimer [[id number]] Collected:  02/10/17 2049     Updated:  02/10/17 2109     D-Dimer 0.37 ug/mL FEU     CBC and differential [[id number]]  (Abnormal) Collected:  02/10/17 2049    Specimen:  Blood from Blood Updated:  02/10/17 2058     WBC 6.81 x10 3/uL      Hgb 12.4 g/dL      Hematocrit 78.2 (L) %      Platelets 194 x10 3/uL      RBC 3.95 (L) x10 6/uL      MCV 92.9 fL      MCH 31.4 pg      MCHC 33.8 g/dL      RDW 13 %      MPV 11.0 fL      Neutrophils 59.2 %      Lymphocytes Automated 28.8 %      Monocytes 9.1 %      Eosinophils Automated 2.2 %      Basophils Automated 0.3 %      Immature Granulocyte 0.4 %      Nucleated RBC 0.0 /100 WBC      Neutrophils Absolute 4.03 x10 3/uL      Abs Lymph Automated 1.96 x10 3/uL      Abs Mono Automated 0.62 x10 3/uL      Abs Eos Automated 0.15 x10 3/uL      Absolute Baso Automated 0.02 x10 3/uL      Absolute Immature Granulocyte 0.03 x10 3/uL      Absolute NRBC 0.00 x10 3/uL           Radiologic Studies  Radiology Results (24 Hour)     Procedure Component Value Units Date/Time    Chest AP Portable [[id number]] Collected:  02/10/17 2101    Order Status:  Completed Updated:  02/10/17 2105    Narrative:       Reason for exam: 67 year old female with chest pain.    FINDINGS:  Portable AP view. 2049.  The heart size and contour are normal.  Lungs are clear with normal  pulmonary vascularity.  No pleural effusion, hilar or mediastinal  prominence is evident. There is no pneumothorax.      Impression:         No active cardiopulmonary disease.        Wilmon Pali, MD   02/10/2017 9:01 PM                Scribe and MD Attestations     I, Lucille Passy, MD, personally performed the services documented. Gloriajean Dell is scribing for me on [place]. I reviewed and confirm the accuracy of the information in this medical record.    Vaughan Basta, am serving as a scribe to document services personally performed by Lucille Passy, MD, based on the provider's statements to me.     Credentials: Gloriajean Dell, scribe    Rendering Provider: Lucille Passy, MD    Monitors, EKG, Critical Care, and Splints     EKG (interpreted by ED physician): NSR @ 64BPM. PACs. Norm axis. No ST elevations.     Critical Care:   Splint check:      MDM and Clinical Notes     Notes/Consults:    9:26PM Patient still continues to note CP and R groin pain  9:31PM D/w Dr. Mariane Masters, hospitalist, who accepts the patient for admission and requests tele obs.     9:48PM Dr. Rachael Darby, cardiologist, who agrees patient does not need antigcoagulants as she has already recives antiplatele therapy with aspirin. [facility] will contact the patient.      Central Venous Line Procedure Note    Indication for Procedure: IV access    Consent:      Verbal: Yes    Time Out: Patient identified by name and medical record number.  Time Out Performed: Yes    Procedure performed in a fully bundle compliant manner to include:  washed hands prior to procedure  maximal sterile barrier precautions to include sterile gown, mask, sterile gloves, cap, large sterile drape/sheet.      Procedure  Usual sterile prep & drape (as noted above) and chlorhexidine prep of the skin.  20 gage needle      Ultrasound guidance used: No    Location:  L EJ    Line sutured in place: No, covered with tegaderm      Patient tolerated procedure: Well    Complications: None       MDM    On reevaluation after morphine and nitro, she had no chest pain 20 minutes after being seen. Vital signs are stable. She is stable for admission to this hospital.         Diagnosis and Disposition      Clinical Impression  1. Chest pain, unspecified type        Disposition  ED Disposition     ED Disposition Condition Date/Time Comment    Observation  Thu Feb 10, 2017  9:38 PM Admitting Physician: Ron Parker  [[id number]]   Diagnosis: Chest pain [[id number]]   Estimated Length of Stay: < 2 midnights   Tentative Discharge Plan?: Home or Self Care [1]   Patient Class: Observation [104]   Bed request comments: dx CP, med tele obs            Prescriptions       Discharge Medication List as of 02/11/2017  3:22 PM                    Lourdes Sledge, Antony Contras, MD  03/07/17 1332

## 2017-02-10 NOTE — ED Notes (Signed)
Report given to [facility]. Questions answered.

## 2017-02-11 ENCOUNTER — Ambulatory Visit: Payer: Medicare Other

## 2017-02-11 ENCOUNTER — Other Ambulatory Visit: Payer: Medicare Other

## 2017-02-11 ENCOUNTER — Observation Stay: Payer: Medicare Other

## 2017-02-11 ENCOUNTER — Encounter
Admission: RE | Disposition: A | Discharge status: Home / Self Care | Payer: Self-pay | Source: Other Acute Inpatient Hospital | Attending: Cardiovascular Disease

## 2017-02-11 ENCOUNTER — Inpatient Hospital Stay
Admission: RE | Admit: 2017-02-11 | Discharge: 2017-02-17 | DRG: 313 | Disposition: A | Discharge status: Home / Self Care | Payer: Medicare Other | Source: Other Acute Inpatient Hospital | Attending: Internal Medicine | Admitting: Internal Medicine

## 2017-02-11 DIAGNOSIS — K219 Gastro-esophageal reflux disease without esophagitis: Secondary | ICD-10-CM | POA: Diagnosis present

## 2017-02-11 DIAGNOSIS — E785 Hyperlipidemia, unspecified: Secondary | ICD-10-CM | POA: Diagnosis present

## 2017-02-11 DIAGNOSIS — I2510 Atherosclerotic heart disease of native coronary artery without angina pectoris: Secondary | ICD-10-CM | POA: Diagnosis present

## 2017-02-11 DIAGNOSIS — I951 Orthostatic hypotension: Secondary | ICD-10-CM | POA: Diagnosis not present

## 2017-02-11 DIAGNOSIS — R001 Bradycardia, unspecified: Secondary | ICD-10-CM | POA: Diagnosis present

## 2017-02-11 DIAGNOSIS — I200 Unstable angina: Secondary | ICD-10-CM

## 2017-02-11 DIAGNOSIS — Z86711 Personal history of pulmonary embolism: Secondary | ICD-10-CM

## 2017-02-11 DIAGNOSIS — R079 Chest pain, unspecified: Principal | ICD-10-CM | POA: Diagnosis present

## 2017-02-11 DIAGNOSIS — R0902 Hypoxemia: Secondary | ICD-10-CM | POA: Diagnosis present

## 2017-02-11 DIAGNOSIS — Z7982 Long term (current) use of aspirin: Secondary | ICD-10-CM

## 2017-02-11 DIAGNOSIS — Z9981 Dependence on supplemental oxygen: Secondary | ICD-10-CM

## 2017-02-11 DIAGNOSIS — G4733 Obstructive sleep apnea (adult) (pediatric): Secondary | ICD-10-CM | POA: Diagnosis present

## 2017-02-11 DIAGNOSIS — E669 Obesity, unspecified: Secondary | ICD-10-CM | POA: Diagnosis present

## 2017-02-11 DIAGNOSIS — Z7902 Long term (current) use of antithrombotics/antiplatelets: Secondary | ICD-10-CM

## 2017-02-11 DIAGNOSIS — M109 Gout, unspecified: Secondary | ICD-10-CM | POA: Diagnosis present

## 2017-02-11 DIAGNOSIS — G629 Polyneuropathy, unspecified: Secondary | ICD-10-CM | POA: Diagnosis present

## 2017-02-11 DIAGNOSIS — Z955 Presence of coronary angioplasty implant and graft: Secondary | ICD-10-CM

## 2017-02-11 DIAGNOSIS — I252 Old myocardial infarction: Secondary | ICD-10-CM

## 2017-02-11 DIAGNOSIS — Z6841 Body Mass Index (BMI) 40.0 and over, adult: Secondary | ICD-10-CM

## 2017-02-11 DIAGNOSIS — E039 Hypothyroidism, unspecified: Secondary | ICD-10-CM | POA: Diagnosis present

## 2017-02-11 DIAGNOSIS — I10 Essential (primary) hypertension: Secondary | ICD-10-CM | POA: Diagnosis present

## 2017-02-11 LAB — BASIC METABOLIC PANEL
Anion Gap: 7 (ref 5.0–15.0)
BUN: 23 mg/dL — ABNORMAL HIGH (ref 7–19)
CO2: 27 mEq/L (ref 22–29)
Calcium: 9 mg/dL (ref 8.5–10.5)
Chloride: 109 mEq/L (ref 100–111)
Creatinine: 0.9 mg/dL (ref 0.6–1.0)
Glucose: 88 mg/dL (ref 70–100)
Potassium: 4 mEq/L (ref 3.5–5.1)
Sodium: 143 mEq/L (ref 136–145)

## 2017-02-11 LAB — HEMOLYSIS INDEX
Hemolysis Index: 9 (ref 0–18)
Hemolysis Index: 33 — ABNORMAL HIGH (ref 0–18)

## 2017-02-11 LAB — ECG 12-LEAD
Atrial Rate: 65 {beats}/min
Atrial Rate: 59 {beats}/min
P Axis: 67 degrees
P Axis: 53 degrees
P-R Interval: 148 ms
P-R Interval: 162 ms
Q-T Interval: 430 ms
Q-T Interval: 474 ms
QRS Duration: 78 ms
QRS Duration: 80 ms
QTC Calculation (Bezet): 447 ms
QTC Calculation (Bezet): 469 ms
R Axis: 4 degrees
R Axis: 4 degrees
T Axis: 42 degrees
T Axis: 11 degrees
Ventricular Rate: 65 {beats}/min
Ventricular Rate: 59 {beats}/min

## 2017-02-11 LAB — CBC AND DIFFERENTIAL
Absolute NRBC: 0 10*3/uL
Basophils Absolute Automated: 0.02 10*3/uL (ref 0.00–0.20)
Basophils Automated: 0.4 %
Eosinophils Absolute Automated: 0.16 10*3/uL (ref 0.00–0.70)
Eosinophils Automated: 2.9 %
Hematocrit: 35.5 % — ABNORMAL LOW (ref 37.0–47.0)
Hgb: 11.8 g/dL — ABNORMAL LOW (ref 12.0–16.0)
Immature Granulocytes Absolute: 0.02 10*3/uL
Immature Granulocytes: 0.4 %
Lymphocytes Absolute Automated: 2.12 10*3/uL (ref 0.50–4.40)
Lymphocytes Automated: 38.8 %
MCH: 30.9 pg (ref 28.0–32.0)
MCHC: 33.2 g/dL (ref 32.0–36.0)
MCV: 92.9 fL (ref 80.0–100.0)
MPV: 11.1 fL (ref 9.4–12.3)
Monocytes Absolute Automated: 0.58 10*3/uL (ref 0.00–1.20)
Monocytes: 10.6 %
Neutrophils Absolute: 2.56 10*3/uL (ref 1.80–8.10)
Neutrophils: 46.9 %
Nucleated RBC: 0 /100 WBC (ref 0.0–1.0)
Platelets: 169 10*3/uL (ref 140–400)
RBC: 3.82 10*6/uL — ABNORMAL LOW (ref 4.20–5.40)
RDW: 13 % (ref 12–15)
WBC: 5.46 10*3/uL (ref 3.50–10.80)

## 2017-02-11 LAB — COMPREHENSIVE METABOLIC PANEL
ALT: 25 U/L (ref 0–55)
AST (SGOT): 23 U/L (ref 5–34)
Albumin/Globulin Ratio: 1.4 (ref 0.9–2.2)
Albumin: 3.8 g/dL (ref 3.5–5.0)
Alkaline Phosphatase: 71 U/L (ref 37–106)
BUN: 19 mg/dL (ref 7.0–19.0)
Bilirubin, Total: 0.8 mg/dL (ref 0.2–1.2)
CO2: 26 mEq/L (ref 22–29)
Calcium: 8.9 mg/dL (ref 8.5–10.5)
Chloride: 107 mEq/L (ref 100–111)
Creatinine: 0.8 mg/dL (ref 0.6–1.0)
Globulin: 2.8 g/dL (ref 2.0–3.6)
Glucose: 122 mg/dL — ABNORMAL HIGH (ref 70–100)
Potassium: 4 mEq/L (ref 3.5–5.1)
Protein, Total: 6.6 g/dL (ref 6.0–8.3)
Sodium: 141 mEq/L (ref 136–145)

## 2017-02-11 LAB — VITAMIN D,25 OH,TOTAL: Vitamin D, 25 OH, Total: 15 ng/mL — ABNORMAL LOW (ref 30–100)

## 2017-02-11 LAB — LIPID PANEL
Cholesterol / HDL Ratio: 4.3
Cholesterol: 120 mg/dL (ref 0–199)
HDL: 28 mg/dL — ABNORMAL LOW (ref 40–9999)
LDL Calculated: 66 mg/dL (ref 0–99)
Triglycerides: 129 mg/dL (ref 34–149)
VLDL Calculated: 26 mg/dL (ref 10–40)

## 2017-02-11 LAB — URIC ACID: Uric acid: 6.9 mg/dL — ABNORMAL HIGH (ref 2.6–6.0)

## 2017-02-11 LAB — CBC
Absolute NRBC: 0 10*3/uL
Hematocrit: 36.5 % — ABNORMAL LOW (ref 37.0–47.0)
Hgb: 12 g/dL (ref 12.0–16.0)
MCH: 31.3 pg (ref 28.0–32.0)
MCHC: 32.9 g/dL (ref 32.0–36.0)
MCV: 95.3 fL (ref 80.0–100.0)
MPV: 11.3 fL (ref 9.4–12.3)
Nucleated RBC: 0 /100 WBC (ref 0.0–1.0)
Platelets: 163 10*3/uL (ref 140–400)
RBC: 3.83 10*6/uL — ABNORMAL LOW (ref 4.20–5.40)
RDW: 13 % (ref 12–15)
WBC: 5.13 10*3/uL (ref 3.50–10.80)

## 2017-02-11 LAB — IHS D-DIMER: D-Dimer: <0.27 ug/mL FEU (ref 0.00–0.70)

## 2017-02-11 LAB — B-TYPE NATRIURETIC PEPTIDE: B-Natriuretic Peptide: 160 pg/mL — ABNORMAL HIGH (ref 0–100)

## 2017-02-11 LAB — GFR
EGFR: >60
EGFR: >60

## 2017-02-11 LAB — TROPONIN I
Troponin I: 0.01 ng/mL (ref 0.00–0.09)
Troponin I: <0.01 ng/mL (ref 0.00–0.09)

## 2017-02-11 LAB — APTT: PTT: 32 s (ref 23–37)

## 2017-02-11 LAB — VITAMIN B12: Vitamin B-12: 315 pg/mL (ref 211–911)

## 2017-02-11 LAB — TSH: TSH: 1.55 u[IU]/mL (ref 0.35–4.94)

## 2017-02-11 SURGERY — LHC W/ CORONARY ANGIOS AND LV
Anesthesia: Conscious Sedation | Laterality: Left

## 2017-02-11 MED ORDER — ONDANSETRON HCL 4 MG/2ML IJ SOLN
4.00 mg | Freq: Four times a day (QID) | INTRAMUSCULAR | Status: DC | PRN
Start: 2017-02-11 — End: 2017-02-17
  Administered 2017-02-11 – 2017-02-14 (×2): 4 mg via INTRAVENOUS
  Filled 2017-02-11 (×2): qty 2

## 2017-02-11 MED ORDER — HEPARIN SODIUM (PORCINE) 5000 UNIT/ML IJ SOLN
5000.00 [IU] | Freq: Three times a day (TID) | INTRAMUSCULAR | Status: DC
Start: 2017-02-11 — End: 2017-02-17
  Administered 2017-02-11 – 2017-02-17 (×17): 5000 [IU] via SUBCUTANEOUS
  Filled 2017-02-11 (×17): qty 1

## 2017-02-11 MED ORDER — FERROUS SULFATE 324 (65 FE) MG PO TBEC
324.00 mg | DELAYED_RELEASE_TABLET | Freq: Two times a day (BID) | ORAL | Status: DC
Start: 2017-02-11 — End: 2017-02-11

## 2017-02-11 MED ORDER — FERROUS SULFATE 324 (65 FE) MG PO TBEC
324.00 mg | DELAYED_RELEASE_TABLET | Freq: Every day | ORAL | Status: DC
Start: 2017-02-11 — End: 2017-02-17
  Administered 2017-02-11 – 2017-02-17 (×7): 324 mg via ORAL
  Filled 2017-02-11 (×7): qty 1

## 2017-02-11 MED ORDER — ACETAMINOPHEN 650 MG RE SUPP
650.00 mg | RECTAL | Status: DC | PRN
Start: 2017-02-11 — End: 2017-02-17
  Filled 2017-02-11 (×6): qty 1

## 2017-02-11 MED ORDER — FERROUS SULFATE 300 (60 FE) MG/5ML PO SYRP
300.00 mg | ORAL_SOLUTION | Freq: Two times a day (BID) | ORAL | Status: DC
Start: 2017-02-11 — End: 2017-02-11
  Administered 2017-02-11: 09:00:00 300 mg via ORAL
  Filled 2017-02-11 (×3): qty 5

## 2017-02-11 MED ORDER — ONDANSETRON HCL 4 MG/2ML IJ SOLN
4.00 mg | Freq: Once | INTRAMUSCULAR | Status: AC
Start: 2017-02-11 — End: 2017-02-11
  Administered 2017-02-11: 11:00:00 4 mg via INTRAVENOUS
  Filled 2017-02-11: qty 2

## 2017-02-11 MED ORDER — ALLOPURINOL 300 MG PO TABS
300.00 mg | ORAL_TABLET | Freq: Every evening | ORAL | Status: DC
Start: 2017-02-11 — End: 2017-02-11

## 2017-02-11 MED ORDER — HEPARIN (PORCINE) IN D5W 50-5 UNIT/ML-% IV SOLN (UNITS/KG/HR ONLY)
9.59 [IU]/kg/h | INTRAVENOUS | Status: DC
Start: 2017-02-11 — End: 2017-02-11
  Administered 2017-02-11: 11:00:00 9.59 [IU]/kg/h via INTRAVENOUS
  Filled 2017-02-11: qty 500

## 2017-02-11 MED ORDER — ISOSORBIDE MONONITRATE ER 30 MG PO TB24
30.00 mg | ORAL_TABLET | Freq: Every day | ORAL | Status: DC
Start: 2017-02-11 — End: 2017-02-17
  Administered 2017-02-11 – 2017-02-17 (×7): 30 mg via ORAL
  Filled 2017-02-11 (×7): qty 1

## 2017-02-11 MED ORDER — MORPHINE SULFATE 2 MG/ML IJ/IV SOLN (WRAP)
2.0000 mg | Freq: Once | Status: AC
Start: 2017-02-11 — End: 2017-02-11
  Administered 2017-02-11: 14:00:00 2 mg via INTRAVENOUS
  Filled 2017-02-11: qty 1

## 2017-02-11 MED ORDER — METOPROLOL SUCCINATE ER 50 MG PO TB24
50.00 mg | ORAL_TABLET | Freq: Two times a day (BID) | ORAL | Status: DC
Start: 2017-02-11 — End: 2017-02-13
  Administered 2017-02-12: 12:00:00 50 mg via ORAL
  Filled 2017-02-11 (×3): qty 1

## 2017-02-11 MED ORDER — MONTELUKAST SODIUM 10 MG PO TABS
10.00 mg | ORAL_TABLET | Freq: Every evening | ORAL | Status: DC
Start: 2017-02-11 — End: 2017-02-11
  Administered 2017-02-11: 02:00:00 10 mg via ORAL
  Filled 2017-02-11: qty 1

## 2017-02-11 MED ORDER — ONDANSETRON HCL 4 MG/2ML IJ SOLN
4.00 mg | Freq: Three times a day (TID) | INTRAMUSCULAR | Status: DC | PRN
Start: 2017-02-11 — End: 2017-02-11
  Administered 2017-02-11: 01:00:00 4 mg via INTRAVENOUS
  Filled 2017-02-11: qty 2

## 2017-02-11 MED ORDER — ISOSORBIDE MONONITRATE ER 30 MG PO TB24
30.00 mg | ORAL_TABLET | Freq: Every day | ORAL | Status: DC
Start: 2017-02-11 — End: 2017-02-11
  Administered 2017-02-11: 09:00:00 30 mg via ORAL
  Filled 2017-02-11: qty 1

## 2017-02-11 MED ORDER — PANTOPRAZOLE SODIUM 40 MG PO TBEC
40.00 mg | DELAYED_RELEASE_TABLET | Freq: Every morning | ORAL | Status: DC
Start: 2017-02-12 — End: 2017-02-17
  Administered 2017-02-12 – 2017-02-17 (×6): 40 mg via ORAL
  Filled 2017-02-11 (×6): qty 1

## 2017-02-11 MED ORDER — MONTELUKAST SODIUM 10 MG PO TABS
10.00 mg | ORAL_TABLET | Freq: Every evening | ORAL | Status: DC
Start: 2017-02-12 — End: 2017-02-17
  Administered 2017-02-12 – 2017-02-16 (×5): 10 mg via ORAL
  Filled 2017-02-11 (×5): qty 1

## 2017-02-11 MED ORDER — MORPHINE SULFATE 2 MG/ML IJ/IV SOLN (WRAP)
2.0000 mg | Freq: Once | Status: AC
Start: 2017-02-11 — End: 2017-02-11
  Administered 2017-02-11: 04:00:00 2 mg via INTRAVENOUS
  Filled 2017-02-11: qty 1

## 2017-02-11 MED ORDER — CITALOPRAM HYDROBROMIDE 20 MG PO TABS
40.00 mg | ORAL_TABLET | Freq: Every day | ORAL | Status: DC
Start: 2017-02-11 — End: 2017-02-11
  Administered 2017-02-11: 09:00:00 40 mg via ORAL
  Filled 2017-02-11: qty 2

## 2017-02-11 MED ORDER — PANTOPRAZOLE SODIUM 40 MG PO TBEC
40.00 mg | DELAYED_RELEASE_TABLET | Freq: Every morning | ORAL | Status: DC
Start: 2017-02-11 — End: 2017-02-11
  Administered 2017-02-11: 09:00:00 40 mg via ORAL
  Filled 2017-02-11: qty 1

## 2017-02-11 MED ORDER — NALOXONE HCL 0.4 MG/ML IJ SOLN (WRAP)
0.20 mg | INTRAMUSCULAR | Status: DC | PRN
Start: 2017-02-11 — End: 2017-02-17

## 2017-02-11 MED ORDER — LEVOTHYROXINE SODIUM 75 MCG PO TABS
75.00 ug | ORAL_TABLET | Freq: Every day | ORAL | Status: DC
Start: 2017-02-11 — End: 2017-02-11
  Administered 2017-02-11: 05:00:00 75 ug via ORAL
  Filled 2017-02-11: qty 1

## 2017-02-11 MED ORDER — MORPHINE SULFATE 2 MG/ML IJ/IV SOLN (WRAP)
2.0000 mg | Freq: Once | Status: AC
Start: 2017-02-11 — End: 2017-02-11
  Administered 2017-02-11: 11:00:00 2 mg via INTRAVENOUS
  Filled 2017-02-11: qty 1

## 2017-02-11 MED ORDER — AMLODIPINE BESYLATE 5 MG PO TABS
10.00 mg | ORAL_TABLET | Freq: Every day | ORAL | Status: DC
Start: 2017-02-11 — End: 2017-02-11
  Administered 2017-02-11: 09:00:00 10 mg via ORAL
  Filled 2017-02-11: qty 2

## 2017-02-11 MED ORDER — METOPROLOL SUCCINATE ER 50 MG PO TB24
50.00 mg | ORAL_TABLET | Freq: Two times a day (BID) | ORAL | Status: DC
Start: 2017-02-11 — End: 2017-02-11
  Administered 2017-02-11: 09:00:00 50 mg via ORAL
  Filled 2017-02-11: qty 1

## 2017-02-11 MED ORDER — HEPARIN SODIUM (PORCINE) 5000 UNIT/ML IJ SOLN
3000.00 [IU] | INTRAMUSCULAR | Status: DC | PRN
Start: 2017-02-11 — End: 2017-02-11

## 2017-02-11 MED ORDER — ASPIRIN 81 MG PO TBEC
81.00 mg | DELAYED_RELEASE_TABLET | Freq: Every day | ORAL | Status: DC
Start: 2017-02-12 — End: 2017-02-17
  Administered 2017-02-12 – 2017-02-17 (×6): 81 mg via ORAL
  Filled 2017-02-11 (×6): qty 1

## 2017-02-11 MED ORDER — CLOPIDOGREL BISULFATE 75 MG PO TABS
75.00 mg | ORAL_TABLET | Freq: Every evening | ORAL | Status: DC
Start: 2017-02-11 — End: 2017-02-17
  Administered 2017-02-11 – 2017-02-16 (×6): 75 mg via ORAL
  Filled 2017-02-11 (×6): qty 1

## 2017-02-11 MED ORDER — HEPARIN SODIUM (PORCINE) 5000 UNIT/ML IJ SOLN
4000.00 [IU] | Freq: Once | INTRAMUSCULAR | Status: AC
Start: 2017-02-11 — End: 2017-02-11
  Administered 2017-02-11: 11:00:00 4000 [IU] via INTRAVENOUS
  Filled 2017-02-11: qty 1

## 2017-02-11 MED ORDER — AMLODIPINE BESYLATE 5 MG PO TABS
10.00 mg | ORAL_TABLET | Freq: Every day | ORAL | Status: DC
Start: 2017-02-12 — End: 2017-02-17
  Administered 2017-02-12 – 2017-02-17 (×6): 10 mg via ORAL
  Filled 2017-02-11 (×6): qty 2

## 2017-02-11 MED ORDER — CLOPIDOGREL BISULFATE 75 MG PO TABS
75.00 mg | ORAL_TABLET | Freq: Every evening | ORAL | Status: DC
Start: 2017-02-11 — End: 2017-02-11
  Administered 2017-02-11: 02:00:00 75 mg via ORAL
  Filled 2017-02-11: qty 1

## 2017-02-11 MED ORDER — MORPHINE SULFATE 2 MG/ML IJ/IV SOLN (WRAP)
2.0000 mg | Status: AC | PRN
Start: 2017-02-11 — End: 2017-02-13
  Administered 2017-02-11 – 2017-02-13 (×7): 2 mg via INTRAVENOUS
  Filled 2017-02-11 (×7): qty 1

## 2017-02-11 MED ORDER — ASPIRIN 81 MG PO TBEC
81.00 mg | DELAYED_RELEASE_TABLET | Freq: Every day | ORAL | Status: DC
Start: 2017-02-11 — End: 2017-02-11
  Administered 2017-02-11: 09:00:00 81 mg via ORAL
  Filled 2017-02-11: qty 1

## 2017-02-11 MED ORDER — ALLOPURINOL 300 MG PO TABS
300.00 mg | ORAL_TABLET | Freq: Every evening | ORAL | Status: DC
Start: 2017-02-11 — End: 2017-02-17
  Administered 2017-02-11 – 2017-02-16 (×6): 300 mg via ORAL
  Filled 2017-02-11 (×2): qty 1
  Filled 2017-02-11: qty 3
  Filled 2017-02-11 (×3): qty 1

## 2017-02-11 MED ORDER — ONDANSETRON HCL 4 MG/2ML IJ SOLN
4.00 mg | Freq: Once | INTRAMUSCULAR | Status: AC
Start: 2017-02-11 — End: 2017-02-11
  Administered 2017-02-11: 14:00:00 4 mg via INTRAVENOUS
  Filled 2017-02-11: qty 2

## 2017-02-11 MED ORDER — ATORVASTATIN CALCIUM 80 MG PO TABS
80.00 mg | ORAL_TABLET | Freq: Every day | ORAL | Status: DC
Start: 2017-02-11 — End: 2017-02-17
  Administered 2017-02-11 – 2017-02-17 (×7): 80 mg via ORAL
  Filled 2017-02-11 (×2): qty 1
  Filled 2017-02-11: qty 2
  Filled 2017-02-11: qty 1
  Filled 2017-02-11: qty 2
  Filled 2017-02-11 (×2): qty 1

## 2017-02-11 MED ORDER — MORPHINE SULFATE 4 MG/ML IJ/IV SOLN (WRAP)
3.0000 mg | Freq: Once | Status: AC
Start: 2017-02-11 — End: 2017-02-11
  Administered 2017-02-11: 01:00:00 3 mg via INTRAVENOUS
  Filled 2017-02-11: qty 1

## 2017-02-11 MED ORDER — LEVOTHYROXINE SODIUM 25 MCG PO TABS
75.00 ug | ORAL_TABLET | Freq: Every day | ORAL | Status: DC
Start: 2017-02-12 — End: 2017-02-17
  Administered 2017-02-12 – 2017-02-17 (×6): 75 ug via ORAL
  Filled 2017-02-11: qty 3
  Filled 2017-02-11: qty 1
  Filled 2017-02-11: qty 3
  Filled 2017-02-11 (×3): qty 1
  Filled 2017-02-11 (×3): qty 3

## 2017-02-11 MED ORDER — ONDANSETRON 4 MG PO TBDP
4.00 mg | ORAL_TABLET | Freq: Four times a day (QID) | ORAL | Status: DC | PRN
Start: 2017-02-11 — End: 2017-02-17
  Administered 2017-02-12: 4 mg via ORAL
  Filled 2017-02-11 (×5): qty 1

## 2017-02-11 MED ORDER — ATORVASTATIN CALCIUM 20 MG PO TABS
80.00 mg | ORAL_TABLET | Freq: Every evening | ORAL | Status: DC
Start: 2017-02-11 — End: 2017-02-11
  Administered 2017-02-11: 02:00:00 80 mg via ORAL
  Filled 2017-02-11: qty 4

## 2017-02-11 MED ORDER — SODIUM CHLORIDE 0.9 % IV SOLN
INTRAVENOUS | Status: DC
Start: 2017-02-11 — End: 2017-02-11

## 2017-02-11 MED ORDER — CITALOPRAM HYDROBROMIDE 20 MG PO TABS
40.00 mg | ORAL_TABLET | Freq: Every day | ORAL | Status: DC
Start: 2017-02-11 — End: 2017-02-17
  Administered 2017-02-11 – 2017-02-17 (×7): 40 mg via ORAL
  Filled 2017-02-11 (×8): qty 2

## 2017-02-11 MED ORDER — ACETAMINOPHEN 325 MG PO TABS
650.00 mg | ORAL_TABLET | ORAL | Status: DC | PRN
Start: 2017-02-11 — End: 2017-02-17
  Administered 2017-02-12 – 2017-02-17 (×13): 650 mg via ORAL
  Filled 2017-02-11 (×13): qty 2

## 2017-02-11 MED ORDER — POLYETHYLENE GLYCOL 3350 17 G PO PACK
17.00 g | PACK | Freq: Every day | ORAL | Status: DC | PRN
Start: 2017-02-11 — End: 2017-02-17
  Administered 2017-02-16: 17 g via ORAL
  Filled 2017-02-11 (×2): qty 1

## 2017-02-11 MED ORDER — NALOXONE HCL 0.4 MG/ML IJ SOLN (WRAP)
0.20 mg | INTRAMUSCULAR | Status: DC | PRN
Start: 2017-02-11 — End: 2017-02-11

## 2017-02-11 MED ORDER — TRAZODONE HCL 50 MG PO TABS
50.00 mg | ORAL_TABLET | Freq: Every evening | ORAL | Status: DC | PRN
Start: 2017-02-11 — End: 2017-02-17
  Filled 2017-02-11: qty 1

## 2017-02-11 NOTE — Consults (Signed)
[facility] CARDIOVASCULAR CONSULTATION REPORT      Date Time: 02/11/17 5:35 PM  Patient Name: [name]  Requesting Physician: Lamar Benes, MD     Reason for Consultation:   Chest pain  History:   [name] is a 67 y.o. female admitted on 02/11/2017, for whom I am asked to provide cardiac consultation by Lamar Benes, MD. For chest pain    Problem List   1. Chest pain   2. NSTEMI (non-ST elevated myocardial infarction)   3. Presence of coronary angioplasty implant and graft   4. CAD (coronary artery disease)   5. HTN (hypertension)   6. Hx of myocardial infarction   7. Hx of pulmonary embolus   8. Abnormal nuclear stress test   9. Dyslipidemia   10. Allergy to iodine    Cardiac cath 07/13/16:  Hemodynamics:   Central aortic pressure:113/60/82 mmHg.    LV: 137/2/12 mmHg.      3 Vessel CAD.  Left main: Normal.  LAD: Prox stent: patent. Mid stent Patient.   LCX: Prox 40-50% plaque. Left to right collaterals  RCA: Proximal stent Patent. Mid RCA stent: Occluded. DIstal RCA: 60% stentosis.  Mid RCA new de novo 90% stenosis     LVgram: Mild Inferobasal hypokinesis. EF 60%    Successful IVUS guided PTCA and DES to Mid Stent occlusion. Onyx resolute 2.7x22 mm  Successful IVUS Guided PTCA DES to Mid RCA de novo lesion reducing 90% stenosis to 0% residual. Onyx resolute 2.5x 155 mm   Sucessful PTCA to Distal RCA reducing 60% stenosis to 10% residual. 2.5x 12 mm Emerge balloon.     2 D Echo Jun 22 2016  1. Left ventricle: The left ventricle size is normal. Wall     thickness is normal. Systolic function is normal. The visually     estimated ejection fraction is 55-60%. Mild hypokinesis of the     inferolateral myocardium. Doppler parameters are consistent with     abnormal left ventricular relaxation (grade 1 diastolic     dysfunction).  2. Aortic valve: Mild sclerosis.    Lexiscan nuclear stress test May 15ht 2018       IMPRESSION:         1. Reversible ischemia in the inferior wall.       2. Left ventricular  ejection fraction is 64%.    Cardiac cath 01/13/17 [facility] [place],  Prox LCX 70-80% stenosis.   DES 3.0x 20 mm Synergy DES  RCA: discrete In stent Restenosis FFR 0.94  Mid LAD Patent Stent, Prox to stent 50% stenosis.     HPI:  [name] is a 67 yr old woman well known to me with above Problem List who was seen in my office 2 weeks ago after undergoing Coronary DES in LCX artery in [facility], [place] and who started having R groin and RLQ abdominal pain over the past few days. Her Cardiac cath was done through R femoral access with sheath being left in during transfer to hospital to have her PCI done there. The R groin pain is severe and exquisite. She also complains of chest pain in the retrosternal area that is ongoing since yest evening. The patient was admitted to [facility] overnight. She ruled out for MI by serial Trop. ECG demonstrated no acute ST-T changes.   The patient was transferred to [facility] for further mgmt.   Upon arrival the patient cont to complain of R groin pain. She states her CP  is still there. ECG: Sinus Bradycardia. No ST-T changes.    Assessment:     Patient Active Problem List   Diagnosis   . Chest pain possibly costochondritis    . Coronary artery disease   . HTN (hypertension)   . HLD (hyperlipidemia)   . Gout   . Nephrolithiasis   . History of pulmonary embolism   . Hypothyroid   . Obesity   . Depression   . GERD (gastroesophageal reflux disease)   . Anemia   . OSA (obstructive sleep apnea)     1. Chest pain  2. R Groin pain.    3. Presence of coronary angioplasty implant and graft   4. CAD (coronary artery disease)   5. HTN (hypertension)   6. Hx of myocardial infarction   7. Hx of pulmonary embolus   8. Abnormal nuclear stress test   9. Dyslipidemia   10. Allergy to iodine.    Patient's chest pain is unlikely due to cardiac etiology as it persists with no increase in Troponin and no ECG evidence of acute ST-T changes.   I am concerned her R groin pain could be due to R femoral  artery pseudoaneurysm. Thus will do R femoral artery duplex US.    [place] heparin. Cont dual antiplatelet therapy.    Cont Metoprolol, Amlodipine, and Imdur.  Cont High dose Atrovastatin.   Tentative [place] in AM if Duplex US negative.     Recommendations:   1.R groin Duplex US rule out R femoral artery aneurysm.   2. [place] Heparin.  3. Cont baby ASA daily.  4. Cont Plavix 75 mg daily.   5. Cont Metoprolol.   6. Cont Amlodipine.   7. Cont high dose Atorvastatin.   8. Tentative  in AM.     Physical Exam:   Patient Vitals for the past 12 hrs:   BP Temp Pulse Resp   02/11/17 1633 103/54 98.4 F (36.9 C) (!) 51 18    103 kg (227 lb) 1.524 m (5')   Temp (24hrs), Avg:98.1 F (36.7 C), Min:97.8 F (36.6 C), Max:98.4 F (36.9 C)    Intake and Output Summary (Last 24 hours) at Date Time  No intake or output data in the 24 hours ending 02/11/17 1735    GENERAL: Patient is in no acute distress   HEENT: No scleral icterus or conjunctival pallor, moist mucous membranes   NECK: No jugular venous distention or thyromegaly, normal carotid upstrokes without bruits   CARDIAC: S1 & S2 regular, no murmurs or gallops   CHEST: Clear to auscultation bilaterally, normal respiratory effort  ABDOMEN:  Nontender, non-distended, good bowel sounds, no masses, or hepatosplenomegaly, No abdominal bruits. RLQ Tender.   EXTREMITIES: R groin tender to palpation. No clubbing, cyanosis, edema   SKIN: No rash or jaundice   NEUROLOGIC: Alert and oriented to time, place and person, normal mood and affect MUSCULOSKELETAL: Normal muscle strength and tone.    Past Medical History:     Past Medical History:   Diagnosis Date   . Anemia    . Depression    . Disorder of thyroid    . Encounter for blood transfusion    . Gastroesophageal reflux disease    . H/O heart artery stent    . Hyperlipidemia    . Hypertension    . Myocardial infarction    . Seasonal allergic rhinitis      Past Surgical History:     Past Surgical History:  Procedure Laterality Date   .  ABDOMINAL SURGERY     . BACK SURGERY     . CARDIAC SURGERY      stents placed 06/2008   . HERNIA REPAIR     . HYSTERECTOMY     . JOINT REPLACEMENT     . SPINE SURGERY       Family History:     Family History   Problem Relation Age of Onset   . Heart attack Sister    . Heart attack Maternal Aunt    . Heart attack Paternal Aunt      Social History:     Social History     Social History   . Marital status: Divorced     Spouse name: N/A   . Number of children: N/A   . Years of education: N/A     Social History Main Topics   . Smoking status: Never Smoker   . Smokeless tobacco: Never Used   . Alcohol use No   . Drug use: No   . Sexual activity: Not on file     Other Topics Concern   . Not on file     Social History Narrative   . No narrative on file     Allergies:     Allergies   Allergen Reactions   . Iodine Swelling   . Red Dye    . Walnuts [Tree Nuts] Swelling   . Zoster Vaccine Live Swelling     Medications:     Prescriptions Prior to Admission   Medication Sig   . allopurinol (ZYLOPRIM) 300 MG tablet Take 300 mg by mouth nightly.      Marland Kitchen amLODIPine (NORVASC) 10 MG tablet Take 10 mg by mouth daily.   . ASPIRIN LOW DOSE 81 MG EC tablet Take 81 mg by mouth daily.   Marland Kitchen atorvastatin (LIPITOR) 80 MG tablet Take 80 mg by mouth daily.   . citalopram (CELEXA) 40 MG tablet Take 40 mg by mouth daily.   . clopidogrel (PLAVIX) 75 mg tablet Take 75 mg by mouth nightly.      . ferrous sulfate 325 (65 FE) MG tablet Take 325 mg by mouth 2 (two) times daily.      . isosorbide mononitrate (IMDUR) 30 MG 24 hr tablet Take 30 mg by mouth daily.   Marland Kitchen levothyroxine (SYNTHROID, LEVOTHROID) 75 MCG tablet Take 75 mcg by mouth Once a day at 6:00am.   . metoprolol succinate XL (TOPROL-XL) 50 MG 24 hr tablet Take 50 mg by mouth 2 (two) times daily.   . montelukast (SINGULAIR) 10 MG tablet Take 10 mg by mouth nightly.   . pantoprazole (PROTONIX) 40 MG tablet Take 40 mg by mouth daily.       Marland Kitchen PROAIR HFA 108 (90 Base) MCG/ACT inhaler 2 puffs every 6  (six) hours as needed.          Review of Systems:   Comprehensive review of systems including constitutional, eyes, ears, nose, mouth, throat, cardiovascular, GI, GU, musculoskeletal, integumentary, respiratory, neurologic, psychiatric, and endocrine is negative other than what is mentioned already in the history of present illness.    Labs Reviewed:   Recent CMP   Recent Labs      02/11/17   0558   Glucose  88   BUN  23*   Creatinine  0.9   Sodium  143   Potassium  4.0   CO2  27   Calcium  9.0  Recent CBC WITH DIFF Recent Labs      02/11/17   0558   RBC  3.82*   Hgb  11.8*   Hematocrit  35.5*   MCV  92.9   MCHC  33.2   RDW  13   MPV  11.1   Platelets  169     Radiology:   Radiological Procedure reviewed.    chest X-ray      Signed by: Bethann Humble, FSCAI  02/11/2017, 5:35 PM  [facility]  [phone]

## 2017-02-11 NOTE — Plan of Care (Addendum)
Major Shift Events:   Pain managed with PRN morphine     Review of Systems  Neuro:  A&Ox4. Intact. See flowsheet for details     Cardiac:  NSR - ST   Good pulses, no edema     Respiratory:  RA. Clear lung sounds    GI/GU:  Continent. Walk to bathroom     Skin:  Scattered Bruising L arm    BM this shift? N      Problem: Safety  Goal: Patient will be free from injury during hospitalization  Outcome: Progressing   02/11/17 2330   Goal/Interventions addressed this shift   Patient will be free from injury during hospitalization  Provide alternative method of communication if needed [employer], writing);Assess for patients risk for elopement and implement Elopement Risk Plan per policy;Include patient/ family/ care giver in decisions related to safety;Ensure appropriate safety devices are available at the bedside;Provide and maintain safe environment;Assess patient's risk for falls and implement fall prevention plan of care per policy;Use appropriate transfer methods;Hourly rounding     Goal: Patient will be free from infection during hospitalization  Outcome: Progressing   02/11/17 2330   Goal/Interventions addressed this shift   Free from Infection during hospitalization Monitor all insertion sites (i.e. indwelling lines, tubes, urinary catheters, and drains);Monitor lab/diagnostic results;Assess and monitor for signs and symptoms of infection       Problem: Pain  Goal: Pain at adequate level as identified by patient  Outcome: Progressing   02/11/17 2330   Goal/Interventions addressed this shift   Pain at adequate level as identified by patient Identify patient comfort function goal;Assess pain on admission, during daily assessment and/or before any "as needed" intervention(s);Evaluate if patient comfort function goal is met;Reassess pain within 30-60 minutes of any procedure/intervention, per Pain Assessment, Intervention, Reassessment (AIR) Cycle;Assess for risk of opioid induced respiratory  depression, including snoring/sleep apnea. Alert healthcare team of risk factors identified.;Evaluate patient's satisfaction with pain management progress;Consult/collaborate with Pain Service;Offer non-pharmacological pain management interventions;Include patient/patient care companion in decisions related to pain management as needed       Problem: Side Effects from Pain Analgesia  Goal: Patient will experience minimal side effects of analgesic therapy  Outcome: Progressing   02/11/17 2330   Goal/Interventions addressed this shift   Patient will experience minimal side effects of analgesic therapy Evaluate for opioid-induced sedation with appropriate assessment tool (i.e. POSS);Prevent/manage side effects per LIP orders (i.e. nausea, vomiting, pruritus, constipation, urinary retention, etc.);Assess for changes in cognitive function;Monitor/assess patient's respiratory status (RR depth, effort, breath sounds)       Problem: Discharge Barriers  Goal: Patient will be discharged home or other facility with appropriate resources  Outcome: Progressing   02/11/17 2330   Goal/Interventions addressed this shift   Discharge to home or other facility with appropriate resources Provide appropriate patient education;Initiate discharge planning;Provide information on available health resources       Problem: Psychosocial and Spiritual Needs  Goal: Demonstrates ability to cope with hospitalization/illness  Outcome: Progressing   02/11/17 2330   Goal/Interventions addressed this shift   Demonstrates ability to cope with hospitalizations/illness Communicate referral to spiritual care as appropriate;Include patient/ patient care companion in decisions;Reinforce positive adaptation of new coping behaviors;Encourage patient to set small goals for self;Assist patient to identify own strengths and abilities;Encourage verbalization of feelings/concerns/expectations;Provide quiet environment;Encourage participation in diversional  activity       Problem: Moderate/High Fall Risk Score >5  Goal: Patient will remain free of falls  Outcome: Progressing  02/11/17 2030   OTHER   Moderate Risk (6-13) MOD-Remain with patient during toileting   High (Greater than 13) MOD-Place Fall Risk level on whiteboard in room

## 2017-02-11 NOTE — Discharge Summary (Signed)
[facility]      Patient: [name]  Admission Date: 02/10/2017   DOB: November 05, 1950  Discharge Date: 02/11/2017    MRN: [id number]  Discharge Attending: Katy Apo MD   Referring Physician: Kizzie Ide, MD  PCP: Kizzie Ide, MD       DISCHARGE SUMMARY     Discharge Information   Admission Diagnosis:   Chest pain,Unstable angina     Discharge Diagnosis:   Patient Active Problem List    Diagnosis Date Noted   . Chest pain possibly costochondritis  10/07/2012   . Coronary artery disease 10/07/2012   . HTN (hypertension) 10/07/2012   . HLD (hyperlipidemia) 10/07/2012   . Gout 10/07/2012   . Nephrolithiasis 10/07/2012   . History of pulmonary embolism 10/07/2012   . Hypothyroid 10/07/2012   . Obesity 10/07/2012   . Depression 10/07/2012   . GERD (gastroesophageal reflux disease) 10/07/2012   . Anemia 10/07/2012   . OSA (obstructive sleep apnea) 10/07/2012      Admission Condition: serious  Discharge Condition: stable  Functional Status: Patient will be independent with mobility/ambulation, transfers, ADL's, IADL's.    Discharge Medications:     Medication List      ASK your doctor about these medications    allopurinol 300 MG tablet  Commonly known as:  ZYLOPRIM     amLODIPine 10 MG tablet  Commonly known as:  NORVASC     ASPIRIN LOW DOSE 81 MG EC tablet  Generic drug:  aspirin     atorvastatin 80 MG tablet  Commonly known as:  LIPITOR     citalopram 40 MG tablet  Commonly known as:  CeleXA     clopidogrel 75 mg tablet  Commonly known as:  PLAVIX     ferrous sulfate 325 (65 FE) MG tablet     isosorbide mononitrate 30 MG 24 hr tablet  Commonly known as:  IMDUR     levothyroxine 75 MCG tablet  Commonly known as:  SYNTHROID, LEVOTHROID     metoprolol succinate XL 50 MG 24 hr tablet  Commonly known as:  TOPROL-XL     montelukast 10 MG tablet  Commonly known as:  SINGULAIR     pantoprazole 40 MG tablet  Commonly known as:  PROTONIX     PROAIR HFA 108 (90 Base) MCG/ACT inhaler  Generic drug:   albuterol                Hospital Course   Presentation History   [name] is a 67 y.o. female with a PMHx of HTN, HLD, MI, CAD s/p 10 cardiac stents with most recent stent one month to unknown vessel in [place]  who presented with chest pain.     She reports she was spending time with her grand daughter at home this evening and was watching her try on clothes when she developed 7/10 chest pressure radiating to left arm and jaw associated with nausea. She reports pain has been constant since for several hours decreasing to a 1/10 intensity with nitro and 4/10 with IV morphine. She states she drove herself to ER and gave herself two nitro sprays before walking into the hospital . She also received a nitro patch and aspirin in the ED. She reports she is compliant with Plavix and ASA and does not miss any doses. She reports CP is worse with exertion and improved with rest.     See HPI for details.    Hospital Course (  0 Days)     1. Unstable angina in a patient with extensive CAD s/p PCI and multiple stents,most recent one done at [facility] in [place] 4 weeks ago  Patient continues to have recurrent,intermittent,squeezing typ chest pain since yesterday,with pain going down to her left arm with exertional dyspnea and nausea with some diaphoresis on an off all night.  D/W Dr. Candis Sh[facility] heart),will transfer to [facility] cath lab asap for Cardiac cath today.  Will initiate Heparin gtt(low intensity) per ACS protocol.  Patient's primary cardiologist,Dr Abou-Ghazala will be the interventional radiologist at [facility] and is also the accepting physician at [facility]    2. Right groin pain at the site of Right femoral artery cath site 67 month ago   Right femoral artery [place] has been ordered to rule out femoral pseudoaneursym (PSA),fistula.  The pending Right femoral [place] should not hold up discharge to [facility] today.Right femoral [place] can be done at [facility] too.    D/W Dr. Particia Jasper ([facility]) and Dr.  Ollen Bowl    Procedures/Imaging:   Chest AP Portable   Final Result      No active cardiopulmonary disease.            Wilmon Pali, MD    02/10/2017 9:01 PM      US Arterial / Graft Duplex Doppler Lower Extremity Right    (Results Pending)       Treatment Team:   Attending Provider: Katy Apo, MD  Consulting Physician: Ron Parker, MD  Consulting Physician: Laretta Alstrom, MD       Best Practices   Was the patient admitted with either a CHF Exacerbation or Pneumonia? No     Progress Note/Physical Exam at Discharge     Subjective: Awake and alert,oriented x 4,has intermittent sub-sternal chest discomfort.    Vitals:    02/11/17 0533 02/11/17 0637 02/11/17 0759 02/11/17 0901   BP: 127/59 161/71 124/60 124/60   Pulse: 60  65 66   Resp: 19  18    Temp: 97.8 F (36.6 C)  97.9 F (36.6 C)    TempSrc: Oral  Oral    SpO2: 95%  95%    Weight:       Height:           General: NAD, AAOx3  HEENT: perrla, eomi, sclera anicteric, OP: Clear, MMM  Neck: supple, FROM, no LAD  Cardiovascular: RRR, no m/r/g  Lungs: CTAB, no w/r/r  Abdomen: soft, +BS, NT/ND, no masses, no g/r  Extremities: Right femoral area,tender to touch,no palpable hematoma,no obvious ecchymosis,no C/C/E,capillary refill normal,no clubbing or cyanosis noted.  Skin: no rashes or lesions noted  Neuro: CN 2-12 intact; No Focal neurological deficits       Diagnostics     Labs/Studies Pending at Discharge: No    Last Labs     Recent Labs  Lab 02/11/17  0558 02/10/17  2049   WBC 5.46 6.81   RBC 3.82* 3.95*   Hgb 11.8* 12.4   Hematocrit 35.5* 36.7*   MCV 92.9 92.9   Platelets 169 194         Recent Labs  Lab 02/11/17  0558 02/10/17  2049   Sodium 143 143   Potassium 4.0 4.1   Chloride 109 110   CO2 27 22   BUN 23* 24*   Creatinine 0.9 1.0   Glucose 88 94   Calcium 9.0 9.5       Microbiology Results     None  Patient Instructions   Discharge Diet: regular diet and NPO for now  Discharge Activity:  bedrest    Follow Up Appointment:  Follow-up  Information     Kizzie Ide, MD .    Specialty:  Family Medicine  Contact information:  [phone]  [address]  [place] [address]  [phone]                    Time spent examining patient, discussing with patient/family regarding hospital course, chart review, reconciling medications and discharge planning: 45 minutes.    Signed,  Katy Apo, MD  9:35 AM 02/11/2017

## 2017-02-11 NOTE — H&P (Signed)
[facility]      Patient: [name]  Date: 02/10/2017   DOB: [birth year]  Admission Date: 02/10/2017   MRN: [id number]  Attending: Randall An MD      History Gathered From: self    HISTORY AND PHYSICAL     [name] is a 67 y.o. female with a PMHx of HTN, HLD, MI, CAD s/p 10 cardiac stents with most recent stent one month to unknown vessel in [place]  who presented with chest pain.     She reports she was spending time with her grand daughter at home this evening and was watching her try on clothes when she developed 7/10 chest pressure radiating to left arm and jaw associated with nausea. She reports pain has been constant since for several hours decreasing to a 1/10 intensity with nitro and 4/10 with IV morphine. She states she drove herself to ER and gave herself two nitro sprays before walking into the hospital . She also received a nitro patch and aspirin in the ED. She reports she is compliant with Plavix and ASA and does not miss any doses. She reports CP is worse with exertion and improved with rest.       Past Medical History:   Diagnosis Date   . Anemia    . Depression    . Disorder of thyroid    . Encounter for blood transfusion    . Gastroesophageal reflux disease    . H/O heart artery stent    . Hyperlipidemia    . Hypertension    . Myocardial infarction    . Seasonal allergic rhinitis        Past Surgical History:   Procedure Laterality Date   . ABDOMINAL SURGERY     . BACK SURGERY     . CARDIAC SURGERY      stents placed 06/2008   . HERNIA REPAIR     . HYSTERECTOMY     . JOINT REPLACEMENT     . SPINE SURGERY         Prior to Admission medications    Medication Sig Start Date End Date Taking? Authorizing Provider   amLODIPine (NORVASC) 10 MG tablet Take 10 mg by mouth daily. 05/16/16  Yes [provider]   ASPIRIN LOW DOSE 81 MG EC tablet Take 81 mg by mouth daily. 05/15/16  Yes [provider]   atorvastatin (LIPITOR) 80 MG tablet Take 80 mg by mouth daily.  08/16/16  Yes [provider]   citalopram (CELEXA) 40 MG tablet Take 40 mg by mouth daily.   Yes [provider]   clopidogrel (PLAVIX) 75 mg tablet Take 75 mg by mouth nightly.      Yes [provider]   ferrous sulfate 325 (65 FE) MG tablet Take 325 mg by mouth 2 (two) times daily.    03/01/15  Yes [provider]   isosorbide mononitrate (IMDUR) 30 MG 24 hr tablet Take 30 mg by mouth daily.   Yes [provider]   levothyroxine (SYNTHROID, LEVOTHROID) 75 MCG tablet Take 75 mcg by mouth Once a day at 6:00am.   Yes [provider]   metoprolol succinate XL (TOPROL-XL) 50 MG 24 hr tablet Take 50 mg by mouth 2 (two) times daily.   Yes [provider]   montelukast (SINGULAIR) 10 MG tablet Take 10 mg by mouth nightly. 05/16/16  Yes [provider]   pantoprazole (PROTONIX) 40 MG tablet Take 40  mg by mouth daily.       Yes [provider]   allopurinol (ZYLOPRIM) 300 MG tablet Take 300 mg by mouth nightly.       [provider]   PROAIR HFA 108 (90 Base) MCG/ACT inhaler 2 puffs every 6 (six) hours as needed.     02/18/15   [provider]       Allergies   Allergen Reactions   . Iodine Swelling   . Red Dye    . Walnuts [Tree Nuts] Swelling   . Zoster Vaccine Live Swelling       CODE STATUS: full code    PRIMARY CARE MD: Kizzie Ide, MD    Family History   Problem Relation Age of Onset   . Heart attack Sister    . Heart attack Maternal Aunt    . Heart attack Paternal Aunt        Social History   Substance Use Topics   . Smoking status: Never Smoker   . Smokeless tobacco: Never Used   . Alcohol use No       REVIEW OF SYSTEMS     Ten point review of systems negative or as per HPI and below endorsements.    PHYSICAL EXAM     Vital Signs (most recent): BP 145/65   Pulse (!) 59   Temp 98 F (36.7 C)   Resp 18   Ht 1.524 m (5')   Wt 104.3 kg (230 lb)   SpO2 97%   BMI 44.92 kg/m   Constitutional: No apparent distress.   Patient speaks freely in full sentences.   HEENT: NC/AT, PERRL, no scleral icterus or conjunctival pallor, no nasal discharge, MMM, oropharynx without erythema or exudate  Neck: trachea midline, supple, no cervical or supraclavicular lymphadenopathy or masses  Cardiovascular: RRR, normal S1 S2, no murmurs, gallops, palpable thrills, no JVD, Non-displaced PMI.  Respiratory: Normal rate. No retractions or increased work of breathing. Clear to auscultation and percussion bilaterally.  Gastrointestinal: +BS, non-distended, soft, non-tender, no rebound or guarding, no hepatosplenomegaly  Genitourinary: no suprapubic or costovertebral angle tenderness  Musculoskeletal: ROM and motor strength grossly normal. No clubbing, edema, or cyanosis. DP and radial pulses 2+ and symmetric.  Skin: no rashes, jaundice or other lesions  Neurologic: EOMI, CN 2-12 grossly intact. no gross motor or sensory deficits, patellar and bicep DTR 2+ and symmetric, downward plantar reflexes  Psychiatric: AAOx3, affect and mood appropriate. The patient is alert, interactive, appropriate.    LABS & IMAGING     Recent Results (from the past 24 hour(s))   Basic Metabolic Panel    Collection Time: 02/10/17  8:49 PM   Result Value Ref Range    Glucose 94 70 - 100 mg/dL    BUN 24 (H) 7 - 19 mg/dL    Creatinine 1.0 0.6 - 1.0 mg/dL    Calcium 9.5 8.5 - 16.1 mg/dL    Sodium 096 045 - 409 mEq/L    Potassium 4.1 3.5 - 5.1 mEq/L    Chloride 110 100 - 111 mEq/L    CO2 22 22 - 29 mEq/L    Anion Gap 11.0 5.0 - 15.0   CBC and differential    Collection Time: 02/10/17  8:49 PM   Result Value Ref Range    WBC 6.81 3.50 - 10.80 x10 3/uL    Hgb 12.4 12.0 - 16.0 g/dL    Hematocrit 81.1 (L) 37.0 - 47.0 %    Platelets  194 140 - 400 x10 3/uL    RBC 3.95 (L) 4.20 - 5.40 x10 6/uL    MCV 92.9 80.0 - 100.0 fL    MCH 31.4 28.0 - 32.0 pg    MCHC 33.8 32.0 - 36.0 g/dL    RDW 13 12 - 15 %    MPV 11.0 9.4 - 12.3 fL    Neutrophils 59.2 None %    Lymphocytes Automated 28.8 None %     Monocytes 9.1 None %    Eosinophils Automated 2.2 None %    Basophils Automated 0.3 None %    Immature Granulocyte 0.4 None %    Nucleated RBC 0.0 0.0 - 1.0 /100 WBC    Neutrophils Absolute 4.03 1.80 - 8.10 x10 3/uL    Abs Lymph Automated 1.96 0.50 - 4.40 x10 3/uL    Abs Mono Automated 0.62 0.00 - 1.20 x10 3/uL    Abs Eos Automated 0.15 0.00 - 0.70 x10 3/uL    Absolute Baso Automated 0.02 0.00 - 0.20 x10 3/uL    Absolute Immature Granulocyte 0.03 0 x10 3/uL    Absolute NRBC 0.00 0 x10 3/uL   GFR    Collection Time: 02/10/17  8:49 PM   Result Value Ref Range    EGFR 55.4    D-Dimer    Collection Time: 02/10/17  8:49 PM   Result Value Ref Range    D-Dimer 0.37 0.00 - 0.70 ug/mL FEU   Troponin I    Collection Time: 02/10/17  8:50 PM   Result Value Ref Range    Troponin I <0.01 0.00 - 0.09 ng/mL           IMAGING (images personally reviewed and concur with radiologist unless otherwise stated below):  Chest AP Portable   Final Result      No active cardiopulmonary disease.            Wilmon Pali, MD    02/10/2017 9:01 PM          CARDIAC:  EKG Interpretation (on my review 3):  NSR     Markers:    Recent Labs  Lab 02/10/17  2050   Troponin I <0.01       EMERGENCY DEPARTMENT COURSE:  Orders Placed This Encounter   Procedures   . Chest AP Portable   . Basic Metabolic Panel   . CBC and differential   . Troponin I   . GFR   . D-Dimer   . Troponin I   . Basic Metabolic Panel   . CBC with differential   . GFR   . Diet NPO effective now Except for: SIPS WITH MEDS   . Cardiac monitoring (ED ONLY)   . ED Holding Orders Expire in 8 Hours   . Notify Admitting Attending ( Change in Condition)   . Notify Attending of Patient Arrival to Floor within 8 Hours   . Notify Physician (Vital Signs)   . Notify Physician (Lab Results)   . Vital Signs Q4HR   . Pulse Oximetry   . Progressive Mobility Protocol   . Notify physician   . I/O   . Height   . Weight   . Skin assessment   . Place sequential compression device   . Maintain sequential  compression device   . Education: Activity   . Education: Disease Process & Condition   . Education: Pain Management   . Education: Falls Risk   . Education: Smoking Cessation   .  Telemetry 24 Hour Protocol   . Full Code   . ED Unit Sec Comm Order   . ECG 12 lead   . ECG 12 lead   . EKG SCAN   . Saline lock IV   . Place (admit) for Observation Services   . Einar Gip ED Bed Request (Observation)       ASSESSMENT & PLAN     [name] is a 67 y.o. female admitted under OBSERVATION with CP. I started evaluating the patient at 10 pm on 02/11/2016    1. Typical Chest Pain:    Extensive history of CAD , s/p 10 stents in lifetime, recent stent 1 month ago to unknown vessel at [facility] in [place]. Compliant with Plavix & ASA    EKG normal.   Trend Trops X 3    ED paged [facility] on call- no AC recommended. Await trops. If trop + will order dose of Lovenox.    Cont Imdur and BB    NPO, consult patients cardiologist in AM Dr. Ollen Bowl     2. HTN:    Cont home meds     3. Depression:    Cont home med     4. GERD:    PPI     5. Hypothyroidism:    Cont Synthroid     Safety Checklist  DVT prophylaxis: Chemical   Foley: Not present   IVs:  Peripheral IV     PT/OT: Ordered   Daily CBC & or Chem ordered: Yes, due to clinical and lab instability       Patient Lines/Drains/Airways Status    Active PICC Line / CVC Line / PIV Line / Drain / Airway / Intraosseous Line / Epidural Line / ART Line / Line / Wound / Pressure Ulcer / NG/OG Tube     Name:   Placement date:   Placement time:   Site:   Days:    Peripheral IV 02/10/17 Left External Jugular  02/10/17    2147    External Jugular    less than 1                Anticipated medical stability for discharge: January,4 - Afternoon    SignedRandall An MD  02/11/2017 1:48 AM

## 2017-02-11 NOTE — Plan of Care (Signed)
Problem: Safety  Goal: Patient will be free from injury during hospitalization  Outcome: Progressing   02/11/17 0259   Goal/Interventions addressed this shift   Patient will be free from injury during hospitalization  Assess patient's risk for falls and implement fall prevention plan of care per policy;Use appropriate transfer methods;Provide and maintain safe environment;Ensure appropriate safety devices are available at the bedside;Include patient/ family/ care giver in decisions related to safety;Hourly rounding;Assess for patients risk for elopement and implement Elopement Risk Plan per policy;Provide alternative method of communication if needed (communication boards, writing)       Problem: Pain  Goal: Pain at adequate level as identified by patient  Outcome: Progressing   02/11/17 0259   Goal/Interventions addressed this shift   Pain at adequate level as identified by patient Identify patient comfort function goal;Assess for risk of opioid induced respiratory depression, including snoring/sleep apnea. Alert healthcare team of risk factors identified.;Assess pain on admission, during daily assessment and/or before any "as needed" intervention(s);Evaluate patient's satisfaction with pain management progress;Reassess pain within 30-60 minutes of any procedure/intervention, per Pain Assessment, Intervention, Reassessment (AIR) Cycle;Evaluate if patient comfort function goal is met;Offer non-pharmacological pain management interventions;Include patient/patient care companion in decisions related to pain management as needed

## 2017-02-11 NOTE — H&P (Signed)
[facility], [facility]  Answering service [phone]  Admission History and Physical Examination      Date Time: 02/11/17 6:22 PM  Patient Name: [name]  Attending Physician: Lamar Benes, MD  Primary Care Physician: Kizzie Ide, MD    CC: Chest pain      History of Presenting Illness:   Kenetha Cozza is a 67 y.o. female with PMH of MI, PE, GERD, CAD with stents, HLD, MI, hypothyroidism was transferred from [facility] to [facility] on 02/11/17 for further evaluation of chest pain and possible cardiac cath. Associated symptoms include R groin pain from stent incision site. Patient complains of chest pain, SOB with exsersion. Denies palpitations, fever, chills, recent illness.     Past Medical History:     Past Medical History:   Diagnosis Date   . Anemia    . Depression    . Disorder of thyroid    . Encounter for blood transfusion    . Gastroesophageal reflux disease    . H/O heart artery stent    . Hyperlipidemia    . Hypertension    . Myocardial infarction    . Seasonal allergic rhinitis      Available old records reviewed,    Past Surgical History:     Past Surgical History:   Procedure Laterality Date   . ABDOMINAL SURGERY     . BACK SURGERY     . CARDIAC SURGERY      stents placed 06/2008   . HERNIA REPAIR     . HYSTERECTOMY     . JOINT REPLACEMENT     . SPINE SURGERY         Family History:     Family History   Problem Relation Age of Onset   . Heart attack Sister    . Heart attack Maternal Aunt    . Heart attack Paternal Aunt      Reviewed    Social History:     Social History     Social History   . Marital status: Divorced     Spouse name: N/A   . Number of children: N/A   . Years of education: N/A     Social History Main Topics   . Smoking status: Never Smoker   . Smokeless tobacco: Never Used   . Alcohol use No   . Drug use: No   . Sexual activity: Not on file     Other Topics Concern   . Not on file     Social History Narrative   . No narrative on file     Reviewed    Allergies:      Allergies   Allergen Reactions   . Iodine Swelling   . Red Dye    . Walnuts [Tree Nuts] Swelling   . Zoster Vaccine Live Swelling       Medications:     Current Discharge Medication List      CONTINUE these medications which have NOT CHANGED    Details   allopurinol (ZYLOPRIM) 300 MG tablet Take 300 mg by mouth nightly.         amLODIPine (NORVASC) 10 MG tablet Take 10 mg by mouth daily.  Refills: 1      ASPIRIN LOW DOSE 81 MG EC tablet Take 81 mg by mouth daily.  Refills: 3      atorvastatin (LIPITOR) 80 MG tablet Take 80 mg by mouth daily.      citalopram (CELEXA)  40 MG tablet Take 40 mg by mouth daily.      clopidogrel (PLAVIX) 75 mg tablet Take 75 mg by mouth nightly.         ferrous sulfate 325 (65 FE) MG tablet Take 325 mg by mouth 2 (two) times daily.         isosorbide mononitrate (IMDUR) 30 MG 24 hr tablet Take 30 mg by mouth daily.      levothyroxine (SYNTHROID, LEVOTHROID) 75 MCG tablet Take 75 mcg by mouth Once a day at 6:00am.      metoprolol succinate XL (TOPROL-XL) 50 MG 24 hr tablet Take 50 mg by mouth 2 (two) times daily.      montelukast (SINGULAIR) 10 MG tablet Take 10 mg by mouth nightly.  Refills: 3      pantoprazole (PROTONIX) 40 MG tablet Take 40 mg by mouth daily.          PROAIR HFA 108 (90 Base) MCG/ACT inhaler 2 puffs every 6 (six) hours as needed.                  Review of Systems:   No headache. No Eyes, Ear, Nose, Throat problem. No chest pain, orthopnea or shortness or breath or cough, no abdominal pain, nausea or vomiting. No pain or weakness in upper or lower extremity, no psychiatric, neurological, endocrine or hematological complaints other than above.    Physical Exam:     Vitals:    02/11/17 1633   BP: 103/54   Pulse: (!) 51   Resp: 18   Temp: 98.4 F (36.9 C)   SpO2: 93%       Intake and Output Summary (Last 24 hours) at Date Time  No intake or output data in the 24 hours ending 02/11/17 1822    General: Awake,  no acute distress.  HEENT: Normocephalic, No icter or  conjunctival injection  Neck: supple, no lymphadenopathy, no JVD, no carotid bruits  Cardiovascular: S1, S2 regular rhythm and rate, no murmurs, rubs or gallops  Lungs: Bilateral breath sounds, clear to auscultation bilaterally, without wheezing, rhonchi, or rales  Abdomen: soft, non-tender, non-distended; no palpable masses, no hepatosplenomegaly, normoactive bowel sounds, no rebound or guarding  Extremities: no clubbing, cyanosis, or edema  Neuro: Exam grossly Non focal,   Skin: no rashes or lesions noted      Labs:   Labs were personally reviewed    Results     ** No results found for the last 24 hours. **          Radiology Results (24 Hour)     Procedure Component Value Units Date/Time    [place] Groin Pseudoaneurysm Right W Dopp [[id number]] Collected:  02/11/17 1811    Order Status:  Completed Updated:  02/11/17 1816    Narrative:       CLINICAL HISTORY: Recent catheterization, concern for subtle aneurysm    COMPARISON: None    TECHNIQUE: Targeted sonography the right groin was performed.    FINDINGS:    No pseudoaneurysm is identified. There is no significant hematoma.  Normal arterial and venous waveforms within the right common femoral  vessels.      Impression:         1. No evidence of pseudoaneurysm or AV fistula.    Launa Flight, MD   02/11/2017 6:12 PM             Imaging personally reviewed,   ECG, tele personally reviewed,  Assessment/ Plan     Patient Active Problem List   Diagnosis   . Chest pain possibly costochondritis    . Coronary artery disease   . HTN (hypertension)   . HLD (hyperlipidemia)   . Gout   . Nephrolithiasis   . History of pulmonary embolism   . Hypothyroid   . Obesity   . Depression   . GERD (gastroesophageal reflux disease)   . Anemia   . OSA (obstructive sleep apnea)     Chest pain, transferred form [facility] for possible cardiac cath, cards following, Chest X-ray w/o acute finding     Groin pain, Right groin pain at the access site for stent, [place] with no evidence of pseudoaneurysm  or AV fistula, Morphine for pain ordered    HTN, controlled, on BB, Norvasc    H/O MI: on BB, Plavix and ASA, cath on 01/13/17, recent abnormal stress test    CAD, stable on ASA    H/O PE, d-dimer ordered, VQ scan ordered, allergies to contrast, no AC     Hypothyroidism: Synthroid , TSH ordered     Depression; on Celexa    HLD, on Lipitor, recent lipid panel in EPIC    Gout, on Allopurinol    OSA: cont nocturnal 02 [place]    GI PPX/ GERD: Protonix    DVT PPX: Heparin SQ    Dispo: Possible in AM               Signed by: Hurley Cisco, NP  cc:Kizzie Ide, MD    [facility] contact:    [facility]yson Babinski [facility]:  [phone]      [facility]:    Tower NP (M-F 8am-5pm) [phone]  [facility] MD (M-F 8am-5pm)  [phone]    [facility] NP (M-F 8am-5pm) [phone]  [facility] MD (M-F 8am-5pm) [phone]    24 hour answering service M-F 5pm-8am, Saturday and Sunday and if no answer on above numbers, [phone], please request attending physician covering patient that day    *This note was generated by the Epic EMR system/ Dragon speech recognition and may contain inherent errors or omissions not intended by the user. Grammatical errors, random word insertions, deletions, pronoun errors and incomplete sentences are occasional consequences of this technology due to software limitations. Not all errors are caught or corrected. If there are questions or concerns about the content of this note or information contained within the body of this dictation they should be addressed directly with the author for clarification.*

## 2017-02-11 NOTE — Plan of Care (Signed)
Transfer report given to RN Lucy at [facility] in [facility]. Morphine & zofran given for persistent chest pain and nausea upon transfer. On heparin gtt @9 .59 units/kg/hr on transfer. Afebrile. Vss. NSR. No sob or respiratory distress upon transfer.

## 2017-02-11 NOTE — Consults (Addendum)
[place] [facility] C[facility]IOLOGY CONSULTATION REPORT  Tyson Babinski [facility]    Date Time: 02/11/17 8:21 AMPatient Name: [name]  Requesting Physician: Katy Apo, MD       Reason for Consultation:   Chest pain      History:   Arleatha Philipps is a 67 y.o. female admitted on 02/10/2017.  We have been asked by Katy Apo, MD,  to provide cardiac consultation, regarding chest pain.  Cardiac hx as follows:    1.  IMI 2010 with multiple stents to RCA  2.  Staged intervention to LAD and diagonal shortly thereafter  3.  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  Multiple presentations with chest pain thereafter - numerous MPI studies and caths which were unrevealing  5.  LAD stent 2016 in [place]  6.  We saw her here for chest pain 03/2015:  Lexiscan MPI was normal then.  7.  However 2 additional stents at [facility] 07/2016 with Dr. Irene Shipper - her usual cardiologist  8.  Then one additional stent at [facility] in [place] 4 weeks ago  9.  CAD risks:  Fhx, htn, hld, obese, osa, post-menopause    Now:  Worsening pain at right femoral artery cath site from last month. Severe now.  Also last night 7 pm, recurrent chest pain. Left chest into left arm. Squeezing. Off and on.  Sometimes superimposed sharp sensation.  Worsens with exertion and dyspnea accompanies that. Nausea. Some diaphoresis.  Off and on all night.      Past Medical History:     Past Medical History:   Diagnosis Date   . Anemia    . Depression    . Disorder of thyroid    . Encounter for blood transfusion    . Gastroesophageal reflux disease    . H/O heart artery stent    . Hyperlipidemia    . Hypertension    . Myocardial infarction    . Seasonal allergic rhinitis        Past Surgical History:     Past Surgical History:   Procedure Laterality Date   . ABDOMINAL SURGERY     . BACK SURGERY     . CARDIAC SURGERY      stents placed 06/2008   . HERNIA REPAIR     . HYSTERECTOMY     . JOINT REPLACEMENT     . SPINE SURGERY         Family History:      Family History   Problem Relation Age of Onset   . Heart attack Sister    . Heart attack Maternal Aunt    . Heart attack Paternal Aunt        Social History:     Social History     Social History   . Marital status: Divorced     Spouse name: N/A   . Number of children: N/A   . Years of education: N/A     Social History Main Topics   . Smoking status: Never Smoker   . Smokeless tobacco: Never Used   . Alcohol use No   . Drug use: No   . Sexual activity: Not on file     Other Topics Concern   . Not on file     Social History Narrative   . No narrative on file       Allergies:     Allergies   Allergen Reactions   .  Iodine Swelling   . Red Dye    . Walnuts [Tree Nuts] Swelling   . Zoster Vaccine Live Swelling       Medications:     Prescriptions Prior to Admission   Medication Sig   . amLODIPine (NORVASC) 10 MG tablet Take 10 mg by mouth daily.   . ASPIRIN LOW DOSE 81 MG EC tablet Take 81 mg by mouth daily.   Marland Kitchen atorvastatin (LIPITOR) 80 MG tablet Take 80 mg by mouth daily.   . citalopram (CELEXA) 40 MG tablet Take 40 mg by mouth daily.   . clopidogrel (PLAVIX) 75 mg tablet Take 75 mg by mouth nightly.      . ferrous sulfate 325 (65 FE) MG tablet Take 325 mg by mouth 2 (two) times daily.      . isosorbide mononitrate (IMDUR) 30 MG 24 hr tablet Take 30 mg by mouth daily.   Marland Kitchen levothyroxine (SYNTHROID, LEVOTHROID) 75 MCG tablet Take 75 mcg by mouth Once a day at 6:00am.   . metoprolol succinate XL (TOPROL-XL) 50 MG 24 hr tablet Take 50 mg by mouth 2 (two) times daily.   . montelukast (SINGULAIR) 10 MG tablet Take 10 mg by mouth nightly.   . pantoprazole (PROTONIX) 40 MG tablet Take 40 mg by mouth daily.       Marland Kitchen allopurinol (ZYLOPRIM) 300 MG tablet Take 300 mg by mouth nightly.      Marland Kitchen PROAIR HFA 108 (90 Base) MCG/ACT inhaler 2 puffs every 6 (six) hours as needed.           Current Facility-Administered Medications   Medication Dose Route Frequency Provider Last Rate Last Dose   . allopurinol (ZYLOPRIM) tablet 300 mg  300  mg Oral QHS Doylene Bode, MD       . amLODIPine (NORVASC) tablet 10 mg  10 mg Oral Daily Doylene Bode, MD       . aspirin EC tablet 81 mg  81 mg Oral Daily Doylene Bode, MD       . atorvastatin (LIPITOR) tablet 80 mg  80 mg Oral QHS Doylene Bode, MD   80 mg at 02/11/17 0146   . citalopram (CeleXA) tablet 40 mg  40 mg Oral Daily Doylene Bode, MD       . clopidogrel (PLAVIX) tablet 75 mg  75 mg Oral QHS Doylene Bode, MD   75 mg at 02/11/17 0146   . ferrous sulfate 300 (60 Fe) MG/5ML syrup 300 mg  300 mg Oral BID Meals Doylene Bode, MD       . isosorbide mononitrate (IMDUR) 24 hr tablet 30 mg  30 mg Oral Daily Doylene Bode, MD       . levothyroxine (SYNTHROID, LEVOTHROID) tablet 75 mcg  75 mcg Oral Daily at 0600 Doylene Bode, MD   75 mcg at 02/11/17 1478   . metoprolol succinate XL (TOPROL-XL) 24 hr tablet 50 mg  50 mg Oral Q12H SCH Doylene Bode, MD       . montelukast (SINGULAIR) tablet 10 mg  10 mg Oral QHS Doylene Bode, MD   10 mg at 02/11/17 0146   . naloxone [facility]) injection 0.2 mg  0.2 mg Intravenous PRN Doylene Bode, MD       . ondansetron [facility]) injection 4 mg  4 mg Intravenous Q8H PRN Ernestine Conrad, PA   4 mg at 02/11/17 0100   . pantoprazole (PROTONIX) EC tablet 40  mg  40 mg Oral QAM AC Doylene Bode, MD             Review of Systems:    Comprehensive review of systems including constitutional, eyes, ears, nose, mouth, throat, cardiovascular, GI, GU, musculoskeletal, integumentary, respiratory, neurologic, psychiatric, and endocrine is negative other than what is mentioned already in the history of present illness    Physical Exam:     Vitals:    02/11/17 0759   BP: 124/60   Pulse: 65   Resp: 18   Temp: 97.9 F (36.6 C)   SpO2: 95%     Temp (24hrs), Avg:98 F (36.7 C), Min:97.8 F (36.6 C), Max:98.4 F (36.9 C)      Intake and Output Summary (Last 24 hours) at Date Time  No intake or output data in the 24 hours ending 02/11/17 0821    GENERAL:  Patient is uncomfortable mainly from right groin pain. Moderately obese.  HEENT: No scleral icterus or conjunctival pallor, moist mucous membranes   NECK: No jugular venous distention or thyromegaly, normal carotid upstrokes without bruits   CARDIAC: Normal apical impulse, regular rate and rhythm, with normal S1 and S2, and soft s4  CHEST: Clear to auscultation bilaterally, normal respiratory effort  ABDOMEN: No abdominal bruits, masses, or hepatosplenomegaly, nontender, non-distended, good bowel sound. Obese.   EXTREMITIES: Right femoral area very tender to touch. No ecchymoses. No hematoma palpable.  2+ pulse.  Intact distal pulse.  Otherwise:  No clubbing, cyanosis, or edema, 2+ DP, PT, and femoral pulses bilaterally without bruits  SKIN: No rash or jaundice   NEUROLOGIC: Alert and oriented to time, place and person, normal mood and affect  MUSCULOSKELETAL: Normal muscle strength and tone.      Labs Reviewed:       Recent Labs  Lab 02/11/17  0558 02/11/17  0154 02/10/17  2050   Troponin I <0.01 0.01 <0.01                           Recent Labs  Lab 02/11/17  0558 02/10/17  2049   WBC 5.46 6.81   Hgb 11.8* 12.4   Hematocrit 35.5* 36.7*   Platelets 169 194       Recent Labs  Lab 02/11/17  0558 02/10/17  2049   Sodium 143 143   Potassium 4.0 4.1   Chloride 109 110   CO2 27 22   BUN 23* 24*   Creatinine 0.9 1.0   EGFR >60.0 55.4   Glucose 88 94   Calcium 9.0 9.5     Ekg; normal    Radiology   Radiological Procedure reviewed.      chest X-ray  Assessment:    Chest pain - must be presumed ACS in setting of extensive CAD hx and recurrent presentations and multiple percutaneous interventions, the last ov which was only a month ago.   Negative troponin and ekg though   Right femoral cath site pain.  Vascular exam unrevealing.   Past hx details:    1.  IMI 2010 with multiple stents to RCA  2.  Staged intervention to LAD and diagonal shortly thereafter  3.  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   Multiple presentations with chest pain thereafter - numerous MPI studies and caths which were unrevealing  5.  LAD stent 2016 in [place]  6.  We saw her here for chest pain  03/2015:  Lexiscan MPI was normal then.  7.  However 2 additional stents at [facility] 07/2016 with Dr. Irene Shipper - her usual cardiologist  8.  Then one additional stent at [facility] in [place] 4 weeks ago  9.  CAD risks:  Fhx, htn, hld, obese, osa, post-menopause    Recommendations:    Right femoral artery ultrasound - r/p PSA, fistula etc   She merits cath and possible PCI.  I will reach out to Dr. Irene Shipper, her usual cardiologist and the interventionalist who did her last local PCI, as for continuity it may be best for him to assume care and do the procedure - if possible.  Continue DAPT, multi-drug  And current Anti-anginal regiment interim.            Signed by: Wynona Canes, MD    [facility]  NP [employer] [phone] (8am-5pm)  MD [employer] [phone] (8am-5pm)  After hours, non urgent consult line [phone]  After Hours, urgent consults [phone]      ADDENDUM:  I spoke with Dr. Thurnell Garbe, and he wishes prompt transfer to [facility], to do do cath today.  I will ask Dr. Salley Scarlet to assist with logistics.

## 2017-02-12 ENCOUNTER — Observation Stay: Payer: Medicare Other

## 2017-02-12 LAB — CBC
Absolute NRBC: 0 10*3/uL
Hematocrit: 35.5 % — ABNORMAL LOW (ref 37.0–47.0)
Hgb: 11.6 g/dL — ABNORMAL LOW (ref 12.0–16.0)
MCH: 31.5 pg (ref 28.0–32.0)
MCHC: 32.7 g/dL (ref 32.0–36.0)
MCV: 96.5 fL (ref 80.0–100.0)
MPV: 11.4 fL (ref 9.4–12.3)
Nucleated RBC: 0 /100 WBC (ref 0.0–1.0)
Platelets: 170 10*3/uL (ref 140–400)
RBC: 3.68 10*6/uL — ABNORMAL LOW (ref 4.20–5.40)
RDW: 13 % (ref 12–15)
WBC: 5.41 10*3/uL (ref 3.50–10.80)

## 2017-02-12 LAB — COMPREHENSIVE METABOLIC PANEL
ALT: 25 U/L (ref 0–55)
AST (SGOT): 21 U/L (ref 5–34)
Albumin/Globulin Ratio: 1.3 (ref 0.9–2.2)
Albumin: 3.7 g/dL (ref 3.5–5.0)
Alkaline Phosphatase: 67 U/L (ref 37–106)
BUN: 21 mg/dL — ABNORMAL HIGH (ref 7.0–19.0)
Bilirubin, Total: 0.5 mg/dL (ref 0.2–1.2)
CO2: 25 mEq/L (ref 22–29)
Calcium: 9.1 mg/dL (ref 8.5–10.5)
Chloride: 106 mEq/L (ref 100–111)
Creatinine: 0.8 mg/dL (ref 0.6–1.0)
Globulin: 2.8 g/dL (ref 2.0–3.6)
Glucose: 105 mg/dL — ABNORMAL HIGH (ref 70–100)
Potassium: 4.1 mEq/L (ref 3.5–5.1)
Protein, Total: 6.5 g/dL (ref 6.0–8.3)
Sodium: 140 mEq/L (ref 136–145)

## 2017-02-12 LAB — HEMOGLOBIN A1C
Average Estimated Glucose: 99.7 mg/dL
Hemoglobin A1C: 5.1 % (ref 4.6–5.9)

## 2017-02-12 LAB — GFR: EGFR: >60

## 2017-02-12 LAB — B-TYPE NATRIURETIC PEPTIDE: B-Natriuretic Peptide: 115 pg/mL — ABNORMAL HIGH (ref 0–100)

## 2017-02-12 LAB — T3, FREE: T3, Free: 2.21 pg/mL (ref 1.71–3.71)

## 2017-02-12 LAB — T4, FREE: T4 Free: 1.1 ng/dL (ref 0.70–1.48)

## 2017-02-12 MED ORDER — TECHNETIUM TC 99M PENTETATE AEROSOL
41.2000 | PACK | Freq: Once | INTRAVENOUS | Status: AC | PRN
Start: 2017-02-12 — End: 2017-02-12
  Administered 2017-02-12: 11:00:00 41.2 via RESPIRATORY_TRACT
  Filled 2017-02-12: qty 75

## 2017-02-12 MED ORDER — KETOROLAC TROMETHAMINE 30 MG/ML IJ SOLN
15.00 mg | Freq: Once | INTRAMUSCULAR | Status: AC
Start: 2017-02-12 — End: 2017-02-12
  Administered 2017-02-12: 17:00:00 15 mg via INTRAVENOUS
  Filled 2017-02-12: qty 1

## 2017-02-12 MED ORDER — CYCLOBENZAPRINE HCL 10 MG PO TABS
5.00 mg | ORAL_TABLET | Freq: Two times a day (BID) | ORAL | Status: DC
Start: 2017-02-12 — End: 2017-02-17
  Administered 2017-02-12 – 2017-02-16 (×5): 5 mg via ORAL
  Filled 2017-02-12 (×14): qty 1

## 2017-02-12 MED ORDER — TECHNETIUM TC 99M ALBUMIN AGGREGATED
6.30 | Freq: Once | Status: AC | PRN
Start: 2017-02-12 — End: 2017-02-12
  Administered 2017-02-12: 11:00:00 6 via INTRAVENOUS
  Filled 2017-02-12: qty 10

## 2017-02-12 NOTE — Progress Notes (Signed)
[facility], [facility]  Answering service [phone]  Progress Note      Date Time: 02/12/17 5:37 PM  Patient Name: [name]  Attending Physician: Lamar Benes, MD      Subjective:   Patient Seen and Examined. The notes from the last 24 hours were reviewed. Discussed with patient and nursing at length.    Pt states that her R groin pain is very severe radiated to abd and back. Sometimes also to thigh    Review of Systems:    Aside from above, No headache. No Eyes, Ear, Nose, Throat problem. No chest pain, orthopnea or shortness or breath or cough, No abdominal pain, no nausea or vomiting. No pain or weakness in upper or lower extremity, no psychiatric, neurological, endocrine or hematological complaints other than above.        Physical Exam:     Vitals:    02/12/17 1704   BP: 112/56   Pulse: (!) 58   Resp:    Temp: 98.3 F (36.8 C)   SpO2: 92%       Intake and Output Summary (Last 24 hours) at Date Time  No intake or output data in the 24 hours ending 02/12/17 1737    General: Awake, no acute distress.  HEENT: Normocephalic. No icter or congestion  Neck: supple, no lymphadenopathy, no thyromegaly, no JVD, no carotid bruits  Cardiovascular: S1, S2 regular rhythm and rate, no murmurs, rubs or gallops  Lungs: Bilateral breath sounds, clear to auscultation bilaterally, No wheezing, rhonchi, or rales  Abdomen: soft, tender to palpation on RLQ and R groin, no rebound, non-distended; no palpable masses. R groin no masses felt   Extremities: no clubbing, cyanosis, or edema  Neuro: Exam grossly  non focal  Skin: no rashes or lesions noted      Meds:     Scheduled Meds:  Current Facility-Administered Medications   Medication Dose Route Frequency   . allopurinol  300 mg Oral QHS   . amLODIPine  10 mg Oral Daily   . aspirin  81 mg Oral Daily   . atorvastatin  80 mg Oral Daily   . citalopram  40 mg Oral Daily   . clopidogrel  75 mg Oral QHS   . cyclobenzaprine  5 mg Oral Q12H SCH   . ferrous  sulfate  324 mg Oral Daily   . heparin (porcine)  5,000 Units Subcutaneous Q8H SCH   . isosorbide mononitrate  30 mg Oral Daily   . levothyroxine  75 mcg Oral Daily at 0600   . metoprolol succinate XL  50 mg Oral BID   . montelukast  10 mg Oral QHS   . pantoprazole  40 mg Oral QAM AC     Continuous Infusions:  PRN Meds:.acetaminophen **OR** acetaminophen, morphine, naloxone, ondansetron **OR** ondansetron, polyethylene glycol, traZODone  I personally reviewed all of the medications        Labs:     Results     Procedure Component Value Units Date/Time    Hemoglobin A1C [[id number]] Collected:  02/11/17 1918    Specimen:  Blood Updated:  02/12/17 1624     Hemoglobin A1C 5.1 %      Average Estimated Glucose 99.7 mg/dL     T3, free [[id number]] Collected:  02/12/17 0513    Specimen:  Blood Updated:  02/12/17 0935     T3, Free 2.21 pg/mL     Narrative:       Start tomorrow if already  done today    T4, free [[id number]] Collected:  02/12/17 0513    Specimen:  Blood Updated:  02/12/17 0658     T4 Free 1.10 ng/dL     Narrative:       Start tomorrow if already done today    B-type Natriuretic Peptide [[id number]]  (Abnormal) Collected:  02/12/17 0513    Specimen:  Blood Updated:  02/12/17 0641     B-Natriuretic Peptide 115 (H) pg/mL     Narrative:       Start tomorrow if already done today    Comprehensive metabolic panel [[id number]]  (Abnormal) Collected:  02/12/17 0513    Specimen:  Blood Updated:  02/12/17 0636     Glucose 105 (H) mg/dL      BUN 06.3 (H) mg/dL      Creatinine 0.8 mg/dL      Sodium 016 mEq/L      Potassium 4.1 mEq/L      Chloride 106 mEq/L      CO2 25 mEq/L      Calcium 9.1 mg/dL      Protein, Total 6.5 g/dL      Albumin 3.7 g/dL      AST (SGOT) 21 U/L      ALT 25 U/L      Alkaline Phosphatase 67 U/L      Bilirubin, Total 0.5 mg/dL      Globulin 2.8 g/dL      Albumin/Globulin Ratio 1.3    Narrative:       Start tomorrow if already done today    GFR [[id number]] Collected:  02/12/17 0513     Updated:  02/12/17  0636     EGFR >60.0    Narrative:       Start tomorrow if already done today    CBC without differential [[id number]]  (Abnormal) Collected:  02/12/17 0513    Specimen:  Blood from Blood Updated:  02/12/17 0614     WBC 5.41 x10 3/uL      Hgb 11.6 (L) g/dL      Hematocrit 70.6 (L) %      Platelets 170 x10 3/uL      RBC 3.68 (L) x10 6/uL      MCV 96.5 fL      MCH 31.5 pg      MCHC 32.7 g/dL      RDW 13 %      MPV 11.4 fL      Nucleated RBC 0.0 /100 WBC      Absolute NRBC 0.00 x10 3/uL     Narrative:       Start tomorrow if already done today    Vitamin D,25 OH, Total [[id number]]  (Abnormal) Collected:  02/11/17 1918     Updated:  02/11/17 2156     Vitamin D, 25 OH, Total 15 (L) ng/mL     Narrative:       Start tomorrow if already done today    Vitamin B12 [[id number]] Collected:  02/11/17 1918    Specimen:  Blood Updated:  02/11/17 2156     Vitamin B-12 315 pg/mL     Narrative:       Start tomorrow if already done today    Hemolysis index [[id number]]  (Abnormal) Collected:  02/11/17 1918     Updated:  02/11/17 2118     Hemolysis Index 33 (H)    Narrative:       Start tomorrow if already done today    TSH [[id number]]  Collected:  02/11/17 1918    Specimen:  Blood Updated:  02/11/17 2009     TSH 1.55 uIU/mL     Narrative:       Start tomorrow if already done today    B-type Natriuretic Peptide [[id number]]  (Abnormal) Collected:  02/11/17 1918    Specimen:  Blood Updated:  02/11/17 2002     B-Natriuretic Peptide 160 (H) pg/mL     Narrative:       Start tomorrow if already done today    D-Dimer [[id number]] Collected:  02/11/17 1918     Updated:  02/11/17 1959     D-Dimer <0.27 ug/mL FEU     Narrative:       Start tomorrow if already done today    Comprehensive metabolic panel [[id number]]  (Abnormal) Collected:  02/11/17 1918    Specimen:  Blood Updated:  02/11/17 1948     Glucose 122 (H) mg/dL      BUN 08.6 mg/dL      Creatinine 0.8 mg/dL      Sodium 578 mEq/L      Potassium 4.0 mEq/L      Chloride 107 mEq/L      CO2 26  mEq/L      Calcium 8.9 mg/dL      Protein, Total 6.6 g/dL      Albumin 3.8 g/dL      AST (SGOT) 23 U/L      ALT 25 U/L      Alkaline Phosphatase 71 U/L      Bilirubin, Total 0.8 mg/dL      Globulin 2.8 g/dL      Albumin/Globulin Ratio 1.4    Narrative:       Start tomorrow if already done today    GFR [[id number]] Collected:  02/11/17 1918     Updated:  02/11/17 1948     EGFR >60.0    Narrative:       Start tomorrow if already done today    Uric acid [[id number]]  (Abnormal) Collected:  02/11/17 1918    Specimen:  Blood Updated:  02/11/17 1948     Uric acid 6.9 (H) mg/dL     Narrative:       Start tomorrow if already done today    CBC without differential [[id number]]  (Abnormal) Collected:  02/11/17 1849    Specimen:  Blood from Blood Updated:  02/11/17 1939     WBC 5.13 x10 3/uL      Hgb 12.0 g/dL      Hematocrit 03.4 (L) %      Platelets 163 x10 3/uL      RBC 3.83 (L) x10 6/uL      MCV 95.3 fL      MCH 31.3 pg      MCHC 32.9 g/dL      RDW 13 %      MPV 11.3 fL      Nucleated RBC 0.0 /100 WBC      Absolute NRBC 0.00 x10 3/uL     Narrative:       Start tomorrow if already done today           Radiology Results (24 Hour)     Procedure Component Value Units Date/Time    CT Abdomen Pelvis WO IV/ WO PO Cont [[id number]] Collected:  02/12/17 1422    Order Status:  Completed Updated:  02/12/17 1430    Narrative:       CLINICAL HISTORY: Abdominal pain  COMPARISON: None    TECHNIQUE: Unenhanced axial images of the abdomen and pelvis were  performed. The following dose reduction techniques were utilized:  Automated exposure control and/or adjustment of the mA and/or kV  according to patient size, and the use of iterative reconstruction  technique.    FINDINGS:    Mild scarring at the lung bases. Coronary artery disease with stent  placement.  Small hiatal hernia.     Suboptimal evaluation of the solid abdominal organs secondary to lack of  intravenous contrast. Prior cholecystectomy has been performed. The  unenhanced liver,  spleen, and pancreas are unremarkable. Bilateral  nonobstructive renal calculi. The largest right-sided renal calculus  measures 3 mm. Largest left upper pole renal calculus measures 1 mm.    The small and large bowel loops are normal in caliber. There is no  evidence of obstruction. Retained stool within the colon. The appendix  is normal.    Atherosclerosis of the normal caliber abdominal aorta. No abdominal or  pelvic lymphadenopathy.    Prior hysterectomy has been performed. The bladder is partially  distended with urine.    Degenerative change lumbar spine. Infiltration within the subcutaneous  tissues of the anterior abdominal wall, likely prior injection.      Impression:         1. Nephrolithiasis. No ureteral calculi.  2. No detectable acute inflammatory process within the limitations of  the unenhanced exam.    Launa Flight, MD   02/12/2017 2:26 PM    NM Pulmonary Ventilation And Perfusion [employer]) [[id number]] Collected:  02/12/17 1054    Order Status:  Completed Updated:  02/12/17 1059    Narrative:       History: Chest pain.    Comment:  A ventilation scan was performed with 41.2 mCi Tc-99 DTPA aerosol and a  perfusion scan was performed with 6 mCi Tc-99 M MAA injected  intravenously. Correlation is made to chest radiograph yesterday.    There is mild heterogeneous tracer distribution in matched areas on  ventilation and perfusion images.   There are no significant mismatched  segmental perfusion defects.      Impression:         Low  probability for pulmonary embolism.    Clide Cliff, MD   02/12/2017 10:55 AM    XR Chest AP Portable [[id number]] Collected:  02/11/17 1945    Order Status:  Completed Updated:  02/11/17 1950    Narrative:       CLINICAL HISTORY: Concern for pneumonia    COMPARISON: 02/10/2017    TECHNIQUE: Single portable semiupright AP chest    FINDINGS:    Segmental atelectasis at the left lung base. The lungs are otherwise  clear. The heart size is accentuated by portable  acquisition.  Mediastinal contours are stable.      Impression:         1. Left basilar atelectasis.    Launa Flight, MD   02/11/2017 7:46 PM    [place] Groin Pseudoaneurysm Right W Dopp [[id number]] Collected:  02/11/17 1811    Order Status:  Completed Updated:  02/11/17 1816    Narrative:       CLINICAL HISTORY: Recent catheterization, concern for subtle aneurysm    COMPARISON: None    TECHNIQUE: Targeted sonography the right groin was performed.    FINDINGS:    No pseudoaneurysm is identified. There is no significant hematoma.  Normal arterial and venous waveforms within the right common femoral  vessels.      Impression:         1. No evidence of pseudoaneurysm or AV fistula.    Launa Flight, MD   02/11/2017 6:12 PM             Imaging personally reviewed,  ECG, tele personally reviewed,           Assessment/ Plan     Patient Active Problem List   Diagnosis   . Chest pain possibly costochondritis    . Coronary artery disease   . HTN (hypertension)   . HLD (hyperlipidemia)   . Gout   . Nephrolithiasis   . History of pulmonary embolism   . Hypothyroid   . Obesity   . Depression   . GERD (gastroesophageal reflux disease)   . Anemia   . OSA (obstructive sleep apnea)     [name] is a 67 y.o. female with PMH PE, GERD, CAD with multiple stents in the past, HLD, hypothyroidism. Transferred from [facility] to [facility] on 01/04 for further evaluation of chest pain. Additonally pt has presented R groin pain since previous cath 1 month ago.    - Chest pain - resolved   Cath 1 month ago, no need for further intervention at this time  CXR neg, VQ scan (pt allergic to contrast) - low suspicion for PE    - R groin pain - severe - possible neuropathy post cath  CT AP today showed no abnormalities   Duplex US showed no evidence of pseudoaneurysm   Cont morphine + Toradol x1 + Flexeril - pt unable to tolerate gaba or Lyrica in the past      - HTN,   Cont amlodipine, Imdur, Toprol     - CAD s/p stent 1 month ago  Cont ASA, Plavix, BB as  above    - Hypothyroidism  Cont  Synthroid, TSH WNL    - Depression  Cont home Celexa    - HLD  Cont high dose statin    - HX of gout  Cont allopurinol    - OSA, cont nocturnal 02 NC    GI PPX/ GERD: Protonix  DVT PPX: Heparin SQ  Dispo: home when pain controlled, likely in am           Signed by: Eliezer Bottom, MD    [facility] contact:    Tyson Babinski [facility]:  [phone]      [facility]:    Tower NP (M-F 8am-5pm) [phone]  [facility] MD (M-F 8am-5pm)  [phone]    [facility] NP (M-F 8am-5pm) [phone]  [facility] MD (M-F 8am-5pm) [phone]    24 hour answering service M-F 5pm-8am, Saturday and Sunday and if no answer on above numbers, [phone], please request attending physician covering patient that day    *This note was generated by the Epic EMR system/ Dragon speech recognition and may contain inherent errors or omissions not intended by the user. Grammatical errors, random word insertions, deletions, pronoun errors and incomplete sentences are occasional consequences of this technology due to software limitations. Not all errors are caught or corrected. If there are questions or concerns about the content of this note or information contained within the body of this dictation they should be addressed directly with the author for clarification.*

## 2017-02-12 NOTE — Plan of Care (Signed)
Problem: Hemodynamic Status: Cardiac  Goal: Stable vital signs and fluid balance  Outcome: Progressing   02/12/17 1809   Goal/Interventions addressed this shift   Stable vital signs and fluid balance Monitor/assess vital signs and telemetry per unit protocol;Weigh on admission and record weight daily;Assess signs and symptoms associated with cardiac rhythm changes;Monitor intake/output per unit protocol and/or LIP order;Monitor for leg swelling/edema and report to LIP if abnormal;Monitor lab values     Transferred from [facility] at 1700. Patient is AxO4 and VSS. Pt is sinus brady on tele, asymptomatic. Patient is currently on room air, has 2L when sleeping. No complaints of SOB, dizziness, or chest pain at the moment. Still complaining of leg pain unrelieved by Toradol given in [facility]. . The plan is to keep patient for observation, possible discharge tomorrow. Pain control important.  Fall and safety precautions in place. Call bell within reach. Will continue to monitor.

## 2017-02-12 NOTE — UM Notes (Signed)
02/11/17 1811  Place for Observation Services Once         67 y.o. female with PMH of MI, PE, GERD, CAD with stents, HLD, MI, hypothyroidism was transferred from [facility] to [facility] on 02/11/17 for further evaluation of chest pain and possible cardiac cath. Associated symptoms include R groin pain from stent incision site. Patient complains of chest pain, SOB with exsersion. Denies palpitations, fever, chills, recent illness.     02/11/17 1633  98.4 F (36.9 C)  --   51  18  103/54  93 %  RA  103 kg (227 lb)     CXR-Left basilar atelectasis.    Labs- bnp 160, glu 122,  Uric acid 6.9, hct 36.5, hgb 12, bun 19    02/12/17- BUN 21    02/11/17  A/P Medicine:     Chest pain, transferred form [facility] for possible cardiac cath, cards following, Chest X-ray w/o acute finding     Groin pain, Right groin pain at the access site for stent, [place] with no evidence of pseudoaneurysm or AV fistula, Morphine for pain ordered    HTN, controlled, on BB, Norvasc    H/O MI: on BB, Plavix and ASA, cath on 01/13/17, recent abnormal stress test    CAD, stable on ASA    H/O PE, d-dimer ordered, VQ scan ordered, allergies to contrast, no AC     Hypothyroidism: Synthroid , TSH ordered     Depression; on Celexa    HLD, on Lipitor, recent lipid panel in EPIC    Gout, on Allopurinol    OSA: cont nocturnal 02 NC    GI PPX/ GERD: Protonix    DVT PPX: Heparin SQ    Dispo: Possible in AM     Meds:  Morphine 4mg  IV x1 (1/3) and x2 )1/4); Zofran 4mg  IV, allopurinol 300 mg po hs    US of the Groin showed   1. No evidence of pseudoaneurysm or AV fistula.    Cardiology:    #Patient's chest pain is unlikely due to cardiac etiology as it persists with no increase in Troponin and no ECG evidence of acute ST-T changes.     #concerned her R groin pain could be due to R femoral artery pseudoaneurysm. Thus will do R femoral artery duplex US.    -[place] heparin. Cont dual antiplatelet therapy.    -Cont Metoprolol, Amlodipine, and Imdur.  -Cont High dose  Atrovastatin.   -Tentative [place] in AM if Duplex US negative            Alba Destine, BSN, RN         Utilization Review Nurse (PRN)                 Charlott Holler  Ph: [phone] (Voicemail)  Case Management Office:  Ph: [phone]  Fax: [phone]

## 2017-02-12 NOTE — Progress Notes (Signed)
The clinical technician attempted twice to put an IV, but was unsuccessful. RN was notified of the unsuccessful attempts.

## 2017-02-12 NOTE — Progress Notes (Signed)
Assumed patient care at 1500. Patient reporting hard to control pain in right groin. MD re-evaluated patient following CT scan and decided patient should be admitted for observation. Report called to receiving unit. Patient aware of POC. All belongings sent with patient.

## 2017-02-13 MED ORDER — OXYCODONE HCL 5 MG PO TABS
5.00 mg | ORAL_TABLET | ORAL | Status: DC | PRN
Start: 2017-02-13 — End: 2017-02-17
  Administered 2017-02-13 – 2017-02-17 (×20): 5 mg via ORAL
  Filled 2017-02-13 (×21): qty 1

## 2017-02-13 MED ORDER — METOPROLOL SUCCINATE ER 25 MG PO TB24
25.00 mg | ORAL_TABLET | Freq: Every day | ORAL | Status: DC
Start: 2017-02-13 — End: 2017-02-15
  Administered 2017-02-13 – 2017-02-14 (×2): 25 mg via ORAL
  Filled 2017-02-13 (×3): qty 1

## 2017-02-13 NOTE — UM Notes (Signed)
Adm as   02/11/17 1811  Place for Observation Services Once         Change to   02/13/17 1048  Admit to Inpatient Once             Internal Medicine  Progress note      Pt states that her R groin pain is very severe radiated to abd and back. Sometimes also to thigh    Assessment/ Plan         Patient Active Problem List   Diagnosis   . Chest pain possibly costochondritis    . Coronary artery disease   . HTN (hypertension)   . HLD (hyperlipidemia)   . Gout   . Nephrolithiasis   . History of pulmonary embolism   . Hypothyroid   . Obesity   . Depression   . GERD (gastroesophageal reflux disease)   . Anemia   . OSA (obstructive sleep apnea)     [name] a 67 y.o.femalewith PMH PE, GERD, CAD with multiple stents in the past, HLD, hypothyroidism. Transferred from [facility] to [facility] on 01/04 for further evaluation of chest pain. Additonally pt has presented R groin pain since previous cath 1 month ago.    - Chest pain - resolved   Cath 1 month ago, no need for further intervention at this time  CXR neg, VQ scan (pt allergic to contrast) - low suspicion for PE    - R groin pain - severe - possible neuropathy post cath  CT AP today showed no abnormalities   Duplex US showed no evidence of pseudoaneurysm   Cont morphine + Toradol x1 + Flexeril - pt unable to tolerate gaba or Lyrica in the past      - HTN,   Cont amlodipine, Imdur, Toprol     - CAD s/p stent 1 month ago  Cont ASA, Plavix, BB as above    - Hypothyroidism  Cont  Synthroid, TSH WNL    - Depression  Cont home Celexa    - HLD  Cont high dose statin    - HX of gout  Cont allopurinol    - OSA, cont nocturnal 02 NC    GI PPX/ GERD: Protonix  DVT PPX: Heparin SQ  Dispo: home when pain controlled, likely in am       Meds:     Scheduled Meds:         Current Facility-Administered Medications   Medication Dose Route Frequency   . allopurinol  300 mg Oral QHS   . amLODIPine  10 mg Oral Daily   . aspirin  81 mg Oral Daily   . atorvastatin  80 mg Oral Daily    . citalopram  40 mg Oral Daily   . clopidogrel  75 mg Oral QHS   . cyclobenzaprine  5 mg Oral Q12H SCH   . ferrous sulfate  324 mg Oral Daily   . heparin (porcine)  5,000 Units Subcutaneous Q8H SCH   . isosorbide mononitrate  30 mg Oral Daily   . levothyroxine  75 mcg Oral Daily at 0600   . metoprolol succinate XL  50 mg Oral BID   . montelukast  10 mg Oral QHS   . pantoprazole  40 mg Oral QAM AC     Continuous Infusions:  PRN Meds:.acetaminophen **OR** acetaminophen, morphine, naloxone, ondansetron **OR** ondansetron, polyethylene glycol, traZODone      Value Min Max   Temp 97.8 F (36.6 C) 98.7 F (37.1 C)  Heart Rate 47 68   Resp Rate 18 20   BP: Systolic 99 137   BP: Diastolic 54 63   SpO2 90 % 95 %

## 2017-02-13 NOTE — Plan of Care (Addendum)
Problem: Moderate/High Fall Risk Score >5  Goal: Patient will remain free of falls  Outcome: Progressing      Problem: Hemodynamic Status: Cardiac  Goal: Stable vital signs and fluid balance  Outcome: Progressing      Comments:   [name] is AOx4, Vitals stable except for SB at times, SB-SR on tele, she is saturating in the  Patient still reports of pain at her groin site, open to air/clean/dry. No redness. Pt been receiving morphine IV overnight. Added oxycodone to pain regimen. Denies any chest pain during shift.     Sinus Brady-SR, lowest in the mid 40s  -metoprolol succinate XL 50mg  decreased to 25mg     Pt ambulated around the unit x2, in the afternoon slightly dyspneic on exertion, but recovers quickly.    Ambulated again in the afternoon x2, also did pulse ox testing. See template  Pt uses 2L NC at home for bedtime usage.     Fall mat in place, bed alarm on, call bell within reach.

## 2017-02-13 NOTE — Progress Notes (Signed)
[facility], [facility]  Answering service [phone]  Progress Note      Date Time: 02/13/17 5:13 PM  Patient Name: [name]  Attending Physician: Lamar Benes, MD      Subjective:   Patient Seen and Examined. The notes from the last 24 hours were reviewed. Discussed with patient and nursing at length.    Bradycardic overnight. Recently started on Toprol slightly lightheaded.   R groin pain still present - better with oxy     Review of Systems:    Aside from above, No headache. No Eyes, Ear, Nose, Throat problem. No chest pain, orthopnea or shortness or breath or cough, No abdominal pain, no nausea or vomiting. No pain or weakness in upper or lower extremity, no psychiatric, neurological, endocrine or hematological complaints other than above.        Physical Exam:     Vitals:    02/13/17 1702   BP: 131/75   Pulse: (!) 53   Resp: 19   Temp:    SpO2: 94%       Intake and Output Summary (Last 24 hours) at Date Time    Intake/Output Summary (Last 24 hours) at 02/13/17 1713  Last data filed at 02/13/17 1500   Gross per 24 hour   Intake              360 ml   Output              550 ml   Net             -190 ml       General: Awake, no acute distress.  HEENT: Normocephalic. No icter or congestion  Neck: supple, no lymphadenopathy, no thyromegaly, no JVD, no carotid bruits  Cardiovascular: S1, S2 regular rhythm and rate, no murmurs, rubs or gallops  Lungs: Bilateral breath sounds, clear to auscultation bilaterally, No wheezing, rhonchi, or rales  Abdomen: soft, tender to palpation on RLQ and R groin, no rebound, non-distended; no palpable masses. R groin no masses felt   Extremities: no clubbing, cyanosis, or edema  Neuro: Exam grossly  non focal  Skin: no rashes or lesions noted      Meds:     Scheduled Meds:  Current Facility-Administered Medications   Medication Dose Route Frequency   . allopurinol  300 mg Oral QHS   . amLODIPine  10 mg Oral Daily   . aspirin  81 mg Oral Daily   .  atorvastatin  80 mg Oral Daily   . citalopram  40 mg Oral Daily   . clopidogrel  75 mg Oral QHS   . cyclobenzaprine  5 mg Oral Q12H SCH   . ferrous sulfate  324 mg Oral Daily   . heparin (porcine)  5,000 Units Subcutaneous Q8H SCH   . isosorbide mononitrate  30 mg Oral Daily   . levothyroxine  75 mcg Oral Daily at 0600   . metoprolol succinate XL  25 mg Oral Daily   . montelukast  10 mg Oral QHS   . pantoprazole  40 mg Oral QAM AC     Continuous Infusions:  PRN Meds:.acetaminophen **OR** acetaminophen, morphine, naloxone, ondansetron **OR** ondansetron, oxyCODONE, polyethylene glycol, traZODone  I personally reviewed all of the medications        Labs:     Results     ** No results found for the last 24 hours. **           Radiology  Results (24 Hour)     ** No results found for the last 24 hours. **             Imaging personally reviewed,  ECG, tele personally reviewed,           Assessment/ Plan     Patient Active Problem List   Diagnosis   . Chest pain possibly costochondritis    . Coronary artery disease   . HTN (hypertension)   . HLD (hyperlipidemia)   . Gout   . Nephrolithiasis   . History of pulmonary embolism   . Hypothyroid   . Obesity   . Depression   . GERD (gastroesophageal reflux disease)   . Anemia   . OSA (obstructive sleep apnea)     [name] is a 67 y.o. female with PMH PE, GERD, CAD with multiple stents in the past, HLD, hypothyroidism. Transferred from [facility] to [facility] on 01/04 for further evaluation of chest pain. Additonally pt has presented R groin pain since previous cath 1 month ago.    - Chest pain - resolved   Cath 1 month ago, no need for further intervention at this time  CXR neg, VQ scan (pt allergic to contrast) - low suspicion for PE    - R groin pain - severe - possible neuropathy post cath  CT AP today showed no abnormalities   Duplex US showed no evidence of pseudoaneurysm   Started on oxy PRN + morphine for BTP + Flexeril      - Symptomatic bradycardia -   Toprol decreased to 25 mg  daily today     - HTN,   Cont amlodipine, Imdur, Toprol     - CAD s/p stent 1 month ago  Cont ASA, Plavix, BB as above    - Hypothyroidism  Cont  Synthroid, TSH WNL    - Depression  Cont home Celexa    - HLD  Cont high dose statin    - HX of gout  Cont allopurinol    - OSA, cont nocturnal 02 NC    GI PPX/ GERD: Protonix  DVT PPX: Heparin SQ  Dispo: home when pain and HR controlled, likely in am           Signed by: Eliezer Bottom, MD    [facility] contact:    Tyson Babinski [facility]:  [phone]      [facility]:    Tower NP (M-F 8am-5pm) [phone]  Tower MD (M-F 8am-5pm)  [phone]    [facility] NP (M-F 8am-5pm) [phone]  [facility] MD (M-F 8am-5pm) [phone]    24 hour answering service M-F 5pm-8am, Saturday and Sunday and if no answer on above numbers, [phone], please request attending physician covering patient that day    *This note was generated by the Epic EMR system/ Dragon speech recognition and may contain inherent errors or omissions not intended by the user. Grammatical errors, random word insertions, deletions, pronoun errors and incomplete sentences are occasional consequences of this technology due to software limitations. Not all errors are caught or corrected. If there are questions or concerns about the content of this note or information contained within the body of this dictation they should be addressed directly with the author for clarification.*

## 2017-02-13 NOTE — Progress Notes (Signed)
PULSE OXIMETRY TESTING:   Document patient's oxygen at rest on room air:   "___91__% on room air, at rest" (exact number, NO RANGES please)   "_____% on oxygen, at rest, at_____LPM via NC (TO SHOW IMPROVEMENT)     IF 88% OR BELOW on room air, STOP HERE,   IF NOT ambulate patient on room air with exertion and document below:   "__85___% on room air, with exertion" (must be 88% or below for pt to qualify for home oxygen)   "__93___% on oxygen, with exertion, at__6___LPM via NC (how much oxygen does patient need to get sats up above 88%?)     All three tests must be during the same session.   Please document in a PROGRESS NOTE.   Testing to qualify for home oxygen must be no earlier than 48 hours prior to discharge, or it will need to be repeated.

## 2017-02-13 NOTE — Plan of Care (Signed)
Problem: Safety  Goal: Patient will be free from injury during hospitalization  Outcome: Progressing   02/13/17 1610   Goal/Interventions addressed this shift   Patient will be free from injury during hospitalization  Assess patient's risk for falls and implement fall prevention plan of care per policy;Provide and maintain safe environment;Use appropriate transfer methods;Hourly rounding;Ensure appropriate safety devices are available at the bedside;Provide alternative method of communication if needed (communication boards, writing)       Problem: Pain  Goal: Pain at adequate level as identified by patient  Outcome: Progressing   02/13/17 0233   Goal/Interventions addressed this shift   Pain at adequate level as identified by patient Identify patient comfort function goal;Assess pain on admission, during daily assessment and/or before any "as needed" intervention(s);Reassess pain within 30-60 minutes of any procedure/intervention, per Pain Assessment, Intervention, Reassessment (AIR) Cycle;Evaluate if patient comfort function goal is met;Evaluate patient's satisfaction with pain management progress       Comments: Orientation:  A&O x4   Rhythm on tele: Sinus Brady    Respiratory: RA; 2L when sleeping   Ambulation: independent, x1 assist  Pain: R groin pain   Lines/Drips: 20G R EJ   GI/GU:  ambulates to restroom     Critical Lab/Imaging/Procedures: multiple attempts made by night tech for IV insertion. Pt. is a very hard stick. Currently Right EJ C/D/I     Comments:   - see flowsheet for complete assessment and vitals   - continues to have R groin pain; controlled with PRN morphine Q4H   - c/o nausea prior to bedtime; zofran SL given to good relief    Plan:   - possible discharge 1/6?  - pain management    Call bell within reach and belongings at bedside. Fall, bleeding and safety precautions in place. Will continue to monitor.

## 2017-02-13 NOTE — Progress Notes (Signed)
PULSE OXIMETRY TESTING:   Document patient's oxygen at rest on room air:   "_____% on room air, at rest" (exact number, NO RANGES please)   "_____% on oxygen, at rest, at_____LPM via NC (TO SHOW IMPROVEMENT)     IF 88% OR BELOW on room air, STOP HERE,  IF NOT ambulate patient on room air with exertion and document below:  "_____% on room air, with exertion" (must be 88% or below for pt to qualify for home oxygen)   "_____% on oxygen, with exertion, at_____LPM via NC (how much oxygen does patient need to get sats up above 88%?)     All three tests must be during the same session.   Please document in a PROGRESS NOTE.   Testing to qualify for home oxygen must be no earlier than 48 hours prior to discharge, or it will need to be repeated.

## 2017-02-14 ENCOUNTER — Inpatient Hospital Stay: Payer: Medicare Other

## 2017-02-14 LAB — CBC
Absolute NRBC: 0 10*3/uL
Hematocrit: 37.4 % (ref 37.0–47.0)
Hgb: 12.3 g/dL (ref 12.0–16.0)
MCH: 31.7 pg (ref 28.0–32.0)
MCHC: 32.9 g/dL (ref 32.0–36.0)
MCV: 96.4 fL (ref 80.0–100.0)
MPV: 11.4 fL (ref 9.4–12.3)
Nucleated RBC: 0 /100 WBC (ref 0.0–1.0)
Platelets: 162 10*3/uL (ref 140–400)
RBC: 3.88 10*6/uL — ABNORMAL LOW (ref 4.20–5.40)
RDW: 13 % (ref 12–15)
WBC: 4.86 10*3/uL (ref 3.50–10.80)

## 2017-02-14 LAB — COMPREHENSIVE METABOLIC PANEL
ALT: 25 U/L (ref 0–55)
AST (SGOT): 21 U/L (ref 5–34)
Albumin/Globulin Ratio: 1.3 (ref 0.9–2.2)
Albumin: 3.8 g/dL (ref 3.5–5.0)
Alkaline Phosphatase: 66 U/L (ref 37–106)
BUN: 21 mg/dL — ABNORMAL HIGH (ref 7.0–19.0)
Bilirubin, Total: 0.5 mg/dL (ref 0.2–1.2)
CO2: 25 mEq/L (ref 22–29)
Calcium: 9.2 mg/dL (ref 8.5–10.5)
Chloride: 106 mEq/L (ref 100–111)
Creatinine: 0.8 mg/dL (ref 0.6–1.0)
Globulin: 3 g/dL (ref 2.0–3.6)
Glucose: 98 mg/dL (ref 70–100)
Potassium: 4.4 mEq/L (ref 3.5–5.1)
Protein, Total: 6.8 g/dL (ref 6.0–8.3)
Sodium: 141 mEq/L (ref 136–145)

## 2017-02-14 LAB — GFR: EGFR: >60

## 2017-02-14 LAB — B-TYPE NATRIURETIC PEPTIDE: B-Natriuretic Peptide: 168 pg/mL — ABNORMAL HIGH (ref 0–100)

## 2017-02-14 MED ORDER — FUROSEMIDE 10 MG/ML IJ SOLN
40.00 mg | Freq: Once | INTRAMUSCULAR | Status: AC
Start: 2017-02-14 — End: 2017-02-14
  Administered 2017-02-14: 40 mg via INTRAVENOUS
  Filled 2017-02-14: qty 4

## 2017-02-14 MED ORDER — VITAMIN D (ERGOCALCIFEROL) 1.25 MG (50000 UT) PO CAPS
50000.00 [IU] | ORAL_CAPSULE | ORAL | Status: DC
Start: 2017-02-14 — End: 2017-02-17
  Administered 2017-02-14: 50000 [IU] via ORAL
  Filled 2017-02-14: qty 1

## 2017-02-14 NOTE — Plan of Care (Signed)
Problem: Pain  Goal: Pain at adequate level as identified by patient  Outcome: Progressing   02/14/17 0234   Goal/Interventions addressed this shift   Pain at adequate level as identified by patient Identify patient comfort function goal;Assess for risk of opioid induced respiratory depression, including snoring/sleep apnea. Alert healthcare team of risk factors identified.;Assess pain on admission, during daily assessment and/or before any "as needed" intervention(s);Reassess pain within 30-60 minutes of any procedure/intervention, per Pain Assessment, Intervention, Reassessment (AIR) Cycle;Evaluate if patient comfort function goal is met;Evaluate patient's satisfaction with pain management progress;Offer non-pharmacological pain management interventions;Include patient/patient care companion in decisions related to pain management as needed     Orientation: AOx4  Rhythm on tele: SR/B. HR 40-80s.  Oxygen/airway: RAA. 2L NC @ night. 6L on exertion.  Pain: Endorses R groin pain. PRN oxycodone   Lines/Drips: L EJ due to poor venous access  GI/GU: WNL. Continent x2. LBM 02/14/2017  - Cardiac diet.  Ambulation: Ambultory and independent. Stdby assist.  Fall Score: Moderate fall rirsk.  - Safety precautions maintained. Bed in lowest position, fall mat and bed alarm in place. Call bell and bedside table w/ belongings w/in reach.     Critical Lab/Imaging/Procedures:    Comments:   - No signs of acute distress or changes in pt status noted during this shift. Will continue to monitor for any changes.     Plan:   - Pain management. Continued R groin pain. PRN oxycodone given w/ some relief.  - Possible discharge today, 1/7.

## 2017-02-14 NOTE — Plan of Care (Addendum)
Problem: Pain  Goal: Pain at adequate level as identified by patient  Outcome: Progressing      Problem: Hemodynamic Status: Cardiac  Goal: Stable vital signs and fluid balance  Outcome: Progressing      Problem: Ineffective Gas Exchange  Goal: Effective breathing pattern  Outcome: Progressing      Comments: [name] is AOx4, Vitals stable except for SB at times, SB-SR on tele, she is saturating in the  Patient still reports of pain at her groin site, open to air/clean/dry. No redness. Pain mild-moderately controlled by tylenol and oxycodone.    Sinus Brady-SR, HR in the 50s vs 40s yesterday    1x IV lasix ordered due to pt needing 6L NC yesterday, SOB   -Xray/Echo ordered    Pt ambulated around the unit x2, in the afternoon   -O2 usage decreased to 3L NC vs 6LNC yesterday   -O2 template updated     Pt uses 2L NC at home for bedtime usage.     Fall mat in place, bed alarm on, call bell within reach.

## 2017-02-14 NOTE — Progress Notes (Signed)
PULSE OXIMETRY TESTING:   Document patient's oxygen at rest on room air:   "___93__% on room air, at rest" (exact number, NO RANGES please)   "_____% on oxygen, at rest, at_____LPM via NC (TO SHOW IMPROVEMENT)     IF 88% OR BELOW on room air, STOP HERE,   IF NOT ambulate patient on room air with exertion and document below:   "__88___% on room air, with exertion" (must be 88% or below for pt to qualify for home oxygen)   "__91___% on oxygen, with exertion, at__3___LPM via NC (how much oxygen does patient need to get sats up above 88%?)     All three tests must be during the same session.   Please document in a PROGRESS NOTE.   Testing to qualify for home oxygen must be no earlier than 48 hours prior to discharge, or it will need to be repeated.

## 2017-02-14 NOTE — Progress Notes (Signed)
[facility], [facility]  Answering service [phone]  Progress Note      Date Time: 02/14/17 6:10 PM  Patient Name: [name]  Attending Physician: [name], Mar*      Subjective:   Patient Seen and Examined. The notes from the last 24 hours were reviewed. Discussed with patient and nursing at length.    Bradycardic has improved. Pt requires 6L of O2 when walking. Off diuretics since admission.   Pain slightly better.     Review of Systems:    Aside from above, No headache. No Eyes, Ear, Nose, Throat problem. No chest pain, orthopnea or shortness or breath or cough, No abdominal pain, no nausea or vomiting. No pain or weakness in upper or lower extremity, no psychiatric, neurological, endocrine or hematological complaints other than above.        Physical Exam:     Vitals:    02/14/17 1727   BP:    Pulse:    Resp:    Temp:    SpO2: 92%       Intake and Output Summary (Last 24 hours) at Date Time    Intake/Output Summary (Last 24 hours) at 02/14/17 1810  Last data filed at 02/14/17 1700   Gross per 24 hour   Intake              702 ml   Output             1520 ml   Net             -818 ml       General: Awake, no acute distress.  HEENT: Normocephalic. No icter or congestion  Neck: supple,   Cardiovascular: S1, S2 regular rhythm and rate, no murmurs, rubs or gallops  Lungs: decreased breath sounds on both bases   Abdomen: soft, tender to palpation on RLQ and R groin, no rebound, non-distended; no palpable masses. R groin no masses felt   Extremities: no clubbing, cyanosis, or edema  Neuro: Exam grossly  non focal  Skin: no rashes or lesions noted      Meds:     Scheduled Meds:  Current Facility-Administered Medications   Medication Dose Route Frequency   . allopurinol  300 mg Oral QHS   . amLODIPine  10 mg Oral Daily   . aspirin  81 mg Oral Daily   . atorvastatin  80 mg Oral Daily   . citalopram  40 mg Oral Daily   . clopidogrel  75 mg Oral QHS   . cyclobenzaprine  5 mg Oral Q12H  SCH   . ferrous sulfate  324 mg Oral Daily   . heparin (porcine)  5,000 Units Subcutaneous Q8H SCH   . isosorbide mononitrate  30 mg Oral Daily   . levothyroxine  75 mcg Oral Daily at 0600   . metoprolol succinate XL  25 mg Oral Daily   . montelukast  10 mg Oral QHS   . pantoprazole  40 mg Oral QAM AC   . vitamin D (ergocalciferol)  50,000 Units Oral Weekly     Continuous Infusions:  PRN Meds:.acetaminophen **OR** acetaminophen, naloxone, ondansetron **OR** ondansetron, oxyCODONE, polyethylene glycol, traZODone  I personally reviewed all of the medications        Labs:     Results     Procedure Component Value Units Date/Time    B-type Natriuretic Peptide [[id number]]  (Abnormal) Collected:  02/14/17 1505    Specimen:  Blood Updated:  02/14/17 1547     B-Natriuretic Peptide 168 (H) pg/mL     Comprehensive metabolic panel [[id number]]  (Abnormal) Collected:  02/14/17 0446    Specimen:  Blood Updated:  02/14/17 0609     Glucose 98 mg/dL      BUN 40.9 (H) mg/dL      Creatinine 0.8 mg/dL      Sodium 811 mEq/L      Potassium 4.4 mEq/L      Chloride 106 mEq/L      CO2 25 mEq/L      Calcium 9.2 mg/dL      Protein, Total 6.8 g/dL      Albumin 3.8 g/dL      AST (SGOT) 21 U/L      ALT 25 U/L      Alkaline Phosphatase 66 U/L      Bilirubin, Total 0.5 mg/dL      Globulin 3.0 g/dL      Albumin/Globulin Ratio 1.3    Narrative:       Start tomorrow if already done today    GFR [[id number]] Collected:  02/14/17 0446     Updated:  02/14/17 0609     EGFR >60.0    Narrative:       Start tomorrow if already done today    CBC without differential [[id number]]  (Abnormal) Collected:  02/14/17 0446    Specimen:  Blood from Blood Updated:  02/14/17 0514     WBC 4.86 x10 3/uL      Hgb 12.3 g/dL      Hematocrit 46.9 %      Platelets 162 x10 3/uL      RBC 3.88 (L) x10 6/uL      MCV 96.4 fL      MCH 31.7 pg      MCHC 32.9 g/dL      RDW 13 %      MPV 11.4 fL      Nucleated RBC 0.0 /100 WBC      Absolute NRBC 0.00 x10 3/uL     Narrative:       Start  tomorrow if already done today           Radiology Results (24 Hour)     Procedure Component Value Units Date/Time    XR Chest AP Portable [[id number]] Collected:  02/14/17 1402    Order Status:  Completed Updated:  02/14/17 1408    Narrative:       HISTORY: Shortness of breath.    COMMENT:  Frontal view of the chest was obtained and compared to 02/11/2017.    Mild basal atelectasis slightly increased on the left. Stable  cardiomegaly.      Impression:         Mild basal atelectasis slightly increased on the left.    [name] Cliff, MD   02/14/2017 2:04 PM             Imaging personally reviewed,  ECG, tele personally reviewed,           Assessment/ Plan     Patient Active Problem List   Diagnosis   . Chest pain possibly costochondritis    . Coronary artery disease   . HTN (hypertension)   . HLD (hyperlipidemia)   . Gout   . Nephrolithiasis   . History of pulmonary embolism   . Hypothyroid   . Obesity   . Depression   . GERD (gastroesophageal reflux disease)   . Anemia   .  OSA (obstructive sleep apnea)     [name] is a 67 y.o. female with PMH PE, GERD, CAD with multiple stents in the past, HLD, hypothyroidism. Transferred from [facility] to [facility] on 01/04 for further evaluation of chest pain. Additonally pt has presented R groin pain since previous cath 1 month ago.    - Hypoxemia when walking   Echo, BNP ordered  Lasix IV 40 mg today   CXR possible vascular congestion     - Chest pain - resolved   Cath 1 month ago, no need for further intervention at this time  CXR neg, VQ scan (pt allergic to contrast) - low suspicion for PE    - R groin pain - severe - possible neuropathy post cath - slowly improving   CT AP today showed no abnormalities   Duplex US showed no evidence of pseudoaneurysm   Started on oxy PRN + morphine for BTP + Flexeril      - Symptomatic bradycardia - improving   Cont Toprol 25 mg daily     - HTN,   Cont amlodipine, Imdur, Toprol     - CAD s/p stent 1 month ago  Cont ASA, Plavix, BB as above    -  Hypothyroidism  Cont  Synthroid, TSH WNL    - Depression  Cont home Celexa    - HLD  Cont high dose statin    - HX of gout  Cont allopurinol    - OSA, cont nocturnal 02 NC    GI PPX/ GERD: Protonix  DVT PPX: Heparin SQ  Dispo: home when pain and HR controlled, likely in am           Signed by: Eliezer Bottom, MD    [facility] contact:    Tyson Babinski [facility]:  [phone]      [facility]:    Tower NP (M-F 8am-5pm) [phone]  Tower MD (M-F 8am-5pm)  [phone]    [facility] NP (M-F 8am-5pm) [phone]  [facility] MD (M-F 8am-5pm) [phone]    24 hour answering service M-F 5pm-8am, Saturday and Sunday and if no answer on above numbers, [phone], please request attending physician covering patient that day    *This note was generated by the Epic EMR system/ Dragon speech recognition and may contain inherent errors or omissions not intended by the user. Grammatical errors, random word insertions, deletions, pronoun errors and incomplete sentences are occasional consequences of this technology due to software limitations. Not all errors are caught or corrected. If there are questions or concerns about the content of this note or information contained within the body of this dictation they should be addressed directly with the author for clarification.*

## 2017-02-14 NOTE — Progress Notes (Signed)
CM met with patient at bedside. Introduced role, provided contact information, and completed IDPA.     Pt's family will be available to provide transportation at discharge.       02/14/17 1806   Patient Type   Within 30 Days of Previous Admission? No   Healthcare Decisions   Interviewed: Patient   Orientation/Decision Making Abilities of Patient Alert and Oriented x3, able to make decisions   Advance Directive Patient does not have advance directive   Healthcare Agent Appointed Yes   Healthcare Agent's Name Enrique Sack, daughter   Healthcare Agent's Phone Number [phone]   Prior to admission   Prior level of function Independent with ADLs;Ambulates independently   Type of Residence Private residence   Home Layout Two level  (home physical address: [address] [place], [place] [address])   Have running water, electricity, heat, etc? Yes   Living Arrangements Alone   How do you get to your MD appointments? self   How do you get your groceries? self   Who fixes your meals? self   Who does your laundry? self   Who picks up your prescriptions? self   Dressing Independent   Grooming Independent   Feeding Independent   Bathing Independent   Toileting Independent   Prior SNF admission? (Detail) No   Prior Rehab admission? (Detail) Yes, [facility]   Adult Protective Services (APS) involved? No   Discharge Planning   Support Systems Family members;Children;Friends/neighbors   Patient expects to be discharged to: Home   Anticipated [place] plan discussed with: Same as interviewed   Mode of transportation: Private car (family member)   Consults/Providers   PT Evaluation Needed 2   OT Evalulation Needed 2   SLP Evaluation Needed 2   Outcome Palliative Care Screen Screened but did not meet criteria for intervention   Correct PCP listed in Epic? Yes   Important Message from [facility] Notice   Patient received 1st IMM Letter? Yes     Milana Kidney, RN MSN  Case Manager  [phone]

## 2017-02-15 ENCOUNTER — Inpatient Hospital Stay (HOSPITAL_BASED_OUTPATIENT_CLINIC_OR_DEPARTMENT_OTHER): Payer: Medicare Other

## 2017-02-15 DIAGNOSIS — R0602 Shortness of breath: Secondary | ICD-10-CM

## 2017-02-15 LAB — COMPREHENSIVE METABOLIC PANEL
ALT: 27 U/L (ref 0–55)
AST (SGOT): 23 U/L (ref 5–34)
Albumin/Globulin Ratio: 1.3 (ref 0.9–2.2)
Albumin: 3.6 g/dL (ref 3.5–5.0)
Alkaline Phosphatase: 61 U/L (ref 37–106)
BUN: 20 mg/dL — ABNORMAL HIGH (ref 7.0–19.0)
Bilirubin, Total: 0.5 mg/dL (ref 0.2–1.2)
CO2: 27 mEq/L (ref 22–29)
Calcium: 9.2 mg/dL (ref 8.5–10.5)
Chloride: 105 mEq/L (ref 100–111)
Creatinine: 0.8 mg/dL (ref 0.6–1.0)
Globulin: 2.7 g/dL (ref 2.0–3.6)
Glucose: 88 mg/dL (ref 70–100)
Potassium: 4.2 mEq/L (ref 3.5–5.1)
Protein, Total: 6.3 g/dL (ref 6.0–8.3)
Sodium: 141 mEq/L (ref 136–145)

## 2017-02-15 LAB — CBC
Absolute NRBC: 0 10*3/uL
Hematocrit: 35.1 % — ABNORMAL LOW (ref 37.0–47.0)
Hgb: 11.5 g/dL — ABNORMAL LOW (ref 12.0–16.0)
MCH: 31.6 pg (ref 28.0–32.0)
MCHC: 32.8 g/dL (ref 32.0–36.0)
MCV: 96.4 fL (ref 80.0–100.0)
MPV: 11.5 fL (ref 9.4–12.3)
Nucleated RBC: 0 /100 WBC (ref 0.0–1.0)
Platelets: 144 10*3/uL (ref 140–400)
RBC: 3.64 10*6/uL — ABNORMAL LOW (ref 4.20–5.40)
RDW: 13 % (ref 12–15)
WBC: 4.23 10*3/uL (ref 3.50–10.80)

## 2017-02-15 LAB — GFR: EGFR: >60

## 2017-02-15 MED ORDER — METOPROLOL SUCCINATE ER 25 MG PO TB24
12.50 mg | ORAL_TABLET | Freq: Every day | ORAL | Status: DC
Start: 2017-02-15 — End: 2017-02-17
  Administered 2017-02-15 – 2017-02-17 (×3): 12.5 mg via ORAL
  Filled 2017-02-15 (×2): qty 1

## 2017-02-15 MED ORDER — METOPROLOL SUCCINATE ER 25 MG PO TB24
12.50 mg | ORAL_TABLET | Freq: Every day | ORAL | Status: DC
Start: 2017-02-16 — End: 2017-02-15

## 2017-02-15 MED ORDER — ALBUTEROL-IPRATROPIUM 2.5-0.5 (3) MG/3ML IN SOLN
3.00 mL | RESPIRATORY_TRACT | Status: DC
Start: 2017-02-16 — End: 2017-02-16
  Filled 2017-02-15: qty 3

## 2017-02-15 MED ORDER — ALBUTEROL-IPRATROPIUM 2.5-0.5 (3) MG/3ML IN SOLN
3.00 mL | RESPIRATORY_TRACT | Status: DC | PRN
Start: 2017-02-15 — End: 2017-02-17

## 2017-02-15 MED ORDER — ALBUTEROL-IPRATROPIUM 2.5-0.5 (3) MG/3ML IN SOLN
3.00 mL | RESPIRATORY_TRACT | Status: DC
Start: 2017-02-15 — End: 2017-02-15
  Administered 2017-02-15 (×3): 3 mL via RESPIRATORY_TRACT
  Filled 2017-02-15 (×3): qty 3

## 2017-02-15 NOTE — Progress Notes (Signed)
Home Health Referral          Referral from Milana Kidney (Case Manager) for home health care upon discharge.    By [employer], the patient has the right to freely choose a home care provider.  Arrangements have been made with:     A company of the patients choosing. We have supplied the patient with a listing of providers in your area who asked to be included and participate in Medicare.   [facility], formerly [facility], a home care agency that provides adult home care services and participates in Medicare   The preferred provider of your insurance company. Choosing a home care provider other than your insurance company's preferred provider may affect your insurance coverage.      Home Health Discharge Information     Your doctor has ordered Other Home oxygen in-home service(s) for you while you recuperate at home, to assist you in the transition from hospital to home.      [employer]:  Name of DME Agency: [employer] / [employer] / [employer] / [facility] Intake [phone])]  Equipment Ordered: Home oxygen      The above services were set up by:  Harvie Junior Kindra Bickham  [facility] Liaison)   Phone [phone]    Additional comments: Patient is a ROC with [employer] for home O2 with no changes in baseline requirements.  Met with patient in hospital room. Discussed home health services recommended. Patient declined services. Milana Kidney, case manager, notified via TC.        Signed by: Harvie Junior Axle Parfait  Date Time: 02/15/17 2:26 PM

## 2017-02-15 NOTE — Discharge Instr - AVS First Page (Signed)
Home Health Discharge Information     Your doctor has ordered Other Home oxygen in-home service(s) for you while you recuperate at home, to assist you in the transition from hospital to home.      [employer]:  Name of DME Agency: [employer] / [employer] / [employer] / [facility] Intake ([phone])]  Equipment Ordered: Home oxygen      The above services were set up by:  Harvie Junior Brittanee Ghazarian  [facility] Liaison)   Phone [phone]

## 2017-02-15 NOTE — Progress Notes (Signed)
02/15/17 1945   Patient Assessment   Status Completed   RT Priority 2   Pulmonary History Gerd/GI   Mobility Ambulatory with or without assistance   Heart Rate 73   Bilateral Breath Sounds Diminished   Home regimen   Home Treatments n   Home Oxygen y   Home CPAP/BiLevel n   Oxygen Therapy   O2 Device None (Room air)   Criteria for Therapy   Secretion Clearance (BPH) None   Lung Expansion None   Oxygen Mild/Moderate: O2 < or equal to 5 lpm NC to maintain SpO2> or equal to 90%   Severity Index   Severity Index MILD   Outcomes   Respiratory medication No change or improvement   Oxygen goals Maintain room air SpO2 > 90%   Recommendations   Respiratory Medications Decrease therapy   Performing Departments   O2 Device performing department formula [id number]   Resp assess adult performing department formula [id number]   Changed nebulizer to Q4HWA and Q4H PRN per PDP.

## 2017-02-15 NOTE — Progress Notes (Signed)
[name], [name]  Answering service [phone]  Progress Note      Date Time: 02/15/17 7:25 PM  Patient Name: [name]  Attending Physician: [name], Mar*      Subjective:   Patient Seen and Examined. The notes from the last 24 hours were reviewed. Discussed with patient and nursing at length.    Remains bradycardic to 50s with lightheadedness. R groin pain improved.     Review of Systems:    Aside from above, No headache. No Eyes, Ear, Nose, Throat problem. No chest pain, orthopnea or shortness or breath or cough, No abdominal pain, no nausea or vomiting. No pain or weakness in upper or lower extremity, no psychiatric, neurological, endocrine or hematological complaints other than above.        Physical Exam:     Vitals:    02/15/17 1751   BP:    Pulse: 65   Resp:    Temp:    SpO2:        Intake and Output Summary (Last 24 hours) at Date Time    Intake/Output Summary (Last 24 hours) at 02/15/17 1925  Last data filed at 02/15/17 0848   Gross per 24 hour   Intake                0 ml   Output             1100 ml   Net            -1100 ml       General: Awake, no acute distress.  HEENT: Normocephalic. No icter or congestion  Neck: supple,   Cardiovascular: S1, S2 regular rhythm and rate, no murmurs, rubs or gallops  Lungs: decreased breath sounds on both bases   Abdomen: soft, tender to palpation on RLQ and R groin, no rebound, non-distended; no palpable masses. R groin no masses felt   Extremities: no clubbing, cyanosis, or edema  Neuro: Exam grossly  non focal  Skin: no rashes or lesions noted      Meds:     Scheduled Meds:  Current Facility-Administered Medications   Medication Dose Route Frequency   . albuterol-ipratropium  3 mL Nebulization Q4H SCH   . allopurinol  300 mg Oral QHS   . amLODIPine  10 mg Oral Daily   . aspirin  81 mg Oral Daily   . atorvastatin  80 mg Oral Daily   . citalopram  40 mg Oral Daily   . clopidogrel  75 mg Oral QHS   . cyclobenzaprine  5 mg  Oral Q12H SCH   . ferrous sulfate  324 mg Oral Daily   . heparin (porcine)  5,000 Units Subcutaneous Q8H SCH   . isosorbide mononitrate  30 mg Oral Daily   . levothyroxine  75 mcg Oral Daily at 0600   . metoprolol succinate XL  12.5 mg Oral Daily   . montelukast  10 mg Oral QHS   . pantoprazole  40 mg Oral QAM AC   . vitamin D (ergocalciferol)  50,000 Units Oral Weekly     Continuous Infusions:  PRN Meds:.acetaminophen **OR** acetaminophen, naloxone, ondansetron **OR** ondansetron, oxyCODONE, polyethylene glycol, traZODone  I personally reviewed all of the medications        Labs:     Results     Procedure Component Value Units Date/Time    Comprehensive metabolic panel [[id number]]  (Abnormal) Collected:  02/15/17 0416    Specimen:  Blood  Updated:  02/15/17 0527     Glucose 88 mg/dL      BUN 16.1 (H) mg/dL      Creatinine 0.8 mg/dL      Sodium 096 mEq/L      Potassium 4.2 mEq/L      Chloride 105 mEq/L      CO2 27 mEq/L      Calcium 9.2 mg/dL      Protein, Total 6.3 g/dL      Albumin 3.6 g/dL      AST (SGOT) 23 U/L      ALT 27 U/L      Alkaline Phosphatase 61 U/L      Bilirubin, Total 0.5 mg/dL      Globulin 2.7 g/dL      Albumin/Globulin Ratio 1.3    Narrative:       Start tomorrow if already done today    GFR [[id number]] Collected:  02/15/17 0416     Updated:  02/15/17 0527     EGFR >60.0    Narrative:       Start tomorrow if already done today    CBC without differential [[id number]]  (Abnormal) Collected:  02/15/17 0416    Specimen:  Blood from Blood Updated:  02/15/17 0510     WBC 4.23 x10 3/uL      Hgb 11.5 (L) g/dL      Hematocrit 21.3 (L) %      Platelets 144 x10 3/uL      RBC 3.64 (L) x10 6/uL      MCV 96.4 fL      MCH 31.6 pg      MCHC 32.8 g/dL      RDW 13 %      MPV 11.5 fL      Nucleated RBC 0.0 /100 WBC      Absolute NRBC 0.00 x10 3/uL     Narrative:       Start tomorrow if already done today           Radiology Results (24 Hour)     ** No results found for the last 24 hours. **             Imaging  personally reviewed,  ECG, tele personally reviewed,           Assessment/ Plan     Patient Active Problem List   Diagnosis   . Chest pain possibly costochondritis    . Coronary artery disease   . HTN (hypertension)   . HLD (hyperlipidemia)   . Gout   . Nephrolithiasis   . History of pulmonary embolism   . Hypothyroid   . Obesity   . Depression   . GERD (gastroesophageal reflux disease)   . Anemia   . OSA (obstructive sleep apnea)     [name] is a 67 y.o. female with PMH PE, GERD, CAD with multiple stents in the past, HLD, hypothyroidism. Transferred from [facility] to [facility] on 01/04 for further evaluation of chest pain. Additonally pt has presented R groin pain since previous cath 1 month ago.    - Symptomatic bradycardia - persistent   Cont decreased to Toprol 12.5 mg daily today     - Hypoxemia when walking   Echo on 1/8 normal EF 69%, no diastolic dysfunction, BNP WNL  S/p Lasix IV 40 mg on 1/7    CXR possible vascular congestion     - Chest pain - resolved   Cath 1 month ago, no need  for further intervention at this time  CXR neg, VQ scan (pt allergic to contrast) - low suspicion for PE    - R groin pain - severe - possible neuropathy post cath - slowly improving   CT AP today showed no abnormalities   Duplex US showed no evidence of pseudoaneurysm   Started on oxy PRN + morphine for BTP + Flexeril      - HTN,   Cont amlodipine, Imdur, Toprol     - CAD s/p stent 1 month ago  Cont ASA, Plavix, BB as above    - Hypothyroidism  Cont  Synthroid, TSH WNL    - Depression  Cont home Celexa    - HLD  Cont high dose statin    - HX of gout  Cont allopurinol    - OSA, cont nocturnal 02 NC    GI PPX/ GERD: Protonix  DVT PPX: Heparin SQ  Dispo: home when pain and HR controlled, likely in am           Signed by: [name] Bottom, MD    [facility] contact:    [name]:  [phone]      [name]:    [name] (M-F 8am-5pm) [phone]  Tower MD (M-F  8am-5pm)  [phone]    [facility] Weeks (M-F 8am-5pm) [phone]  [facility] MD (M-F 8am-5pm) [phone]    24 hour answering service M-F 5pm-8am, Saturday and Sunday and if no answer on above numbers, [phone], please request attending physician covering patient that day    *This note was generated by the Epic EMR system/ Dragon speech recognition and may contain inherent errors or omissions not intended by the user. Grammatical errors, random word insertions, deletions, pronoun errors and incomplete sentences are occasional consequences of this technology due to software limitations. Not all errors are caught or corrected. If there are questions or concerns about the content of this note or information contained within the body of this dictation they should be addressed directly with the author for clarification.*

## 2017-02-15 NOTE — Plan of Care (Signed)
Problem: Safety  Goal: Patient will be free from injury during hospitalization  Outcome: Progressing   02/15/17 0136   Goal/Interventions addressed this shift   Patient will be free from injury during hospitalization  Assess patient's risk for falls and implement fall prevention plan of care per policy;Provide and maintain safe environment;Ensure appropriate safety devices are available at the bedside;Hourly rounding;Use appropriate transfer methods;Provide alternative method of communication if needed (communication boards, writing)       Problem: Hemodynamic Status: Cardiac  Goal: Stable vital signs and fluid balance  Outcome: Progressing   02/15/17 0136   Goal/Interventions addressed this shift   Stable vital signs and fluid balance Monitor/assess vital signs and telemetry per unit protocol;Weigh on admission and record weight daily;Assess signs and symptoms associated with cardiac rhythm changes;Monitor lab values;Monitor for leg swelling/edema and report to LIP if abnormal;Monitor intake/output per unit protocol and/or LIP order       Problem: Ineffective Gas Exchange  Goal: Effective breathing pattern  Outcome: Progressing   02/15/17 0136   Goal/Interventions addressed this shift   Effective breathing pattern Maintain O2 saturation level per LIP order;Monitor for medication induced respiratory depression;Maintain CO2 level per LIP order       Comments: Orientation:  A&O x4   Rhythm on tele: Sinus Brady (59's)   Respiratory: 3L NC  Ambulation: independent  Pain: Right groin pain   Lines/Drips: 22G R AC   GI/GU: ambulates to restroom     Critical Lab/Imaging/Procedures:     Comments:   - see flowsheet for complete assessment and vitals   - endorses pain at right groin. States it is getting "better" but still very persistent. PRN oxycodone given before bedtime  - orthostatics taken. See vital assessment.     Plan:   - ECHO  - Monitor oxygen saturations  - Pain management     Call bell within reach and belongings  at bedside. Fall, bleeding and safety precautions in place. Will continue to monitor.

## 2017-02-15 NOTE — Plan of Care (Addendum)
Problem: Moderate/High Fall Risk Score >5  Goal: Patient will remain free of falls  Outcome: Progressing      Problem: Hemodynamic Status: Cardiac  Goal: Stable vital signs and fluid balance  Outcome: Progressing      Comments: [name] AOx4, Vitals stable except for SB at times, SB-SR on tele, sheis saturating in the mid 90s on room air. Patient still reports of pain at her groin site, open to air/clean/dry. No redness. Pain mild-moderately controlled by tylenol and oxycodone.    Sinus Brady-SR, HR in the 50s   -reports of having dizziness during the morning    -metoprolol xl decreased from 25 to 12.5mg     Pt uses 2L NC at home for bedtime usage.     Fall mat in place, bed alarm on, call bell within reach.

## 2017-02-16 LAB — COMPREHENSIVE METABOLIC PANEL
ALT: 33 U/L (ref 0–55)
AST (SGOT): 28 U/L (ref 5–34)
Albumin/Globulin Ratio: 1.3 (ref 0.9–2.2)
Albumin: 3.6 g/dL (ref 3.5–5.0)
Alkaline Phosphatase: 61 U/L (ref 37–106)
BUN: 18 mg/dL (ref 7.0–19.0)
Bilirubin, Total: 0.4 mg/dL (ref 0.2–1.2)
CO2: 25 mEq/L (ref 22–29)
Calcium: 9 mg/dL (ref 8.5–10.5)
Chloride: 106 mEq/L (ref 100–111)
Creatinine: 0.7 mg/dL (ref 0.6–1.0)
Globulin: 2.8 g/dL (ref 2.0–3.6)
Glucose: 94 mg/dL (ref 70–100)
Potassium: 3.9 mEq/L (ref 3.5–5.1)
Protein, Total: 6.4 g/dL (ref 6.0–8.3)
Sodium: 141 mEq/L (ref 136–145)

## 2017-02-16 LAB — CBC
Absolute NRBC: 0 10*3/uL
Hematocrit: 35.6 % — ABNORMAL LOW (ref 37.0–47.0)
Hgb: 11.7 g/dL — ABNORMAL LOW (ref 12.0–16.0)
MCH: 31.3 pg (ref 28.0–32.0)
MCHC: 32.9 g/dL (ref 32.0–36.0)
MCV: 95.2 fL (ref 80.0–100.0)
MPV: 11.2 fL (ref 9.4–12.3)
Nucleated RBC: 0 /100 WBC (ref 0.0–1.0)
Platelets: 138 10*3/uL — ABNORMAL LOW (ref 140–400)
RBC: 3.74 10*6/uL — ABNORMAL LOW (ref 4.20–5.40)
RDW: 13 % (ref 12–15)
WBC: 4.17 10*3/uL (ref 3.50–10.80)

## 2017-02-16 LAB — GFR: EGFR: >60

## 2017-02-16 MED ORDER — SODIUM CHLORIDE 0.9 % IV SOLN
Freq: Once | INTRAVENOUS | Status: AC
Start: 2017-02-16 — End: 2017-02-17

## 2017-02-16 MED ORDER — ALBUTEROL-IPRATROPIUM 2.5-0.5 (3) MG/3ML IN SOLN
3.00 mL | RESPIRATORY_TRACT | Status: DC | PRN
Start: 2017-02-16 — End: 2017-02-17

## 2017-02-16 NOTE — Plan of Care (Incomplete Revision)
Problem: Safety  Goal: Patient will be free from injury during hospitalization  Outcome: Progressing   02/15/17 0136   Goal/Interventions addressed this shift   Patient will be free from injury during hospitalization  Assess patient's risk for falls and implement fall prevention plan of care per policy;Provide and maintain safe environment;Ensure appropriate safety devices are available at the bedside;Hourly rounding;Use appropriate transfer methods;Provide alternative method of communication if needed (communication boards, writing)       Problem: Hemodynamic Status: Cardiac  Goal: Stable vital signs and fluid balance  Outcome: Progressing   02/16/17 1610   Goal/Interventions addressed this shift   Stable vital signs and fluid balance Monitor/assess vital signs and telemetry per unit protocol;Assess signs and symptoms associated with cardiac rhythm changes;Monitor intake/output per unit protocol and/or LIP order;Monitor for leg swelling/edema and report to LIP if abnormal       Pt is alert and oriented x4, VSS, sating in mid 90s on 2L NC, NSR on tele. No dizziness, some SOB w/exertion.     Still some pain in groin at site of cath insertion site, pt reports relief with PRN oxycodone and tylenol

## 2017-02-16 NOTE — Progress Notes (Signed)
[facility], [facility]  Answering service [phone]  Progress Note      Date Time: 02/16/17 2:43 PM  Patient Name: [name]  Attending Physician: Karma Ganja, MD      Subjective:   Patient Seen and Examined. The notes from the last 24 hours were reviewed. Discussed with patient and nursing at length.    Was dizzy earlier today, ask nursing staff to check orthostatic blood pressure changes and should drop by about 20 point    Review of Systems:    Aside from above, No headache. No Eyes, Ear, Nose, Throat problem. No chest pain, orthopnea or shortness or breath or cough, No abdominal pain, no nausea or vomiting. No pain or weakness in upper or lower extremity, no psychiatric, neurological, endocrine or hematological complaints other than above.        Physical Exam:     Vitals:    02/16/17 1157   BP: 133/67   Pulse: 75   Resp:    Temp:    SpO2:        Intake and Output Summary (Last 24 hours) at Date Time    Intake/Output Summary (Last 24 hours) at 02/16/17 1443  Last data filed at 02/16/17 1300   Gross per 24 hour   Intake              668 ml   Output              700 ml   Net              -32 ml       General: Awake, no acute distress.  HEENT: Normocephalic. No icter or congestion  Neck: supple,   Cardiovascular: S1, S2 regular rhythm and rate, no murmurs, rubs or gallops  Lungs: decreased breath sounds on both bases   Abdomen: soft, tender to palpation on RLQ and R groin, no rebound, non-distended; no palpable masses. R groin no masses felt   Extremities: no clubbing, cyanosis, or edema  Neuro: Exam grossly  non focal  Skin: no rashes or lesions noted      Meds:     Scheduled Meds:  Current Facility-Administered Medications   Medication Dose Route Frequency   . sodium chloride   Intravenous Once   . allopurinol  300 mg Oral QHS   . amLODIPine  10 mg Oral Daily   . aspirin  81 mg Oral Daily   . atorvastatin  80 mg Oral Daily   . citalopram  40 mg Oral Daily   . clopidogrel  75 mg  Oral QHS   . cyclobenzaprine  5 mg Oral Q12H SCH   . ferrous sulfate  324 mg Oral Daily   . heparin (porcine)  5,000 Units Subcutaneous Q8H SCH   . isosorbide mononitrate  30 mg Oral Daily   . levothyroxine  75 mcg Oral Daily at 0600   . metoprolol succinate XL  12.5 mg Oral Daily   . montelukast  10 mg Oral QHS   . pantoprazole  40 mg Oral QAM AC   . vitamin D (ergocalciferol)  50,000 Units Oral Weekly     Continuous Infusions:  PRN Meds:.acetaminophen **OR** acetaminophen, albuterol-ipratropium, albuterol-ipratropium, naloxone, ondansetron **OR** ondansetron, oxyCODONE, polyethylene glycol, traZODone  I personally reviewed all of the medications        Labs:     Results     Procedure Component Value Units Date/Time    CBC without differential [[id number]]  (Abnormal)  Collected:  02/16/17 0558    Specimen:  Blood from Blood Updated:  02/16/17 0821     WBC 4.17 x10 3/uL      Hgb 11.7 (L) g/dL      Hematocrit 16.1 (L) %      Platelets 138 (L) x10 3/uL      RBC 3.74 (L) x10 6/uL      MCV 95.2 fL      MCH 31.3 pg      MCHC 32.9 g/dL      RDW 13 %      MPV 11.2 fL      Nucleated RBC 0.0 /100 WBC      Absolute NRBC 0.00 x10 3/uL     Narrative:       Start tomorrow if already done today    Comprehensive metabolic panel [[id number]] Collected:  02/16/17 0558    Specimen:  Blood Updated:  02/16/17 0651     Glucose 94 mg/dL      BUN 81.1 mg/dL      Creatinine 0.7 mg/dL      Sodium 914 mEq/L      Potassium 3.9 mEq/L      Chloride 106 mEq/L      CO2 25 mEq/L      Calcium 9.0 mg/dL      Protein, Total 6.4 g/dL      Albumin 3.6 g/dL      AST (SGOT) 28 U/L      ALT 33 U/L      Alkaline Phosphatase 61 U/L      Bilirubin, Total 0.4 mg/dL      Globulin 2.8 g/dL      Albumin/Globulin Ratio 1.3    Narrative:       Start tomorrow if already done today    GFR [[id number]] Collected:  02/16/17 0558     Updated:  02/16/17 0651     EGFR >60.0    Narrative:       Start tomorrow if already done today           Radiology Results (24 Hour)     **  No results found for the last 24 hours. **             Imaging personally reviewed,  ECG, tele personally reviewed,           Assessment/ Plan     Patient Active Problem List   Diagnosis   . Chest pain possibly costochondritis    . Coronary artery disease   . HTN (hypertension)   . HLD (hyperlipidemia)   . Gout   . Nephrolithiasis   . History of pulmonary embolism   . Hypothyroid   . Obesity   . Depression   . GERD (gastroesophageal reflux disease)   . Anemia   . OSA (obstructive sleep apnea)     [name] is a 67 y.o. female with PMH PE, GERD, CAD with multiple stents in the past, HLD, hypothyroidism. Transferred from [facility] to [facility] on 01/04 for further evaluation of chest pain. Additonally pt has presented R groin pain since previous cath 1 month ago.    - Symptomatic bradycardia - persistent   Cont decreased to Toprol 12.5 mg daily today     Dizziness is due to orthostatic hypotension  Place on IV fluid    - Hypoxemia when walking   Echo on 1/8 normal EF 69%, no diastolic dysfunction, BNP WNL  S/p Lasix IV 40 mg on 1/7  CXR possible vascular congestion   Was already on home oxygen at 2 L at night  History of obstructive sleep apnea    - Chest pain - resolved   Cath 1 month ago, no need for further intervention at this time  CXR neg, VQ scan (pt allergic to contrast) - low suspicion for PE    - R groin pain - severe - possible neuropathy post cath - slowly improving   CT AP today showed no abnormalities   Duplex US showed no evidence of pseudoaneurysm   Started on oxy PRN + morphine for BTP + Flexeril      - HTN,   Cont amlodipine, Imdur, Toprol     - CAD s/p stent 1 month ago  Cont ASA, Plavix, BB as above    - Hypothyroidism  Cont  Synthroid, TSH WNL    - Depression  Cont home Celexa    - HLD  Cont high dose statin    - HX of gout  Cont allopurinol    - OSA, cont nocturnal 02 NC    GI PPX/ GERD: Protonix  DVT PPX: Heparin SQ  Dispo: home when pain and HR controlled, likely in am           Signed by:  Melene Muller, MD    [facility] contact:    Tyson Babinski [facility]:  [phone]      [facility]:    Tower NP (M-F 8am-5pm) [phone]  Tower MD (M-F 8am-5pm)  [phone]    [facility] NP (M-F 8am-5pm) [phone]  [facility] MD (M-F 8am-5pm) [phone]    24 hour answering service M-F 5pm-8am, Saturday and Sunday and if no answer on above numbers, [phone], please request attending physician covering patient that day    *This note was generated by the Epic EMR system/ Dragon speech recognition and may contain inherent errors or omissions not intended by the user. Grammatical errors, random word insertions, deletions, pronoun errors and incomplete sentences are occasional consequences of this technology due to software limitations. Not all errors are caught or corrected. If there are questions or concerns about the content of this note or information contained within the body of this dictation they should be addressed directly with the author for clarification.*

## 2017-02-16 NOTE — Plan of Care (Addendum)
Problem: Safety  Goal: Patient will be free from injury during hospitalization  Outcome: Progressing   02/15/17 0136   Goal/Interventions addressed this shift   Patient will be free from injury during hospitalization  Assess patient's risk for falls and implement fall prevention plan of care per policy;Provide and maintain safe environment;Ensure appropriate safety devices are available at the bedside;Hourly rounding;Use appropriate transfer methods;Provide alternative method of communication if needed (communication boards, writing)       Problem: Hemodynamic Status: Cardiac  Goal: Stable vital signs and fluid balance  Outcome: Progressing   02/16/17 0981   Goal/Interventions addressed this shift   Stable vital signs and fluid balance Monitor/assess vital signs and telemetry per unit protocol;Assess signs and symptoms associated with cardiac rhythm changes;Monitor intake/output per unit protocol and/or LIP order;Monitor for leg swelling/edema and report to LIP if abnormal       Pt is alert and oriented x4, VSS, sating in mid 90s on 2L NC,     Still some pain in groin at site of cath insertion site, pt reports relief with PRN oxycodone and tylenol

## 2017-02-16 NOTE — Plan of Care (Signed)
Problem: Hemodynamic Status: Cardiac  Goal: Stable vital signs and fluid balance  Outcome: Progressing   02/16/17 1610   Goal/Interventions addressed this shift   Stable vital signs and fluid balance Monitor/assess vital signs and telemetry per unit protocol;Assess signs and symptoms associated with cardiac rhythm changes;Monitor intake/output per unit protocol and/or LIP order;Monitor for leg swelling/edema and report to LIP if abnormal       Problem: Impaired Mobility  Goal: Mobility/Activity is maintained at optimal level for patient  Outcome: Progressing   02/16/17 0628   Goal/Interventions addressed this shift   Mobility/activity is maintained at optimal level for patient Increase mobility as tolerated/progressive mobility;Perform active/passive ROM;Maintain proper body alignment;Reposition patient every 2 hours and as needed unless able to reposition self       Comments: Pt A&Ox4. VSS, SR/Sbrady on tele. On RA/ 2L NC at night.  C/o pain a groin it; Site open to air- no noticeable redness. Prn tylenol and oxy with some relief.   Denies SOB, dizziness and chest pain.   Ambulated 2 laps around the unit tonight.   Fall mat down, bed alarm on, call bell within reach.   Will continue to monitor.

## 2017-02-16 NOTE — Progress Notes (Signed)
CM continues to follow for d/c planning needs. CM was informed that pt is a likely d/c for later today.     Pt is on home O2 and family needs to bring that for transport to home. Pt declined other HHC services.     Yehuda Budd, LCSW  Case Management Department  [facility]  [phone]

## 2017-02-16 NOTE — Progress Notes (Addendum)
D/c checklist completed. MEDICARE FOCUS pt.    Pt will be d/c to home later today. CM called her PCP, Dr. Donald Siva, and scheduled follow up appointment for Friday, 02/18/2017 at 11:15am. Info written into AVS.    Also called [facility] and left VM with Arma Heading, [phone], asking for follow up. Pt declined HHC. She has Home O2 in place, but only uses it intermittently. Pt is currently on RA. Family transport to home.     Yehuda Budd, LCSW  Case Management Department  [facility]  [phone]

## 2017-02-17 LAB — COMPREHENSIVE METABOLIC PANEL
ALT: 43 U/L (ref 0–55)
AST (SGOT): 40 U/L — ABNORMAL HIGH (ref 5–34)
Albumin/Globulin Ratio: 1.4 (ref 0.9–2.2)
Albumin: 3.6 g/dL (ref 3.5–5.0)
Alkaline Phosphatase: 58 U/L (ref 37–106)
BUN: 15 mg/dL (ref 7.0–19.0)
Bilirubin, Total: 0.7 mg/dL (ref 0.2–1.2)
CO2: 23 mEq/L (ref 22–29)
Calcium: 8.9 mg/dL (ref 8.5–10.5)
Chloride: 106 mEq/L (ref 100–111)
Creatinine: 0.8 mg/dL (ref 0.6–1.0)
Globulin: 2.6 g/dL (ref 2.0–3.6)
Glucose: 90 mg/dL (ref 70–100)
Potassium: 4.4 mEq/L (ref 3.5–5.1)
Protein, Total: 6.2 g/dL (ref 6.0–8.3)
Sodium: 138 mEq/L (ref 136–145)

## 2017-02-17 LAB — GFR: EGFR: >60

## 2017-02-17 MED ORDER — OXYCODONE HCL 5 MG PO TABS
5.00 mg | ORAL_TABLET | ORAL | 0 refills | Status: AC | PRN
Start: 2017-02-17 — End: ?

## 2017-02-17 MED ORDER — METOPROLOL SUCCINATE ER 25 MG PO TB24
12.50 mg | ORAL_TABLET | Freq: Every day | ORAL | 0 refills | Status: DC
Start: 2017-02-18 — End: 2018-02-28

## 2017-02-17 NOTE — Discharge Summary (Signed)
[facility], [facility]  Answering service [phone]  Discharge Summary      Date Time: 02/17/17 7:46 PM  Patient Name: [name]  Attending Physician: No att. providers found  Primary Care Physician: Kizzie Ide, MD    Date of Admission:   02/11/2017    Date of Discharge:   02/17/2017    Discharge Dx:     Patient Active Problem List    Diagnosis Date Noted   . Chest pain possibly costochondritis  10/07/2012   . Coronary artery disease 10/07/2012   . HTN (hypertension) 10/07/2012   . HLD (hyperlipidemia) 10/07/2012   . Gout 10/07/2012   . Nephrolithiasis 10/07/2012   . History of pulmonary embolism 10/07/2012   . Hypothyroid 10/07/2012   . Obesity 10/07/2012   . Depression 10/07/2012   . GERD (gastroesophageal reflux disease) 10/07/2012   . Anemia 10/07/2012   . OSA (obstructive sleep apnea) 10/07/2012        Consultations:   Treatment Team: Consulting Physician: Thurnell Garbe, MD; Case Manager: Milana Kidney, RN; Registered Nurse: Judy Pimple, RN     Procedures/Radiology performed:       Results     Procedure Component Value Units Date/Time    Comprehensive metabolic panel [[id number]]  (Abnormal) Collected:  02/17/17 0542    Specimen:  Blood Updated:  02/17/17 0653     Glucose 90 mg/dL      BUN 82.9 mg/dL      Creatinine 0.8 mg/dL      Sodium 562 mEq/L      Potassium 4.4 mEq/L      Chloride 106 mEq/L      CO2 23 mEq/L      Calcium 8.9 mg/dL      Protein, Total 6.2 g/dL      Albumin 3.6 g/dL      AST (SGOT) 40 (H) U/L      ALT 43 U/L      Alkaline Phosphatase 58 U/L      Bilirubin, Total 0.7 mg/dL      Globulin 2.6 g/dL      Albumin/Globulin Ratio 1.4    GFR [[id number]] Collected:  02/17/17 0542     Updated:  02/17/17 0653     EGFR >60.0            Hospital Course:       Ms. Cotterman a 67 y.o.femalewith PMH PE, GERD, CAD with multiple stents in the past, HLD, hypothyroidism. Transferred from [facility] to [facility] on 01/04 for further evaluation of chest pain. Additonally pt has presented  R groin pain since previous cath 1 month ago.    - Symptomatic bradycardia - persistent   Cont decreased to Toprol 12.5 mg daily today     Dizziness is due to orthostatic hypotension  Place on IV fluid    - Hypoxemia when walking   Echo on 1/8 normal EF 69%, no diastolic dysfunction, BNP WNL  S/p Lasix IV 40 mg on 1/7    CXR possible vascular congestion   Was already on home oxygen at 2 L at night  History of obstructive sleep apnea    - Chest pain - resolved   Cath 1 month ago, no need for further intervention at this time  CXR neg, VQ scan (pt allergic to contrast) - low suspicion for PE    - R groin pain - severe - possible neuropathy - slowly improving   CT AP today showed no abnormalities   Duplex US  showed no evidence of pseudoaneurysm   Started on oxy PRN + morphine for BTP + Flexeril      - HTN,   Cont amlodipine, Imdur, Toprol     - CAD s/p stent 1 month ago  Cont ASA, Plavix, BB as above    - Hypothyroidism  Cont  Synthroid, TSH WNL    - Depression  Cont home Celexa    - HLD  Cont high dose statin    - HX of gout  Cont allopurinol    - OSA, cont nocturnal 02 NC    GI PPX/ GERD: Protonix  DVT PPX: Heparin SQ  Dispo: home when pain and HR controlled, likely in am               DISCHARGE DAY EXAM:  Vitals:    02/17/17 1100   BP: 160/64   Pulse:    Resp: 18   Temp: 98.4 F (36.9 C)   SpO2: 94%     General: awake, alert, oriented x 3; no acute distress.  Cardiovascular: regular rate and rhythm, no murmurs, rubs or gallops  Lungs: clear to auscultation bilaterally, without wheezing, rhonchi, or rales  Abdomen: soft, non-tender, non-distended; no palpable masses, no hepatosplenomegaly, normoactive bowel sounds, no rebound or guarding  Extremities: no clubbing, cyanosis, or edema  Other:     Discharge Medications:     Discharge Medication List as of 02/17/2017  2:34 PM      START taking these medications    Details   oxyCODONE (ROXICODONE) 5 MG immediate release tablet Take 1 tablet (5 mg total) by  mouth every 4 (four) hours as needed for Pain., Starting Thu 02/17/2017, Print         CONTINUE these medications which have CHANGED    Details   metoprolol succinate XL (TOPROL-XL) 25 MG 24 hr tablet Take 0.5 tablets (12.5 mg total) by mouth daily., Starting Fri 02/18/2017, Normal         CONTINUE these medications which have NOT CHANGED    Details   allopurinol (ZYLOPRIM) 300 MG tablet Take 300 mg by mouth nightly.   , Until Discontinued, Historical Med      amLODIPine (NORVASC) 10 MG tablet Take 10 mg by mouth daily., Starting Sun 05/16/2016, Historical Med      ASPIRIN LOW DOSE 81 MG EC tablet Take 81 mg by mouth daily., Starting Sat 05/15/2016, Historical Med      atorvastatin (LIPITOR) 80 MG tablet Take 80 mg by mouth daily., Starting Mon 08/16/2016, Historical Med      chlorthalidone (HYGROTEN) 25 MG tablet Take 25 mg by mouth every other day., Historical Med      citalopram (CELEXA) 40 MG tablet Take 40 mg by mouth daily., Until Discontinued, Historical Med      clopidogrel (PLAVIX) 75 mg tablet Take 75 mg by mouth nightly.   , Until Discontinued, Historical Med      ferrous sulfate 325 (65 FE) MG tablet Take 325 mg by mouth 2 (two) times daily.   , Starting 03/01/2015, Until Discontinued, Historical Med      isosorbide mononitrate (IMDUR) 30 MG 24 hr tablet Take 30 mg by mouth daily., Until Discontinued, Historical Med      levothyroxine (SYNTHROID, LEVOTHROID) 75 MCG tablet Take 75 mcg by mouth Once a day at 6:00am., Until Discontinued, Historical Med      montelukast (SINGULAIR) 10 MG tablet Take 10 mg by mouth nightly., Starting Sun 05/16/2016, Historical Med  pantoprazole (PROTONIX) 40 MG tablet Take 40 mg by mouth daily.    , Historical Med      PROAIR HFA 108 (90 Base) MCG/ACT inhaler 2 puffs every 6 (six) hours as needed.    , Starting Tue 02/18/2015, Historical Med               Discharge Instructions:       Disposition:  home    Patient was instructed to follow up with Primary Care Doctor Kizzie Ide, MD in 1 week and with cardiology in 1 week    Minutes spent coordinating discharge and reviewing discharge plan:More 30 minutes    [facility]  [phone]  Call  As needed for home oxygen needs    Donald Siva Ronda Fairly, MD  [address]  [place] [address]  [phone]    Follow up on 02/18/2017  Your follow up appointment is scheduled for Friday 02/18/17 at 11:15am.         Signed by: Melene Muller, MD    CC: Kizzie Ide, MD    *This note was generated by the Epic EMR system/ Dragon speech recognition and may contain inherent errors or omissions not intended by the user. Grammatical errors, random word insertions, deletions, pronoun errors and incomplete sentences are occasional consequences of this technology due to software limitations. Not all errors are caught or corrected. If there are questions or concerns about the content of this note or information contained within the body of this dictation they should be addressed directly with the author for clarification.*

## 2017-02-17 NOTE — Plan of Care (Signed)
Problem: Safety  Goal: Patient will be free from injury during hospitalization  Outcome: Progressing      Problem: Hemodynamic Status: Cardiac  Goal: Stable vital signs and fluid balance  Outcome: Progressing  Shift Note     Orientation: AOX4  Rhythm on tele: SB at start of shift now mid 60s  Oxygen/airway: RA most of the day, NC 2 Lpm when asleep sating >93%.  Pain: c/o pain on right groin to hip, PRN oxycodone given with good effect.  Lines/drips: PIV left AC 22G  GI/GU: continent x2 LBM 1/08  Ambulation: standby assist OOB  Fall Score: moderate fall risk.  Appropriately used call bell to address needs, call bell within reach. Bed and chair alarms on. Bed in lowest position.   Fall mats in place. Pt remained safe within the shift. Hourly rounding done. Will continue to monitor pt and strict I&Os.    Comments:   -NS done at midnight  -right groin OTA C/D/I    Plan:  -possible D/C today if HR stable and pain improves.  -pain mngt  -nocturnal O2 NC

## 2017-02-17 NOTE — Progress Notes (Addendum)
Pt. Ready for discharge to home. D/C orders in, removed IV, catheter tip intact, tele orders discontinued, went over discharge education, pt. Aware of plan. Prescriptions with pt. Will be leaving shortly.

## 2017-02-17 NOTE — Plan of Care (Addendum)
Problem: Pain  Goal: Pain at adequate level as identified by patient  Outcome: Progressing   02/17/17 0941   Goal/Interventions addressed this shift   Pain at adequate level as identified by patient Identify patient comfort function goal;Assess for risk of opioid induced respiratory depression, including snoring/sleep apnea. Alert healthcare team of risk factors identified.;Assess pain on admission, during daily assessment and/or before any "as needed" intervention(s);Evaluate patient's satisfaction with pain management progress;Offer non-pharmacological pain management interventions;Consult/collaborate with Pain Service;Include patient/patient care companion in decisions related to pain management as needed       Problem: Hemodynamic Status: Cardiac  Goal: Stable vital signs and fluid balance  Outcome: Progressing   02/17/17 0941   Goal/Interventions addressed this shift   Stable vital signs and fluid balance Monitor/assess vital signs and telemetry per unit protocol;Weigh on admission and record weight daily;Assess signs and symptoms associated with cardiac rhythm changes;Monitor intake/output per unit protocol and/or LIP order;Monitor lab values;Monitor for leg swelling/edema and report to LIP if abnormal       Comments: Shift Note     Orientation: A/Ox4  Rhythm on tele: SR-SB  Oxygen/airway: RA, wears 2L NC at night  Pain: c/o R groin to R hip pain. PRN Tylenol and PRN Oxy  Lines/drips: 22G LAC  GI/GU: continent x2,ambulates to restroom  Ambulation: stand by  Fall Score: moderate risk  Appropriately used call bell to address needs, call bell within reach. Bed and chair alarms on. Bed in lowest position. Fall mats in place. Pt remained safe within the shift. Hourly rounding done. Will continue to monitor pt and strict I&Os.    Plan:  -Monitor heart rate  -Pain management  -Possible D/C today

## 2017-02-17 NOTE — Progress Notes (Signed)
Transitional Care Management    Received voice mail from Chalmers Cater regarding this patient being Medicare Focus. After reviewing chart there is no Medicare Focus qualifying diagnosis for this patient that this CM can see. No mention of COPD/PNA/AMI or CHF.  Will not pick patient up for TCM admission.  Writer left message for Chalmers Cater regarding same.    Natasha Mead, LPC, CCM. NCC  Case Manager  [facility] Transitional Care Management   [phone]

## 2017-02-17 NOTE — Progress Notes (Signed)
TC from [place] # [id number] /lab to report that CBC that was drawn this morning did not have enough to run lab. Order canceled and will need to be re-daraw. Will notify bedside RN.

## 2017-02-25 ENCOUNTER — Telehealth: Payer: Self-pay

## 2017-02-25 NOTE — Telephone Encounter (Signed)
Discharge phone call attempted.  Unable to leave message due voice mailbox being full.    Army Melia Sameria Morss, RN, BSN  Patient Care Navigator, CHF   [facility]  [phone]

## 2017-07-27 DIAGNOSIS — Z8619 Personal history of other infectious and parasitic diseases: Secondary | ICD-10-CM | POA: Insufficient documentation

## 2017-09-20 DIAGNOSIS — K449 Diaphragmatic hernia without obstruction or gangrene: Secondary | ICD-10-CM

## 2017-09-20 HISTORY — DX: Diaphragmatic hernia without obstruction or gangrene: K44.9

## 2017-11-29 DIAGNOSIS — Z23 Encounter for immunization: Secondary | ICD-10-CM

## 2017-11-29 HISTORY — DX: Encounter for immunization: Z23

## 2017-12-09 DIAGNOSIS — R7303 Prediabetes: Secondary | ICD-10-CM

## 2017-12-09 HISTORY — DX: Prediabetes: R73.03

## 2017-12-30 DIAGNOSIS — I472 Ventricular tachycardia, unspecified: Secondary | ICD-10-CM

## 2017-12-30 HISTORY — DX: Ventricular tachycardia, unspecified: I47.20

## 2018-02-25 ENCOUNTER — Emergency Department: Payer: Medicare Other

## 2018-02-25 ENCOUNTER — Inpatient Hospital Stay
Admission: EM | Admit: 2018-02-25 | Discharge: 2018-02-28 | DRG: 303 | Disposition: A | Discharge status: Home / Self Care | Payer: Medicare Other | Attending: Internal Medicine | Admitting: Internal Medicine

## 2018-02-25 DIAGNOSIS — R001 Bradycardia, unspecified: Secondary | ICD-10-CM | POA: Diagnosis present

## 2018-02-25 DIAGNOSIS — Z7982 Long term (current) use of aspirin: Secondary | ICD-10-CM

## 2018-02-25 DIAGNOSIS — M549 Dorsalgia, unspecified: Secondary | ICD-10-CM | POA: Diagnosis present

## 2018-02-25 DIAGNOSIS — F419 Anxiety disorder, unspecified: Secondary | ICD-10-CM | POA: Diagnosis present

## 2018-02-25 DIAGNOSIS — Z79899 Other long term (current) drug therapy: Secondary | ICD-10-CM

## 2018-02-25 DIAGNOSIS — G4733 Obstructive sleep apnea (adult) (pediatric): Secondary | ICD-10-CM | POA: Diagnosis present

## 2018-02-25 DIAGNOSIS — I208 Other forms of angina pectoris: Secondary | ICD-10-CM

## 2018-02-25 DIAGNOSIS — Z79891 Long term (current) use of opiate analgesic: Secondary | ICD-10-CM

## 2018-02-25 DIAGNOSIS — G8929 Other chronic pain: Secondary | ICD-10-CM | POA: Diagnosis present

## 2018-02-25 DIAGNOSIS — I10 Essential (primary) hypertension: Secondary | ICD-10-CM | POA: Diagnosis present

## 2018-02-25 DIAGNOSIS — Z79890 Hormone replacement therapy (postmenopausal): Secondary | ICD-10-CM

## 2018-02-25 DIAGNOSIS — Z6841 Body Mass Index (BMI) 40.0 and over, adult: Secondary | ICD-10-CM

## 2018-02-25 DIAGNOSIS — R079 Chest pain, unspecified: Secondary | ICD-10-CM | POA: Diagnosis present

## 2018-02-25 DIAGNOSIS — I480 Paroxysmal atrial fibrillation: Secondary | ICD-10-CM | POA: Diagnosis present

## 2018-02-25 DIAGNOSIS — E039 Hypothyroidism, unspecified: Secondary | ICD-10-CM | POA: Diagnosis present

## 2018-02-25 DIAGNOSIS — Z86711 Personal history of pulmonary embolism: Secondary | ICD-10-CM

## 2018-02-25 DIAGNOSIS — M109 Gout, unspecified: Secondary | ICD-10-CM | POA: Diagnosis present

## 2018-02-25 DIAGNOSIS — I252 Old myocardial infarction: Secondary | ICD-10-CM

## 2018-02-25 DIAGNOSIS — Z955 Presence of coronary angioplasty implant and graft: Secondary | ICD-10-CM

## 2018-02-25 DIAGNOSIS — E785 Hyperlipidemia, unspecified: Secondary | ICD-10-CM | POA: Diagnosis present

## 2018-02-25 DIAGNOSIS — I25119 Atherosclerotic heart disease of native coronary artery with unspecified angina pectoris: Principal | ICD-10-CM | POA: Diagnosis present

## 2018-02-25 DIAGNOSIS — Z7902 Long term (current) use of antithrombotics/antiplatelets: Secondary | ICD-10-CM

## 2018-02-25 DIAGNOSIS — Z7901 Long term (current) use of anticoagulants: Secondary | ICD-10-CM

## 2018-02-25 DIAGNOSIS — K219 Gastro-esophageal reflux disease without esophagitis: Secondary | ICD-10-CM | POA: Diagnosis present

## 2018-02-25 LAB — ECG 12-LEAD
Atrial Rate: 49 {beats}/min
P Axis: 23 degrees
P-R Interval: 150 ms
Q-T Interval: 442 ms
QRS Duration: 82 ms
QTC Calculation (Bezet): 399 ms
R Axis: -4 degrees
T Axis: 5 degrees
Ventricular Rate: 49 {beats}/min

## 2018-02-25 LAB — PT AND APTT
PT INR: 1.8 — ABNORMAL HIGH (ref 0.9–1.1)
PT: 21.2 s — ABNORMAL HIGH (ref 12.6–15.0)
PTT: 42 s — ABNORMAL HIGH (ref 23–37)

## 2018-02-25 LAB — CBC AND DIFFERENTIAL
Absolute NRBC: 0 10*3/uL (ref 0.00–0.00)
Basophils Absolute Automated: 0.01 10*3/uL (ref 0.00–0.08)
Basophils Automated: 0.1 %
Eosinophils Absolute Automated: 0.1 10*3/uL (ref 0.00–0.44)
Eosinophils Automated: 1.4 %
Hematocrit: 37.2 % (ref 34.7–43.7)
Hgb: 12.1 g/dL (ref 11.4–14.8)
Immature Granulocytes Absolute: 0.02 10*3/uL (ref 0.00–0.07)
Immature Granulocytes: 0.3 %
Lymphocytes Absolute Automated: 1.6 10*3/uL (ref 0.42–3.22)
Lymphocytes Automated: 22.5 %
MCH: 31.3 pg (ref 25.1–33.5)
MCHC: 32.5 g/dL (ref 31.5–35.8)
MCV: 96.1 fL — ABNORMAL HIGH (ref 78.0–96.0)
MPV: 11.2 fL (ref 8.9–12.5)
Monocytes Absolute Automated: 0.75 10*3/uL (ref 0.21–0.85)
Monocytes: 10.6 %
Neutrophils Absolute: 4.62 10*3/uL (ref 1.10–6.33)
Neutrophils: 65.1 %
Nucleated RBC: 0 /100 WBC (ref 0.0–0.0)
Platelets: 170 10*3/uL (ref 142–346)
RBC: 3.87 10*6/uL — ABNORMAL LOW (ref 3.90–5.10)
RDW: 13 % (ref 11–15)
WBC: 7.1 10*3/uL (ref 3.10–9.50)

## 2018-02-25 LAB — COMPREHENSIVE METABOLIC PANEL
ALT: 28 U/L (ref 0–55)
AST (SGOT): 17 U/L (ref 5–34)
Albumin/Globulin Ratio: 1.4 (ref 0.9–2.2)
Albumin: 3.9 g/dL (ref 3.5–5.0)
Alkaline Phosphatase: 73 U/L (ref 37–106)
Anion Gap: 9 (ref 5.0–15.0)
BUN: 28.8 mg/dL — ABNORMAL HIGH (ref 7.0–19.0)
Bilirubin, Total: 0.7 mg/dL (ref 0.2–1.2)
CO2: 22 mEq/L (ref 22–29)
Calcium: 8.9 mg/dL (ref 8.5–10.5)
Chloride: 109 mEq/L (ref 100–111)
Creatinine: 0.9 mg/dL (ref 0.6–1.0)
Globulin: 2.7 g/dL (ref 2.0–3.6)
Glucose: 104 mg/dL — ABNORMAL HIGH (ref 70–100)
Potassium: 4.7 mEq/L (ref 3.5–5.1)
Protein, Total: 6.6 g/dL (ref 6.0–8.3)
Sodium: 140 mEq/L (ref 136–145)

## 2018-02-25 LAB — APTT
PTT: 39 s — ABNORMAL HIGH (ref 23–37)
PTT: 194 s (ref 23–37)

## 2018-02-25 LAB — TROPONIN I
Troponin I: 0.01 ng/mL (ref 0.00–0.05)
Troponin I: <0.01 ng/mL (ref 0.00–0.05)
Troponin I: <0.01 ng/mL (ref 0.00–0.05)

## 2018-02-25 LAB — IHS D-DIMER: D-Dimer: <0.27 ug/mL FEU (ref 0.00–0.70)

## 2018-02-25 LAB — B-TYPE NATRIURETIC PEPTIDE: B-Natriuretic Peptide: 42.8 pg/mL (ref 0.0–100.0)

## 2018-02-25 LAB — TSH: TSH: 0.96 u[IU]/mL (ref 0.35–4.94)

## 2018-02-25 LAB — CK: Creatine Kinase (CK): 25 U/L — ABNORMAL LOW (ref 29–168)

## 2018-02-25 LAB — GFR: EGFR: >60

## 2018-02-25 LAB — MAGNESIUM: Magnesium: 1.8 mg/dL (ref 1.6–2.6)

## 2018-02-25 MED ORDER — ATORVASTATIN CALCIUM 20 MG PO TABS
80.0000 mg | ORAL_TABLET | Freq: Every day | ORAL | Status: DC
Start: 2018-02-25 — End: 2018-02-28
  Administered 2018-02-25 – 2018-02-27 (×3): 80 mg via ORAL
  Filled 2018-02-25 (×3): qty 4

## 2018-02-25 MED ORDER — ONDANSETRON HCL 4 MG/2ML IJ SOLN
4.00 mg | Freq: Once | INTRAMUSCULAR | Status: AC
Start: 2018-02-25 — End: 2018-02-25
  Administered 2018-02-25: 13:00:00 4 mg via INTRAVENOUS
  Filled 2018-02-25: qty 2

## 2018-02-25 MED ORDER — ASPIRIN 81 MG PO CHEW
81.00 mg | CHEWABLE_TABLET | Freq: Every day | ORAL | Status: DC
Start: 2018-02-26 — End: 2018-02-26
  Filled 2018-02-25: qty 1

## 2018-02-25 MED ORDER — HEPARIN SODIUM (PORCINE) 5000 UNIT/ML IJ SOLN
3000.00 [IU] | INTRAMUSCULAR | Status: DC | PRN
Start: 2018-02-25 — End: 2018-02-26
  Administered 2018-02-25: 18:00:00 3000 [IU] via INTRAVENOUS
  Filled 2018-02-25: qty 1

## 2018-02-25 MED ORDER — SODIUM CHLORIDE 0.9 % IV SOLN
INTRAVENOUS | Status: DC
Start: 2018-02-25 — End: 2018-02-26

## 2018-02-25 MED ORDER — CITALOPRAM HYDROBROMIDE 20 MG PO TABS
40.0000 mg | ORAL_TABLET | Freq: Every day | ORAL | Status: DC
Start: 2018-02-25 — End: 2018-02-25
  Administered 2018-02-25: 21:00:00 40 mg via ORAL
  Filled 2018-02-25: qty 2

## 2018-02-25 MED ORDER — MONTELUKAST SODIUM 10 MG PO TABS
10.0000 mg | ORAL_TABLET | Freq: Every evening | ORAL | Status: DC
Start: 2018-02-25 — End: 2018-02-28
  Administered 2018-02-25 – 2018-02-27 (×3): 10 mg via ORAL
  Filled 2018-02-25 (×3): qty 1

## 2018-02-25 MED ORDER — CLOPIDOGREL BISULFATE 75 MG PO TABS
75.0000 mg | ORAL_TABLET | Freq: Every evening | ORAL | Status: DC
Start: 2018-02-25 — End: 2018-02-28
  Administered 2018-02-25 – 2018-02-27 (×3): 75 mg via ORAL
  Filled 2018-02-25 (×3): qty 1

## 2018-02-25 MED ORDER — NALOXONE HCL 0.4 MG/ML IJ SOLN (WRAP)
0.20 mg | INTRAMUSCULAR | Status: DC | PRN
Start: 2018-02-25 — End: 2018-02-28

## 2018-02-25 MED ORDER — MORPHINE SULFATE 2 MG/ML IJ/IV SOLN (WRAP)
2.0000 mg | Status: DC | PRN
Start: 2018-02-25 — End: 2018-02-27
  Administered 2018-02-25 – 2018-02-27 (×6): 2 mg via INTRAVENOUS
  Filled 2018-02-25 (×6): qty 1

## 2018-02-25 MED ORDER — NITROGLYCERIN 0.4 MG SL SUBL
0.4000 mg | SUBLINGUAL_TABLET | SUBLINGUAL | Status: DC | PRN
Start: 2018-02-25 — End: 2018-02-25
  Administered 2018-02-25 (×2): 0.4 mg via SUBLINGUAL
  Filled 2018-02-25: qty 1

## 2018-02-25 MED ORDER — NITROGLYCERIN 2 % TD OINT
0.50 [in_us] | TOPICAL_OINTMENT | TRANSDERMAL | Status: DC
Start: 2018-02-25 — End: 2018-02-26
  Administered 2018-02-25 – 2018-02-26 (×2): 0.5 [in_us] via TOPICAL
  Filled 2018-02-25 (×3): qty 1

## 2018-02-25 MED ORDER — PANTOPRAZOLE SODIUM 40 MG PO TBEC
40.00 mg | DELAYED_RELEASE_TABLET | Freq: Every morning | ORAL | Status: DC
Start: 2018-02-25 — End: 2018-02-28
  Administered 2018-02-26 – 2018-02-28 (×3): 40 mg via ORAL
  Filled 2018-02-25 (×3): qty 1

## 2018-02-25 MED ORDER — NITROGLYCERIN IN D5W 200-5 MCG/ML-% IV SOLN
10.00 ug/min | INTRAVENOUS | Status: DC
Start: 2018-02-25 — End: 2018-02-25

## 2018-02-25 MED ORDER — ONDANSETRON HCL 4 MG/2ML IJ SOLN
4.00 mg | Freq: Three times a day (TID) | INTRAMUSCULAR | Status: DC | PRN
Start: 2018-02-25 — End: 2018-02-28
  Administered 2018-02-25 – 2018-02-26 (×2): 4 mg via INTRAVENOUS
  Filled 2018-02-25 (×2): qty 2

## 2018-02-25 MED ORDER — LEVOTHYROXINE SODIUM 75 MCG PO TABS
75.0000 ug | ORAL_TABLET | Freq: Every day | ORAL | Status: DC
Start: 2018-02-26 — End: 2018-02-28
  Administered 2018-02-26 – 2018-02-28 (×3): 75 ug via ORAL
  Filled 2018-02-25 (×3): qty 1

## 2018-02-25 MED ORDER — ACETAMINOPHEN 650 MG RE SUPP
650.00 mg | RECTAL | Status: DC | PRN
Start: 2018-02-25 — End: 2018-02-28

## 2018-02-25 MED ORDER — MORPHINE SULFATE 10 MG/ML IJ/IV SOLN (WRAP)
8.0000 mg | Freq: Once | Status: AC
Start: 2018-02-25 — End: 2018-02-25
  Administered 2018-02-25: 8 mg via INTRAVENOUS
  Filled 2018-02-25: qty 1

## 2018-02-25 MED ORDER — RANOLAZINE ER 500 MG PO TB12
500.00 mg | ORAL_TABLET | Freq: Two times a day (BID) | ORAL | Status: DC
Start: 2018-02-25 — End: 2018-02-28
  Administered 2018-02-26 – 2018-02-28 (×5): 500 mg via ORAL
  Filled 2018-02-25 (×6): qty 1

## 2018-02-25 MED ORDER — ALLOPURINOL 100 MG PO TABS
300.0000 mg | ORAL_TABLET | Freq: Every evening | ORAL | Status: DC
Start: 2018-02-25 — End: 2018-02-28
  Administered 2018-02-26 – 2018-02-27 (×2): 300 mg via ORAL
  Filled 2018-02-25 (×2): qty 3

## 2018-02-25 MED ORDER — CITALOPRAM HYDROBROMIDE 20 MG PO TABS
20.0000 mg | ORAL_TABLET | Freq: Every day | ORAL | Status: DC
Start: 2018-02-26 — End: 2018-02-28
  Administered 2018-02-26 – 2018-02-27 (×2): 20 mg via ORAL
  Filled 2018-02-25 (×3): qty 1

## 2018-02-25 MED ORDER — HEPARIN (PORCINE) IN D5W 50-5 UNIT/ML-% IV SOLN (UNITS/KG/HR ONLY)
9.49 [IU]/kg/h | INTRAVENOUS | Status: DC
Start: 2018-02-25 — End: 2018-02-26
  Administered 2018-02-25 (×2): 9.49 [IU]/kg/h via INTRAVENOUS
  Administered 2018-02-26 (×2): 1.49 [IU]/kg/h via INTRAVENOUS
  Filled 2018-02-25: qty 500

## 2018-02-25 MED ORDER — ASPIRIN 81 MG PO CHEW
162.00 mg | CHEWABLE_TABLET | Freq: Every day | ORAL | Status: DC
Start: 2018-02-25 — End: 2018-02-25
  Administered 2018-02-25: 15:00:00 162 mg via ORAL
  Filled 2018-02-25: qty 2

## 2018-02-25 MED ORDER — ACETAMINOPHEN 325 MG PO TABS
650.0000 mg | ORAL_TABLET | ORAL | Status: DC | PRN
Start: 2018-02-25 — End: 2018-02-28
  Administered 2018-02-26 – 2018-02-27 (×3): 650 mg via ORAL
  Filled 2018-02-25 (×3): qty 2

## 2018-02-25 NOTE — ED Provider Notes (Signed)
EMERGENCY DEPARTMENT NOTE    Physician/Midlevel provider first contact with patient: 02/25/18 1224         Dr Karle Plumber  is the primary attending for this patient and has obtained and performed the history, PE, and medical decision making for this patient.    MEDICAL DECISION MAKING/ED COURSE   -->I personally reviewed Nursing Notes, vital signs and pulse oximetry;   -->I personally reviewed previous/outside records as available    -->I independently reviewed and interpreted the EKG:  "Cardiac Studies" below    -->Oxygen Saturation by Pulse Oximetry: 95%  On RA  Indicating normal oxygenation;  Interventions needed: None    -->Pt seen/examined on arrival to the ED;  --BP: 138/64 with HR-50  --Patient with recurrent chest pain  --Labs, EKG, etc.  --Nitroglycerin, morphine with improvement of her pain  --Patient is already on   Plavix, Xarelto    2:46 PM--Entire case d/w Hospitalist Vallabhaneni and will admit; Dr Amado Nash to consult;        MDM  Number of Diagnoses or Management Options  Chest pain, unspecified type: new, needed workup  Diagnosis management comments: Differential diagnosis to include but not limited to: CP: R/O  ACS, PE, dissection, pneumonia, pleuritis, costochondritis, GERD         Amount and/or Complexity of Data Reviewed  Clinical lab tests: ordered and reviewed  Tests in the radiology section of CPT: ordered and reviewed  Tests in the medicine section of CPT: ordered and reviewed  Discuss the patient with other providers: yes (Hospitalist)  Independent visualization of images, tracings, or specimens: yes    Risk of Complications, Morbidity, and/or Mortality  Presenting problems: high  Diagnostic procedures: high  Management options: high          Procedures            Diagnosis:  Final diagnoses:   Chest pain, unspecified type       Disposition:  ED Disposition     ED Disposition Condition Date/Time Comment    Observation  Sat Feb 25, 2018  4:47 PM Admitting Physician: Edwina Barth  [[id number]]   Diagnosis: Angina at rest [604540]   Estimated Length of Stay: < 2 midnights   Tentative Discharge Plan?: Home or Self Care [1]   Patient Class: Observation [104]            Prescriptions:  Current Discharge Medication List      CONTINUE these medications which have NOT CHANGED    Details   allopurinol (ZYLOPRIM) 300 MG tablet Take 300 mg by mouth every other day         amLODIPine (NORVASC) 10 MG tablet Take 10 mg by mouth daily.  Refills: 1      atorvastatin (LIPITOR) 80 MG tablet Take 80 mg by mouth daily.      chlorthalidone (HYGROTEN) 25 MG tablet Take 25 mg by mouth every other day.      citalopram (CELEXA) 40 MG tablet Take 40 mg by mouth daily.      clopidogrel (PLAVIX) 75 mg tablet Take 75 mg by mouth nightly.         isosorbide mononitrate (IMDUR) 30 MG 24 hr tablet Take 60 mg by mouth daily         levothyroxine (SYNTHROID, LEVOTHROID) 75 MCG tablet Take 75 mcg by mouth Once a day at 6:00am.      metoprolol succinate XL (TOPROL-XL) 25 MG 24 hr tablet Take 0.5 tablets (12.5 mg total) by mouth  daily.  Qty: 60 tablet, Refills: 0      montelukast (SINGULAIR) 10 MG tablet Take 10 mg by mouth nightly.  Refills: 3      oxyCODONE (ROXICODONE) 5 MG immediate release tablet Take 1 tablet (5 mg total) by mouth every 4 (four) hours as needed for Pain.  Qty: 30 tablet, Refills: 0      pantoprazole (PROTONIX) 40 MG tablet Take 40 mg by mouth daily.          PROAIR HFA 108 (90 Base) MCG/ACT inhaler 2 puffs every 6 (six) hours as needed.          rivaroxaban (XARELTO) 10 MG Tab Take by mouth daily with dinner                 HISTORY OF PRESENT ILLNESS     Translator Used: as per HPI    Chief Complaint: Chest Pain     Mechanism of Injury: as per HPI      [name] is a 68 y.o. female who presents to the ED with chest pain;   patient has a history of hypertension, hypercholesterolemia, PE to the left lung, anemia as well as CAD, multiple coronary stents.  Patient reports that she has not been feeling well  since last night.  Around 4 AM patient developed chest pain described as a mild uncomfortable feeling to the left chest with some radiation to the left neck/face and left arm/back.  Patient has had constant chest pain/tightness since 4 AM.  Also complains of mild nausea and shortness of breath.  No vomiting.  Feels that her heart rate is low in the 50's--- patient's baseline heart rate is 57-70.  No fevers or chills.  No cough or congestion.  No pleuritic pain or symptoms.  No lower extremity pain or swelling.  No abdominal pain.  Patient reports that this pain is similar to previous bouts of CAD     History obtained from: Patient;    Onset of Symptoms: 4 AM  Duration of Symptoms: Constant   Context: see above  Quality: Tightness  Location: Left chest  Radiation: As above  Severity: Moderate  Aggravating Factors: None  Alleviating Factors: None  Associated Symptoms: As above      Nursing notes & Vitals from this date of service were reviewed.        ALL:   Allergies   Allergen Reactions    Iodine Swelling    Walnuts [Tree Nuts] Swelling    Zoster Vaccine Live Swelling    Red Dye Swelling, Rash and Respiratory Distress         PMH: Confirmed with the patient and updated as below  PSH: Confirmed with the patient and updated as below  Social History: Confirmed with the patient and updated as below      MEDICAL HISTORY     Past Medical History:  Past Medical History:   Diagnosis Date    Anemia     Depression     Disorder of thyroid     Encounter for blood transfusion     Gastroesophageal reflux disease     H/O heart artery stent     Hyperlipidemia     Hypertension     Myocardial infarction     Seasonal allergic rhinitis        Past Surgical History:  Past Surgical History:   Procedure Laterality Date    ABDOMINAL SURGERY      BACK SURGERY  CARDIAC SURGERY      stents placed 06/2008    HERNIA REPAIR      HYSTERECTOMY      JOINT REPLACEMENT      SPINE SURGERY         Social History:  Social History      Socioeconomic History    Marital status: Divorced     Spouse name: Not on file    Number of children: Not on file    Years of education: Not on file    Highest education level: Not on file   Occupational History    Not on file   Social Needs    Financial resource strain: Not on file    Food insecurity:     Worry: Not on file     Inability: Not on file    Transportation needs:     Medical: Not on file     Non-medical: Not on file   Tobacco Use    Smoking status: Never Smoker    Smokeless tobacco: Never Used   Substance and Sexual Activity    Alcohol use: No    Drug use: No    Sexual activity: Not on file   Lifestyle    Physical activity:     Days per week: Not on file     Minutes per session: Not on file    Stress: Not on file   Relationships    Social connections:     Talks on phone: Not on file     Gets together: Not on file     Attends religious service: Not on file     Active member of club or organization: Not on file     Attends meetings of clubs or organizations: Not on file     Relationship status: Not on file    Intimate partner violence:     Fear of current or ex partner: Not on file     Emotionally abused: Not on file     Physically abused: Not on file     Forced sexual activity: Not on file   Other Topics Concern    Not on file   Social History Narrative    Not on file       Family History:  Family History   Problem Relation Age of Onset    Heart attack Sister     Heart attack Maternal Aunt     Heart attack Paternal Aunt        Outpatient Medication:  Current Discharge Medication List      CONTINUE these medications which have NOT CHANGED    Details   allopurinol (ZYLOPRIM) 300 MG tablet Take 300 mg by mouth every other day         amLODIPine (NORVASC) 10 MG tablet Take 10 mg by mouth daily.  Refills: 1      atorvastatin (LIPITOR) 80 MG tablet Take 80 mg by mouth daily.      chlorthalidone (HYGROTEN) 25 MG tablet Take 25 mg by mouth every other day.      citalopram (CELEXA) 40 MG  tablet Take 40 mg by mouth daily.      clopidogrel (PLAVIX) 75 mg tablet Take 75 mg by mouth nightly.         isosorbide mononitrate (IMDUR) 30 MG 24 hr tablet Take 60 mg by mouth daily         levothyroxine (SYNTHROID, LEVOTHROID) 75 MCG tablet Take 75 mcg by mouth Once a day  at 6:00am.      metoprolol succinate XL (TOPROL-XL) 25 MG 24 hr tablet Take 0.5 tablets (12.5 mg total) by mouth daily.  Qty: 60 tablet, Refills: 0      montelukast (SINGULAIR) 10 MG tablet Take 10 mg by mouth nightly.  Refills: 3      oxyCODONE (ROXICODONE) 5 MG immediate release tablet Take 1 tablet (5 mg total) by mouth every 4 (four) hours as needed for Pain.  Qty: 30 tablet, Refills: 0      pantoprazole (PROTONIX) 40 MG tablet Take 40 mg by mouth daily.          PROAIR HFA 108 (90 Base) MCG/ACT inhaler 2 puffs every 6 (six) hours as needed.          rivaroxaban (XARELTO) 10 MG Tab Take by mouth daily with dinner               REVIEW OF SYSTEMS     Review of Systems   Constitutional: Negative for fever.   HENT: Negative for sore throat.    Respiratory: Positive for shortness of breath.    Cardiovascular: Positive for chest pain. Negative for palpitations and leg swelling.   Gastrointestinal: Positive for nausea. Negative for abdominal pain, diarrhea and vomiting.   Genitourinary: Negative for dysuria.   Musculoskeletal: Negative for gait problem and neck pain.   Skin: Negative for pallor and rash.   Neurological: Negative for weakness and headaches.   Psychiatric/Behavioral: Negative for confusion.   All other systems reviewed and are negative.      PHYSICAL EXAM     ED Triage Vitals [02/25/18 1206]   Enc Vitals Group      BP (!) 201/186      Heart Rate 94      Resp Rate 18      Temp 98.7 F (37.1 C)      Temp Source Oral      SpO2 95 %      Weight 105.4 kg      Height 1.524 m      Head Circumference       Peak Flow       Pain Score 8      Pain Loc       Pain Edu?       Excl. in GC?        Vitals:    02/25/18 1230 02/25/18 1300 02/25/18  1327 02/25/18 1330   BP: 138/64 135/75 140/78 127/90   Pulse: (!) 48 (!) 47 (!) 50 (!) 51   Resp: 15 14 15 14    Temp:       TempSrc:       SpO2: 94% 98% 94% 95%   Weight:       Height:             Physical Exam   Constitutional: She is oriented to person, place, and time and well-developed, well-nourished, and in no distress. No distress.   Awake and alert, NAD; Non-toxic; No resp distress;   Answers all questions appropriately     HENT:   Head: Normocephalic and atraumatic.   Mouth/Throat: Oropharynx is clear and moist.   Eyes: Pupils are equal, round, and reactive to light. Conjunctivae and EOM are normal. No scleral icterus.   Neck: Normal range of motion. Neck supple.   Cardiovascular: Regular rhythm, normal heart sounds and intact distal pulses. Bradycardia present.   No murmur heard.  Pulmonary/Chest: Effort normal and breath sounds normal. No  stridor. No respiratory distress. She has no wheezes. She has no rales.   Abdominal: Soft. She exhibits no distension. There is no abdominal tenderness.   Musculoskeletal: Normal range of motion.         General: No tenderness or edema.      Comments: No calf tenderness   Neurological: She is alert and oriented to person, place, and time. GCS score is 15.   Skin: Skin is warm and dry. No rash noted. She is not diaphoretic. No erythema. No pallor.   Psychiatric: Affect normal.   Nursing note and vitals reviewed.            DATA REVIEWED     Vital Signs: Reviewed the patient?s vital signs.   Nursing Notes: Reviewed and utilized available nursing notes.  Medical Records Reviewed: Reviewed available past medical records if available.       CARDIAC STUDIES    The following cardiac studies were independently interpreted by the Emergency Medicine Physician.  For full cardiac study results please see chart.    Cardiac Monitor Strip: Interpreted by ED Physician  Rate: 50's  Rhythm: Sinus bradycardia  ST Changes: none      EKG Interpretation;  Interpreted by ED physician  Rate:  Normal for age.  49bpm  Rhythm:  sinus bradycardia  Axis: Normal for age  PR, QRS and QT intervals:  normal for age and rate  ST Segments: No deviations suggestive of ischemia;  No STEMI  Impression: Normal ECG x for bradycardia    Attending : Dr. Karle Plumber      RADIOLOGY IMAGING STUDIES      Xr Chest Ap Portable    Result Date: 02/25/2018   Stable prominent heart and mild bibasilar airspace disease. Darra Lis, MD 02/25/2018 2:01 PM      LABORATORY RESULTS    Ordered and independently interpreted AVAILABLE laboratory tests. Please see results section in chart for full details.    Results     Procedure Component Value Units Date/Time    TSH [[id number]] Collected:  02/25/18 1255     Updated:  02/25/18 1347     TSH 0.96 uIU/mL     GFR [[id number]] Collected:  02/25/18 1255     Updated:  02/25/18 1327     EGFR >60.0    Comprehensive metabolic panel [[id number]]  (Abnormal) Collected:  02/25/18 1255    Specimen:  Blood Updated:  02/25/18 1327     Glucose 104 mg/dL      BUN 96.2 mg/dL      Creatinine 0.9 mg/dL      Sodium 952 mEq/L      Potassium 4.7 mEq/L      Chloride 109 mEq/L      CO2 22 mEq/L      Calcium 8.9 mg/dL      Protein, Total 6.6 g/dL      Albumin 3.9 g/dL      AST (SGOT) 17 U/L      ALT 28 U/L      Alkaline Phosphatase 73 U/L      Bilirubin, Total 0.7 mg/dL      Globulin 2.7 g/dL      Albumin/Globulin Ratio 1.4     Anion Gap 9.0    Magnesium [[id number]] Collected:  02/25/18 1255    Specimen:  Blood Updated:  02/25/18 1327     Magnesium 1.8 mg/dL     B-type Natriuretic Peptide [[id number]] Collected:  02/25/18 1255    Specimen:  Blood  Updated:  02/25/18 1327     B-Natriuretic Peptide 42.8 pg/mL     Troponin I [[id number]] Collected:  02/25/18 1255    Specimen:  Blood Updated:  02/25/18 1324     Troponin I 0.01 ng/mL     PT/APTT [[id number]]  (Abnormal) Collected:  02/25/18 1255     Updated:  02/25/18 1312     PT 21.2 sec      PT INR 1.8     PTT 42 sec     CBC and differential [[id number]]  (Abnormal)  Collected:  02/25/18 1255    Specimen:  Blood Updated:  02/25/18 1303     WBC 7.10 x10 3/uL      Hgb 12.1 g/dL      Hematocrit 08.6 %      Platelets 170 x10 3/uL      RBC 3.87 x10 6/uL      MCV 96.1 fL      MCH 31.3 pg      MCHC 32.5 g/dL      RDW 13 %      MPV 11.2 fL      Neutrophils 65.1 %      Lymphocytes Automated 22.5 %      Monocytes 10.6 %      Eosinophils Automated 1.4 %      Basophils Automated 0.1 %      Immature Granulocyte 0.3 %      Nucleated RBC 0.0 /100 WBC      Neutrophils Absolute 4.62 x10 3/uL      Abs Lymph Automated 1.60 x10 3/uL      Abs Mono Automated 0.75 x10 3/uL      Abs Eos Automated 0.10 x10 3/uL      Absolute Baso Automated 0.01 x10 3/uL      Absolute Immature Granulocyte 0.02 x10 3/uL      Absolute NRBC 0.00 x10 3/uL               EMERGENCY DEPT. MEDICATIONS      ED Medication Orders (From admission, onward)    Start Ordered     Status Ordering Provider    02/25/18 2200 02/25/18 1647  ranolazine (RANEXA) 12 hr tablet 500 mg  Every 12 hours scheduled     Route: Oral  Ordered Dose: 500 mg     Last MAR action:  Not Given VALLABHANENI, MADHURI V    02/25/18 1715 02/25/18 1651  heparin 25,000 units in dextrose 5% 500 mL infusion (premix)  Continuous     Route: Intravenous  Ordered Dose: 9.49 Units/kg/hr     Last MAR action:  RN Verify VALLABHANENI, MADHURI V    02/25/18 1700 02/25/18 1620  nitroglycerin (NITRO-BID) 2 % ointment 0.5 inch  Every six hours - hold midnight     Route: Topical  Ordered Dose: 0.5 inch     Last MAR action:  Ointment Removed VALLABHANENI, MADHURI V    02/25/18 1650 02/25/18 1651  heparin (porcine) injection 3,000 Units  As needed     Route: Intravenous  Ordered Dose: 3,000 Units     Last MAR action:  Given VALLABHANENI, MADHURI V    02/25/18 1517 02/25/18 1518    Continuous     Route: Intravenous  Ordered Dose: 10 mcg/min     Discontinued VALLABHANENI, MADHURI V    02/25/18 1512 02/25/18 1512  ondansetron (ZOFRAN) injection 4 mg  Every 8 hours PRN     Route:  Intravenous  Ordered Dose: 4 mg  Last MAR action:  Given Artis Delay V    02/25/18 1506 02/25/18 1505    Daily     Route: Oral  Ordered Dose: 162 mg     Discontinued VALLABHANENI, MADHURI V    02/25/18 1504 02/25/18 1504  morphine injection 2 mg  Every 2 hours PRN     Route: Intravenous  Ordered Dose: 2 mg     Last MAR action:  Given VALLABHANENI, MADHURI V    02/25/18 1502 02/25/18 1501  0.9%  NaCl infusion  Continuous     Route: Intravenous     Last MAR action:  New Bag VALLABHANENI, MADHURI V    02/25/18 1246 02/25/18 1245  ondansetron (ZOFRAN) injection 4 mg  Once     Route: Intravenous  Ordered Dose: 4 mg     Last MAR action:  Given Kmari Halter H    02/25/18 1246 02/25/18 1245  morphine injection 8 mg  Once     Route: Intravenous  Ordered Dose: 8 mg     Last MAR action:  Given Janan Bogie H    02/25/18 1244 02/25/18 1244    Every 5 min PRN     Route: Sublingual  Ordered Dose: 0.4 mg     Discontinued Hendricks Schwandt H             *This note was generated by the [employer] EMR system/ Dragon speech recognition and may contain inherent errors or omissions not intended by the user. Grammatical errors, random word insertions, deletions, pronoun errors and incomplete sentences are occasional consequences of this technology due to software limitations. Not all errors are caught or corrected. If there are questions or concerns about the content of this note or information contained within the body of this dictation they should be addressed directly with the author for clarification.       Horatio Pel, MD  02/26/18 Moses Manners

## 2018-02-25 NOTE — EDIE (Signed)
COLLECTIVE?NOTIFICATION?02/25/2018 11:48?Weeks, [name]?MRN: [id number]    [name]'s patient encounter information:   HYQ:?[id number]  Account [id number]  Billing Account [id number]      Criteria Met      3 Different Facilities in 90 Days    5 ED Visits in 12 Months    Security and Safety  No recent Security Events currently on file    ED Care Guidelines  There are currently no ED Care Guidelines for this patient. Please check your facility's medical records system.        Prescription Monitoring Program  341??- Narcotic Use Score  180??- Sedative Use Score  000??- Stimulant Use Score  340??- Overdose Risk Score  - All Scores range from 000-999 with 75% of the population scoring < 200 and on 1% scoring above 650  - The last digit of the narcotic, sedative, and stimulant score indicates the number of active prescriptions of that type  - Higher Use scores correlate with increased prescribers, pharmacies, mg equiv, and overlapping prescriptions  - Higher Overdose Risk Scores correlate with increased risk of unintentional overdose death   Concerning or unexpectedly high scores should prompt a review of the PMP record; this does not constitute checking PMP for prescribing purposes.      E.D. Visit Count (12 mo.)  Facility Visits   [facility] 1   [facility] 9   [facility] 1   [facility] 1   [facility] 7   Total 19   Note: Visits indicate total known visits.      Recent Emergency Department Visit Summary  Showing 10 most recent visits out of 19 in the past 12 months  Date Facility [facility] Type Diagnoses or Chief Complaint   Feb 25, 2018  - [place]. [place] [facility]      BAd chest pain      Dec 29, 2017 [facility] [place]. [place] [facility]  Chief Complaint: MED 1 CHEST PAIN    Dec 22, 2017 [facility] [place] [place] [place] [facility]       chest pain, shortness of breath      Chest Pain      Chest pain, unspecified      Dec 14, 2017 [facility] [place] [place] [facility]      Gastro-esophageal reflux disease without esophagitis      Personal history of pulmonary embolism      Allergy to other foods      Chest pain, unspecified      Long term (current) use of aspirin      Allergy status to oth drug/meds/biol subst status      Cough      Allergy status to narcotic agent status      Old myocardial infarction      Major depressive disorder, single episode, unspecified      Dec 08, 2017 [facility]  Emergency      Other long term (current) drug therapy      Personal history of urinary calculi      Radiographic dye allergy status      Gastro-esophageal reflux disease without esophagitis      Major depressive disorder, single episode, unspecified      Hyperlipidemia, unspecified      Allergy status to serum and vaccine status  Hypothyroidism, unspecified      Allergy to other foods      Old myocardial infarction      Nov 25, 2017 [facility] [place] [place] [place] [facility]      CHEST PAIN      Shortness of Breath      Chest Pain      Oct 30, 2017 [facility] [place]. [place] [facility]  Chief Complaint: PT STS CHEST PAINS RADIATES TO BACK    Oct 07, 2017 [facility] [place] [place] [facility]      Hypothyroidism, unspecified      Essential (primary) hypertension      Anemia, unspecified      Hyperlipidemia, unspecified      Personal history of pulmonary embolism      Long term (current) use of aspirin      Allergy status to serum and vaccine status      Old myocardial infarction      Major depressive disorder, single episode, unspecified      Other long term (current) drug therapy      Sep 19, 2017 [facility] [place]. [place] [facility]  Chief Complaint: PT STS CHEST PAIN,FACIAL NUMBNESS,PAIN IN CALF,NAU    Jul 25, 2017 [facility] [place]  [place] [place] [facility]      Light headed; difficulty breathing; visu      Shortness of Breath      Dyspnea, unspecified          Recent Inpatient Visit Summary  Date Facility [facility] Type Diagnoses or Chief Complaint   Dec 29, 2017 [facility] [place]. [place] [facility]  Chief Complaint: C/P WITH MODERATE RISK OF ACUTE CORONARY SYNDROME    Nov 27, 2017 [facility] [place] [place] [facility]      Chest Pain      Chest pain, unspecified      Nov 25, 2017 [facility] [place] [place] [facility]     Oct 31, 2017 [facility] [place]. 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 [place] [facility]  Chief Complaint: CHEST PAIN/WITH MODERATE RISK OF ACUTE CORONARY SY    Jun 03, 2017 [facility] [place]. [place] [facility]      Obstructive sleep apnea (adult) (pediatric)      Long term (current) use of antithrombotics/antiplatelets      Hypothyroidism, unspecified      Athscl heart disease of native coronary artery w/o ang pctrs      Radiographic dye allergy status      Hormone replacement therapy      Presence of coronary angioplasty implant and graft      Allergy status  to narcotic agent status      Family hx of ischem heart dis and oth dis of the circ sys      Family history of stroke      Apr 16, 2017 [facility] [place]      Personal history of other diseases of the circulatory  system      Chest pain, unspecified      Prosth/oth implnt/mtrls cardiovascular devices assoc w incdt      Stenosis of coronary artery stent, initial encounter      Oth surgical procedures cause abn react/compl, w/o misadvnt      Hyperlipidemia, unspecified      Family history of malignant neoplasm of digestive organs      Hormone replacement therapy      Personal history of nicotine dependence      Long term (current) use of anticoagulants      Mar 12, 2017 [facility] [place] [facility]      Athscl heart disease of native coronary artery w/o ang pctrs      Long term (current) use of aspirin      Other long term (current) drug therapy      Family hx of ischem heart dis and oth dis of the circ sys      Chest pain, unspecified      Family history of stroke      Body mass index (BMI) 40.0-44.9, adult      Personal history of other venous thrombosis and embolism      Morbid (severe) obesity due to excess calories      Obstructive sleep apnea (adult) (pediatric)          Care Team  There is not a care team on record at this time.   Collective Portal  This patient has registered at the [facility] Emergency Department   For more information visit: [email or web address]     PLEASE NOTE:     1.   Any care recommendations and other clinical information are provided as guidelines or for historical purposes only, and providers should exercise their own clinical judgment when providing care.    2.   You may only use this information for purposes of treatment, payment or health care operations activities, and subject to the limitations of applicable Collective Policies.    3.   You should consult directly with the organization that provided a care guideline or other clinical history with any questions about additional information or accuracy or completeness of information provided.    ? 2020 [place], [employer] -  [email or web address]

## 2018-02-25 NOTE — Plan of Care (Signed)
Problem: Safety  Goal: Patient will be free from injury during hospitalization  Outcome: Progressing  Flowsheets (Taken 02/25/2018 1945)  Patient will be free from injury during hospitalization : Provide and maintain safe environment; Assess patient's risk for falls and implement fall prevention plan of care per policy; Use appropriate transfer methods; Ensure appropriate safety devices are available at the bedside; Hourly rounding; Include patient/ family/ care giver in decisions related to safety  Goal: Patient will be free from infection during hospitalization  Outcome: Progressing  Flowsheets (Taken 02/25/2018 1945)  Free from Infection during hospitalization: Monitor lab/diagnostic results     Problem: Pain  Goal: Pain at adequate level as identified by patient  Outcome: Progressing  Flowsheets (Taken 02/25/2018 1945)  Pain at adequate level as identified by patient: Assess for risk of opioid induced respiratory depression, including snoring/sleep apnea. Alert healthcare team of risk factors identified.; Identify patient comfort function goal; Reassess pain within 30-60 minutes of any procedure/intervention, per Pain Assessment, Intervention, Reassessment (AIR) Cycle; Evaluate if patient comfort function goal is met; Evaluate patient's satisfaction with pain management progress

## 2018-02-25 NOTE — Consults (Signed)
[facility] CONSULTATION REPORT  [facility]    Date Time: 02/25/18 4:08 PM  Patient Name: [name]  Requesting Physician: Horatio Pel, MD       Reason for Consultation:   CP      History:   [name] is a 68 y.o. female admitted on 02/25/2018.  We have been asked by Horatio Pel, MD,  to provide cardiac consultation, regarding chest pain.  She is a patient with a very complex coronary history as detailed as follows:    1. IMI 2010 with multiple stents to RCA  2. Staged intervention to LAD and diagonal shortly thereafter  3. 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 Multiple presentations with chest pain thereafter - numerous MPI studies and caths which were unrevealing  5. LAD stent 2016 in [place]  6.  We saw her here for chest pain 03/2015:  Lexiscan MPI was normal then.  7.  However 2 additional stents at [facility] 07/2016 with Dr. Irene Shipper  8.  Then one additional stent at [facility] in [facility] 2019  9.  Repeat stent placement June 2019 at [facility] (she reports she has a total of 11 stents)  10. Cardiac catheterization October 2019 at [facility] done by Dr Willa Rough (her regular cardiologist) - patent epicardial vessels with patent stents and branch vessels.  11. PAF during last hospitalization Oct 2019, now on Plavix and Xarelto    She is visiting family by her report, noted severe chest pain radiating down her left arm.  No relief with NTG SL, better with MSO4 IV.  Still having 3/10 chest pain.  TnI x 1 normal.  EKG shows sinus bradycardia with HR 48-52, no ST changes.  Reports taking her medications, no other major changes to her health.    Past Medical History:     Past Medical History:   Diagnosis Date    Anemia     Depression     Disorder of thyroid     Encounter for blood transfusion     Gastroesophageal reflux disease     H/O heart artery stent     Hyperlipidemia     Hypertension     Myocardial infarction      Seasonal allergic rhinitis        Past Surgical History:     Past Surgical History:   Procedure Laterality Date    ABDOMINAL SURGERY      BACK SURGERY      CARDIAC SURGERY      stents placed 06/2008    HERNIA REPAIR      HYSTERECTOMY      JOINT REPLACEMENT      SPINE SURGERY         Family History:     Family History   Problem Relation Age of Onset    Heart attack Sister     Heart attack Maternal Aunt     Heart attack Paternal Aunt        Social History:     Social History     Socioeconomic History    Marital status: Divorced     Spouse name: Not on file    Number of children: Not on file    Years of education: Not on file    Highest education level: Not on file   Occupational History    Not on file   Social Needs    Financial resource strain:  Not on file    Food insecurity:     Worry: Not on file     Inability: Not on file    Transportation needs:     Medical: Not on file     Non-medical: Not on file   Tobacco Use    Smoking status: Never Smoker    Smokeless tobacco: Never Used   Substance and Sexual Activity    Alcohol use: No    Drug use: No    Sexual activity: Not on file   Lifestyle    Physical activity:     Days per week: Not on file     Minutes per session: Not on file    Stress: Not on file   Relationships    Social connections:     Talks on phone: Not on file     Gets together: Not on file     Attends religious service: Not on file     Active member of club or organization: Not on file     Attends meetings of clubs or organizations: Not on file     Relationship status: Not on file    Intimate partner violence:     Fear of current or ex partner: Not on file     Emotionally abused: Not on file     Physically abused: Not on file     Forced sexual activity: Not on file   Other Topics Concern    Not on file   Social History Narrative    Not on file       Allergies:     Allergies   Allergen Reactions    Iodine Swelling    Walnuts [Tree Nuts] Swelling    Zoster Vaccine Live Swelling     Red Dye Swelling, Rash and Respiratory Distress       Medications:   (Not in a hospital admission)        Current Facility-Administered Medications   Medication Dose Route Frequency Provider Last Rate Last Dose    0.9%  NaCl infusion   Intravenous Continuous Carlyon Shadow, Madhuri V, MD 100 mL/hr at 02/25/18 1607      aspirin chewable tablet 162 mg  162 mg Oral Daily Vallabhaneni, Madhuri V, MD   162 mg at 02/25/18 1515    morphine injection 2 mg  2 mg Intravenous Q2H PRN Edwina Barth, MD   2 mg at 02/25/18 1515    nitroglycerin (NITROSTAT) SL tablet 0.4 mg  0.4 mg Sublingual Q5 Min PRN Horatio Pel, MD   0.4 mg at 02/25/18 1333    nitroglycerin (TRIDIL) 50 mg in dextrose 5% 250 mL infusion (premix)  10 mcg/min Intravenous Continuous Vallabhaneni, Madhuri V, MD        ondansetron (ZOFRAN) injection 4 mg  4 mg Intravenous Q8H PRN Edwina Barth, MD   4 mg at 02/25/18 1607     Current Outpatient Medications   Medication Sig Dispense Refill    rivaroxaban (XARELTO) 10 MG Tab Take by mouth daily with dinner      allopurinol (ZYLOPRIM) 300 MG tablet Take 300 mg by mouth nightly.         amLODIPine (NORVASC) 10 MG tablet Take 10 mg by mouth daily.  1    ASPIRIN LOW DOSE 81 MG EC tablet Take 81 mg by mouth daily.  3    atorvastatin (LIPITOR) 80 MG tablet Take 80 mg by mouth daily.      chlorthalidone (HYGROTEN) 25 MG  tablet Take 25 mg by mouth every other day.      citalopram (CELEXA) 40 MG tablet Take 40 mg by mouth daily.      clopidogrel (PLAVIX) 75 mg tablet Take 75 mg by mouth nightly.         ferrous sulfate 325 (65 FE) MG tablet Take 325 mg by mouth 2 (two) times daily.         isosorbide mononitrate (IMDUR) 30 MG 24 hr tablet Take 30 mg by mouth daily.      levothyroxine (SYNTHROID, LEVOTHROID) 75 MCG tablet Take 75 mcg by mouth Once a day at 6:00am.      metoprolol succinate XL (TOPROL-XL) 25 MG 24 hr tablet Take 0.5 tablets (12.5 mg total) by mouth daily. 60 tablet 0     montelukast (SINGULAIR) 10 MG tablet Take 10 mg by mouth nightly.  3    oxyCODONE (ROXICODONE) 5 MG immediate release tablet Take 1 tablet (5 mg total) by mouth every 4 (four) hours as needed for Pain. 30 tablet 0    pantoprazole (PROTONIX) 40 MG tablet Take 40 mg by mouth daily.          PROAIR HFA 108 (90 Base) MCG/ACT inhaler 2 puffs every 6 (six) hours as needed.               Review of Systems:    Comprehensive review of systems including constitutional, eyes, ears, nose, mouth, throat, cardiovascular, GI, GU, musculoskeletal, integumentary, respiratory, neurologic, psychiatric, and endocrine is negative other than what is mentioned already in the history of present illness    Physical Exam:     Vitals:    02/25/18 1330   BP: 127/90   Pulse: (!) 51   Resp: 14   Temp:    SpO2: 95%     Temp (24hrs), Avg:98.7 F (37.1 C), Min:98.7 F (37.1 C), Max:98.7 F (37.1 C)      Intake and Output Summary (Last 24 hours) at Date Time  No intake or output data in the 24 hours ending 02/25/18 1608    GENERAL: Patient is in no acute distress - appears mildly uncomfortable   HEENT: No scleral icterus or conjunctival pallor, moist mucous membranes   NECK: No jugular venous distention or thyromegaly, normal carotid upstrokes without bruits   CARDIAC: Normal apical impulse, regular rate and rhythm, with normal S1 and S2, and no murmurs, rubs, or gallops   CHEST: Clear to auscultation bilaterally, normal respiratory effort  ABDOMEN: No abdominal bruits, masses, or hepatosplenomegaly, nontender, non-distended, good bowel sounds   EXTREMITIES: No clubbing, cyanosis, or edema, 2+ DP, PT, and femoral pulses bilaterally without bruits  SKIN: No rash or jaundice   NEUROLOGIC: Alert and oriented to time, place and person, normal mood and affect  MUSCULOSKELETAL: Normal muscle strength and tone.      Labs Reviewed:     Recent Labs   Lab 02/25/18  1255   Creatine Kinase (CK) 25*   Troponin I 0.01             Recent Labs   Lab  02/25/18  1255   Bilirubin, Total 0.7   Protein, Total 6.6   Albumin 3.9   ALT 28   AST (SGOT) 17     Recent Labs   Lab 02/25/18  1255   Magnesium 1.8     Recent Labs   Lab 02/25/18  1255   PT 21.2*   PT INR 1.8*   PTT 42*  Recent Labs   Lab 02/25/18  1255   WBC 7.10   Hgb 12.1   Hematocrit 37.2   Platelets 170     Recent Labs   Lab 02/25/18  1255   Sodium 140   Potassium 4.7   Chloride 109   CO2 22   BUN 28.8*   Creatinine 0.9   EGFR >60.0   Glucose 104*   Calcium 8.9         Radiology   Radiological Procedure reviewed.      chest X-ray  Assessment:    CAD with by report multiple stents in place since 2010, last one placed June 2019.  Her last cath in Oct 2019 showed patent stents, no major epicardial disease   Chest pain, severe, but with no EKG changes or initial TnI elevation despite hours of pain   PAF noted last admission at [facility], now on Xarelto   HTN, high chol    Recommendations:    Despite her severe pain patient has no EKG changes and negative initial TnI.  I spoke with on call cardiologist covering for her regular MD Dr Elwyn Lade, who states she has had several admissions for chest pain where workup is negative.  Recommend treating for now with NTP, Heparin, low dose BB, Plavix   No plans on cath or ischemic workup unless EKG or cardiac enzyme abnormalities develop   Trial of Ranexa to see if this helps long term anginal symptoms   Upon [place], she will follow up with her regular cardiologists closer to home in [place] [facility].            Signed by: Coralyn Pear, MD      [facility]  NP Spectralink [phone] (8am-5pm)  MD Spectralink [phone] (8am-5pm)  After hours, non urgent consult line [phone]  After Hours, urgent consults [phone]

## 2018-02-25 NOTE — H&P (Signed)
[name] HOSPITAL[facility] H&P Note    Patient Info:   Date/Time: 02/25/2018 / 3:28 PM   Admit Date:02/25/2018  Patient Name:[name]   UEA:[id number]   PCP: Lennox Pippins, MD  Attending Physician:Mehta, Gareth Eagle, MD  Assessment:       --- Chest pain / angina   Discussed with Dr. Amado Nash .  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  Not taking aspirin since Xarelto has been started Oct 2019  --- History of bradycardia Heart rate in the 50s.  --- History of HTN on Norvasc, Chlorthalidone, Toprol xl , hold Toprol xl   --- History of OSA / Morbid obesity  On CPAP   --- History of GERD  On Protonix   --- History of hyperlipidemia on Lipitor   --- History of Hypothyroid on Synthroid   --- History of anxiety / Depression  On Celexa     Enrique Sack  [phone] Daughter Emergency contact       Plan:      As above     DVT Prohylaxis:heparin   Central Line/Foley Catheter/PICC line status: none   Code Status: Full code   Disposition:home  Type of Admission:Observation   Estimated Length of Stay (including stay in the ER receiving treatment):   Milestones required for discharge:Clinical improvement   Clinical Information and History:   Chief Complaint:  Chief Complaint   Patient presents with    Chest Pain     Past Medical History:  Past Medical History:   Diagnosis Date    Anemia     Depression     Disorder of thyroid     Encounter for blood transfusion     Gastroesophageal reflux disease     H/O heart artery stent     Hyperlipidemia      Hypertension     Myocardial infarction     Seasonal allergic rhinitis      Past Surgical History:  Past Surgical History:   Procedure Laterality Date    ABDOMINAL SURGERY      BACK SURGERY      CARDIAC SURGERY      stents placed 06/2008    HERNIA REPAIR      HYSTERECTOMY      JOINT REPLACEMENT      SPINE SURGERY       Family History:  Family History   Problem Relation Age of Onset    Heart attack Sister     Heart attack Maternal Aunt     Heart attack Paternal Aunt      Social History:  Social History     Substance and Sexual Activity   Alcohol Use No     Social History     Substance and Sexual Activity   Drug Use No     Social History     Tobacco Use   Smoking Status Never Smoker   Smokeless Tobacco Never Used     Social History     Socioeconomic History  Marital status: Divorced     Spouse name: None    Number of children: None    Years of education: None    Highest education level: None   Occupational History    None   Social Engineer, site strain: None    Food insecurity:     Worry: None     Inability: None    Transportation needs:     Medical: None     Non-medical: None   Tobacco Use    Smoking status: Never Smoker    Smokeless tobacco: Never Used   Substance and Sexual Activity    Alcohol use: No    Drug use: No    Sexual activity: None   Lifestyle    Physical activity:     Days per week: None     Minutes per session: None    Stress: None   Relationships    Social connections:     Talks on phone: None     Gets together: None     Attends religious service: None     Active member of club or organization: None     Attends meetings of clubs or organizations: None     Relationship status: None    Intimate partner violence:     Fear of current or ex partner: None     Emotionally abused: None     Physically abused: None     Forced sexual activity: None   Other Topics Concern    None   Social History Narrative    None     Allergies:  Allergies   Allergen Reactions    Iodine  Swelling    Walnuts [Tree Nuts] Swelling    Zoster Vaccine Live Swelling    Red Dye Swelling, Rash and Respiratory Distress     Medications:(Not in a hospital admission)    Clinical Presentation: HPI:   [name] is a 68 y.o. female who has history of   Past Surgical History:   Procedure Laterality Date    ABDOMINAL SURGERY      BACK SURGERY      CARDIAC SURGERY      stents placed 06/2008    HERNIA REPAIR      HYSTERECTOMY      JOINT REPLACEMENT      SPINE SURGERY        Past Medical History:   Diagnosis Date    Anemia     Depression     Disorder of thyroid     Encounter for blood transfusion     Gastroesophageal reflux disease     H/O heart artery stent     Hyperlipidemia     Hypertension     Myocardial infarction     Seasonal allergic rhinitis     came with the chief complaint of Chest Pain  Patient is a 68 year old Female presented to the ER with complaints of chest pain.  She is visiting here.  She did not feel well yesterday before she went to bed.  Today morning she woke up around 4:30, complaining of left-sided chest pain radiating to the left arm.  Has been intermittent.  Pain did not get better and came to the ER.  Pain is about 3 - 5 / 10, radiating to the left, and the back.  Associated with mild shortness of breath and dizziness.  Denies any cough, congestion.  No sick contacts.  No fevers, no chills.  Patient states that she  has been taking all medications without missing any doses  Procedures:   Date/Procedure:    Review of Systems:   Chief Complaint:  Chest Pain    Review of Systems   Constitutional: Positive for malaise/fatigue. Negative for chills and fever.   HENT: Negative for congestion and sore throat.    Eyes: Negative for blurred vision and double vision.   Respiratory: Positive for shortness of breath. Negative for cough and wheezing.    Cardiovascular: Positive for chest pain. Negative for orthopnea, claudication and leg swelling.   Gastrointestinal: Positive for  nausea. Negative for abdominal pain, constipation, diarrhea and vomiting.   Neurological: Positive for dizziness. Negative for sensory change, speech change, focal weakness and headaches.   Psychiatric/Behavioral: Positive for depression. Negative for substance abuse.     Physical Exam:     Vitals:    02/25/18 1230 02/25/18 1300 02/25/18 1327 02/25/18 1330   BP: 138/64 135/75 140/78 127/90   Pulse: (!) 48 (!) 47 (!) 50 (!) 51   Resp: 15 14 15 14    Temp:       TempSrc:       SpO2: 94% 98% 94% 95%   Weight:       Height:         Physical Exam:   Physical Exam  HENT:      Head: Normocephalic and atraumatic.   Eyes:      Pupils: Pupils are equal, round, and reactive to light.   Cardiovascular:      Rate and Rhythm: Regular rhythm. Bradycardia present.   Pulmonary:      Effort: Pulmonary effort is normal.      Breath sounds: Normal breath sounds.   Abdominal:      General: Abdomen is flat. Bowel sounds are normal. There is no distension.      Palpations: Abdomen is soft.      Tenderness: There is no abdominal tenderness. There is no guarding.   Musculoskeletal:      Right lower leg: No edema.      Left lower leg: No edema.   Neurological:      General: No focal deficit present.      Mental Status: She is alert and oriented to person, place, and time.      Cranial Nerves: No cranial nerve deficit.      Sensory: No sensory deficit.   Psychiatric:         Mood and Affect: Mood normal.       Results of Labs/imaging:   Labs have been reviewed:   Coagulation Profile:   Recent Labs   Lab 02/25/18  1255   PT 21.2*   PT INR 1.8*   PTT 42*       CBC review:   Recent Labs   Lab 02/25/18  1255   WBC 7.10   Hgb 12.1   Hematocrit 37.2   Platelets 170   MCV 96.1*   RDW 13   Neutrophils 65.1   Lymphocytes Automated 22.5   Eosinophils Automated 1.4   Immature Granulocyte 0.3   Neutrophils Absolute 4.62   Absolute Immature Granulocyte 0.02     Chem Review:  Recent Labs   Lab 02/25/18  1255   Sodium 140   Potassium 4.7   Chloride 109   CO2  22   BUN 28.8*   Creatinine 0.9   Glucose 104*   Calcium 8.9   Magnesium 1.8   Bilirubin, Total 0.7   AST (SGOT) 17  ALT 28   Alkaline Phosphatase 73     Results     Procedure Component Value Units Date/Time    TSH [[id number]] Collected:  02/25/18 1255     Updated:  02/25/18 1347     TSH 0.96 uIU/mL     GFR [[id number]] Collected:  02/25/18 1255     Updated:  02/25/18 1327     EGFR >60.0    Comprehensive metabolic panel [[id number]]  (Abnormal) Collected:  02/25/18 1255    Specimen:  Blood Updated:  02/25/18 1327     Glucose 104 mg/dL      BUN 53.6 mg/dL      Creatinine 0.9 mg/dL      Sodium 644 mEq/L      Potassium 4.7 mEq/L      Chloride 109 mEq/L      CO2 22 mEq/L      Calcium 8.9 mg/dL      Protein, Total 6.6 g/dL      Albumin 3.9 g/dL      AST (SGOT) 17 U/L      ALT 28 U/L      Alkaline Phosphatase 73 U/L      Bilirubin, Total 0.7 mg/dL      Globulin 2.7 g/dL      Albumin/Globulin Ratio 1.4     Anion Gap 9.0    Magnesium [[id number]] Collected:  02/25/18 1255    Specimen:  Blood Updated:  02/25/18 1327     Magnesium 1.8 mg/dL     B-type Natriuretic Peptide [[id number]] Collected:  02/25/18 1255    Specimen:  Blood Updated:  02/25/18 1327     B-Natriuretic Peptide 42.8 pg/mL     Troponin I [[id number]] Collected:  02/25/18 1255    Specimen:  Blood Updated:  02/25/18 1324     Troponin I 0.01 ng/mL     PT/APTT [[id number]]  (Abnormal) Collected:  02/25/18 1255     Updated:  02/25/18 1312     PT 21.2 sec      PT INR 1.8     PTT 42 sec     CBC and differential [[id number]]  (Abnormal) Collected:  02/25/18 1255    Specimen:  Blood Updated:  02/25/18 1303     WBC 7.10 x10 3/uL      Hgb 12.1 g/dL      Hematocrit 22.0 %      Platelets 170 x10 3/uL      RBC 3.87 x10 6/uL      MCV 96.1 fL      MCH 31.3 pg      MCHC 32.5 g/dL      RDW 13 %      MPV 11.2 fL      Neutrophils 65.1 %      Lymphocytes Automated 22.5 %      Monocytes 10.6 %      Eosinophils Automated 1.4 %      Basophils Automated 0.1 %      Immature Granulocyte 0.3 %       Nucleated RBC 0.0 /100 WBC      Neutrophils Absolute 4.62 x10 3/uL      Abs Lymph Automated 1.60 x10 3/uL      Abs Mono Automated 0.75 x10 3/uL      Abs Eos Automated 0.10 x10 3/uL      Absolute Baso Automated 0.01 x10 3/uL      Absolute Immature Granulocyte 0.02 x10 3/uL      Absolute NRBC 0.00 x10  3/uL         Radiology reports have been reviewed:  Radiology Results (24 Hour)     Procedure Component Value Units Date/Time    XR Chest AP Portable [[id number]] Collected:  02/25/18 1400    Order Status:  Completed Updated:  02/25/18 1405    Narrative:       Clinical History: Chest pain.    Findings: AP view of the chest.     Compared with prior studies including 02/14/2017.     Stable prominent heart, magnified by AP technique. Pulmonary vascularity  is normal.     Stable mild elevation of the right hemidiaphragm. Lungs are again  hypoinflated. Mild patchy bibasilar airspace disease appears stable and  may represent atelectasis, but other superimposed airspace process  cannot be excluded.      Impression:        Stable prominent heart and mild bibasilar airspace disease.     Darra Lis, MD   02/25/2018 2:01 PM        EKG: EKG reviewed   Last EKG Result     Procedure Component Value Units Date/Time    ECG 12 Lead [[id number]] Collected:  02/25/18 1502     Updated:  02/25/18 1506     Ventricular Rate 48 BPM      Atrial Rate 48 BPM      P-R Interval 146 ms      QRS Duration 78 ms      Q-T Interval 460 ms      QTC Calculation (Bezet) 410 ms      P Axis 24 degrees      R Axis -8 degrees      T Axis -4 degrees     Narrative:       SINUS BRADYCARDIA  MINIMAL VOLTAGE CRITERIA FOR LVH, MAY BE NORMAL VARIANT  BORDERLINE NORMAL/ABNORMAL ELECTROCARDIOGRAM  WHEN COMPARED WITH ECG OF 25-Feb-2018 11:58,  NO SIGNIFICANT CHANGE WAS FOUND    ECG 12 Lead [[id number]] Collected:  02/25/18 1158     Updated:  02/25/18 1358     Ventricular Rate 49 BPM      Atrial Rate 49 BPM      P-R Interval 150 ms      QRS Duration 82 ms      Q-T  Interval 442 ms      QTC Calculation (Bezet) 399 ms      P Axis 23 degrees      R Axis -4 degrees      T Axis 5 degrees     Narrative:       SINUS BRADYCARDIA  OTHERWISE NORMAL ECG  WHEN COMPARED WITH ECG OF 11-Feb-2017 01:16,  PREMATURE ATRIAL COMPLEXES ARE NO LONGER PRESENT  QT HAS SHORTENED  Confirmed by MARDER MD, CAREY ([id number]) on 02/25/2018 1:58:24 PM          Hospitalist:   Signed by:   Edwina Barth  02/25/2018 3:28 PM    *This note was generated by the Epic EMR system/ Dragon speech recognition and may contain inherent errors or omissions not intended by the user. Grammatical errors, random word insertions, deletions, pronoun errors and incomplete sentences are occasional consequences of this technology due to software limitations. Not all errors are caught or corrected. If there are questions or concerns about the content of this note or information contained within the body of this dictation they should be addressed directly with the author for clarification.

## 2018-02-25 NOTE — ED Notes (Signed)
Nursing Communication - Admission Information:  Diagnosis:   1. Chest pain, unspecified type       Mobility: independent   Isolation: No active isolations  Patient comes from: home  Neuro: a/o x4  Special needs: none  Drips:   Current Facility-Administered Medications   Medication Dose Route Frequency Last Rate    sodium chloride   Intravenous Continuous 100 mL/hr at 02/25/18 1607

## 2018-02-25 NOTE — ED Triage Notes (Addendum)
Pt reports to ED c/o 8/10 L sided chest pain (when at its worst). Reports pain began last night while watching TV. +SOB. Reports aprox 40 min ago pain began to radiate up into L neck and down L arm. Reports vomitting x1 this morning. PMH MI, PE.    Vitals -  Heart Rate: 94  Resp Rate: 18  Temp: 98.7 F (37.1 C)  Temp Source: Oral  SpO2: 95 %  O2 Device: None (Room air)  Pain Score: 8-severe pain    GCS 15

## 2018-02-26 LAB — GFR: EGFR: >60

## 2018-02-26 LAB — BASIC METABOLIC PANEL
Anion Gap: 7 (ref 5.0–15.0)
BUN: 24.1 mg/dL — ABNORMAL HIGH (ref 7.0–19.0)
CO2: 23 mEq/L (ref 22–29)
Calcium: 8.5 mg/dL (ref 8.5–10.5)
Chloride: 111 mEq/L (ref 100–111)
Creatinine: 0.9 mg/dL (ref 0.6–1.0)
Glucose: 99 mg/dL (ref 70–100)
Potassium: 5 mEq/L (ref 3.5–5.1)
Sodium: 141 mEq/L (ref 136–145)

## 2018-02-26 LAB — ECG 12-LEAD
Atrial Rate: 48 {beats}/min
P Axis: 24 degrees
P-R Interval: 146 ms
Q-T Interval: 460 ms
QRS Duration: 78 ms
QTC Calculation (Bezet): 410 ms
R Axis: -8 degrees
T Axis: -4 degrees
Ventricular Rate: 48 {beats}/min

## 2018-02-26 LAB — TROPONIN I: Troponin I: 0.01 ng/mL (ref 0.00–0.05)

## 2018-02-26 LAB — MAGNESIUM: Magnesium: 1.7 mg/dL (ref 1.6–2.6)

## 2018-02-26 LAB — APTT: PTT: 178 s (ref 23–37)

## 2018-02-26 MED ORDER — ISOSORBIDE MONONITRATE ER 30 MG PO TB24
30.00 mg | ORAL_TABLET | Freq: Every day | ORAL | Status: DC
Start: 2018-02-26 — End: 2018-02-26
  Administered 2018-02-26: 11:00:00 30 mg via ORAL
  Filled 2018-02-26: qty 1

## 2018-02-26 MED ORDER — RIVAROXABAN 20 MG PO TABS
20.0000 mg | ORAL_TABLET | Freq: Every day | ORAL | Status: DC
Start: 2018-02-26 — End: 2018-02-28
  Administered 2018-02-26 – 2018-02-27 (×2): 20 mg via ORAL
  Filled 2018-02-26 (×4): qty 1

## 2018-02-26 MED ORDER — ISOSORBIDE MONONITRATE ER 60 MG PO TB24
60.00 mg | ORAL_TABLET | Freq: Every day | ORAL | Status: DC
Start: 2018-02-27 — End: 2018-02-28
  Administered 2018-02-27 – 2018-02-28 (×2): 60 mg via ORAL
  Filled 2018-02-26 (×2): qty 1

## 2018-02-26 MED ORDER — ISOSORBIDE MONONITRATE ER 30 MG PO TB24
30.00 mg | ORAL_TABLET | Freq: Once | ORAL | Status: AC
Start: 2018-02-26 — End: 2018-02-26
  Administered 2018-02-26: 15:00:00 30 mg via ORAL
  Filled 2018-02-26: qty 1

## 2018-02-26 MED ORDER — METOPROLOL SUCCINATE ER 25 MG PO TB24
25.00 mg | ORAL_TABLET | Freq: Every day | ORAL | Status: DC
Start: 2018-02-26 — End: 2018-02-28
  Administered 2018-02-26 – 2018-02-27 (×2): 25 mg via ORAL
  Filled 2018-02-26 (×3): qty 1

## 2018-02-26 MED ORDER — AMLODIPINE BESYLATE 5 MG PO TABS
10.0000 mg | ORAL_TABLET | Freq: Every day | ORAL | Status: DC
Start: 2018-02-26 — End: 2018-02-28
  Administered 2018-02-26 – 2018-02-28 (×3): 10 mg via ORAL
  Filled 2018-02-26 (×3): qty 2

## 2018-02-26 NOTE — Progress Notes (Signed)
Hospitalist paged and informed. Pt had PTT 177.7 this AM. Pt on heparin protocol. Low intensity protocol followed. No new orders noted from Dr. Leonie Man.

## 2018-02-26 NOTE — Progress Notes (Signed)
Situation Pt currently staying with cousin and help cousin pack her home due to cousin moving. Pt had chest pains.      Background Home alone, independent in care. Single level home. Wears O2 at 2L at night. Stents placed in 2010. Walks independently. Receives LIS support for medications. Able to drive.      Assessment Cards consulted. Repeat EKG. TNL.     Recommendation Home with PCP and Cardio follow up. Pt sees no need for home health services. Plans to return to cousins home.              02/26/18 1614   Patient Type   Within 30 Days of Previous Admission? No   Healthcare Decisions   Interviewed: Patient   Orientation/Decision Making Abilities of Patient Alert and Oriented x3, able to make decisions   Advance Directive Patient does not have advance directive   Healthcare Agent Appointed No   Prior to admission   Prior level of function Independent with ADLs;Ambulates independently   Type of Residence Private residence   Home Layout One level   Have running water, electricity, heat, etc? Yes   Living Arrangements Alone   How do you get to your MD appointments? drive   How do you get your groceries? drive   Who fixes your meals? self   Who picks up your prescriptions? self   Dressing Independent   Grooming Independent   Feeding Independent   Bathing Independent   Toileting Independent   DME Currently at Home Home O2  (Wears O2 at 2L at night)   Adult Protective Services (APS) involved? No   Discharge Planning   Support Systems Family members   Patient expects to be discharged to: home   Anticipated [place] plan discussed with: Patient   Mode of transportation: Private car (family member)   Does the patient have perscription coverage? Yes   Consults/Providers   Correct PCP listed in Epic? No (comment)  (Pt goes to [facility] in [place])   Vernona Rieger Allison-McCloud, Rn, BSN, CM  Case Manager x [phone]

## 2018-02-26 NOTE — Progress Notes (Signed)
Hospitalist paged and informed. Pt had PTT 194 this AM. Pt on heparin protocol. Low intensity protocol followed. No new orders noted from Dr. Morene Crocker.

## 2018-02-26 NOTE — Plan of Care (Signed)
Problem: Safety  Goal: Patient will be free from injury during hospitalization  Outcome: Progressing  Flowsheets (Taken 02/26/2018 1011 by Vella Redhead, RN)  Patient will be free from injury during hospitalization : Assess patient's risk for falls and implement fall prevention plan of care per policy;Provide and maintain safe environment;Use appropriate transfer methods;Ensure appropriate safety devices are available at the bedside;Include patient/ family/ care giver in decisions related to safety;Hourly rounding;Assess for patients risk for elopement and implement Elopement Risk Plan per policy;Provide alternative method of communication if needed (communication boards, writing)  Goal: Patient will be free from infection during hospitalization  Outcome: Progressing  Flowsheets (Taken 02/26/2018 1011 by Vella Redhead, RN)  Free from Infection during hospitalization: Assess and monitor for signs and symptoms of infection;Monitor lab/diagnostic results;Monitor all insertion sites (i.e. indwelling lines, tubes, urinary catheters, and drains);Encourage patient and family to use good hand hygiene technique     Problem: Pain  Goal: Pain at adequate level as identified by patient  Outcome: Progressing  Flowsheets (Taken 02/26/2018 1011 by Vella Redhead, RN)  Pain at adequate level as identified by patient: Identify patient comfort function goal;Assess for risk of opioid induced respiratory depression, including snoring/sleep apnea. Alert healthcare team of risk factors identified.;Assess pain on admission, during daily assessment and/or before any "as needed" intervention(s);Reassess pain within 30-60 minutes of any procedure/intervention, per Pain Assessment, Intervention, Reassessment (AIR) Cycle;Evaluate if patient comfort function goal is met;Evaluate patient's satisfaction with pain management progress;Offer non-pharmacological pain management interventions;Consult/collaborate with Pain  Service;Consult/collaborate with Physical Therapy, Occupational Therapy, and/or Speech Therapy;Include patient/patient care companion in decisions related to pain management as needed     Problem: Side Effects from Pain Analgesia  Goal: Patient will experience minimal side effects of analgesic therapy  Outcome: Progressing  Flowsheets (Taken 02/26/2018 1011 by Vella Redhead, RN)  Patient will experience minimal side effects of analgesic therapy: Prevent/manage side effects per LIP orders (i.e. nausea, vomiting, pruritus, constipation, urinary retention, etc.);Evaluate for opioid-induced sedation with appropriate assessment tool (i.e. POSS);Assess for changes in cognitive function;Monitor/assess patient's respiratory status (RR depth, effort, breath sounds)     Problem: Discharge Barriers  Goal: Patient will be discharged home or other facility with appropriate resources  Outcome: Progressing     Problem: Psychosocial and Spiritual Needs  Goal: Demonstrates ability to cope with hospitalization/illness  Outcome: Progressing  Flowsheets (Taken 02/26/2018 1934)  Demonstrates ability to cope with hospitalizations/illness: Encourage verbalization of feelings/concerns/expectations; Provide quiet environment     Problem: Moderate/High Fall Risk Score >5  Goal: Patient will remain free of falls  Outcome: Progressing  Flowsheets (Taken 02/26/2018 1925)  Moderate Risk (6-13): MOD-Place Fall Risk level on whiteboard in room;MOD-Use of assistive devices-bedside commode if appropriate;MOD-Remain with patient during toileting;LOW-Fall Interventions Appropriate for Low Fall Risk

## 2018-02-26 NOTE — Plan of Care (Signed)
Problem: Safety  Goal: Patient will be free from injury during hospitalization  Outcome: Progressing  Flowsheets (Taken 02/26/2018 1011)  Patient will be free from injury during hospitalization : Assess patient's risk for falls and implement fall prevention plan of care per policy; Provide and maintain safe environment; Use appropriate transfer methods; Ensure appropriate safety devices are available at the bedside; Include patient/ family/ care giver in decisions related to safety; Hourly rounding; Assess for patients risk for elopement and implement Elopement Risk Plan per policy; Provide alternative method of communication if needed (communication boards, writing)  Goal: Patient will be free from infection during hospitalization  Outcome: Progressing  Flowsheets (Taken 02/26/2018 1011)  Free from Infection during hospitalization: Assess and monitor for signs and symptoms of infection; Monitor lab/diagnostic results; Monitor all insertion sites (i.e. indwelling lines, tubes, urinary catheters, and drains); Encourage patient and family to use good hand hygiene technique     Problem: Safety  Goal: Patient will be free from infection during hospitalization  Outcome: Progressing  Flowsheets (Taken 02/26/2018 1011)  Free from Infection during hospitalization: Assess and monitor for signs and symptoms of infection; Monitor lab/diagnostic results; Monitor all insertion sites (i.e. indwelling lines, tubes, urinary catheters, and drains); Encourage patient and family to use good hand hygiene technique     Problem: Pain  Goal: Pain at adequate level as identified by patient  Outcome: Progressing  Flowsheets (Taken 02/26/2018 1011)  Pain at adequate level as identified by patient: Identify patient comfort function goal; Assess for risk of opioid induced respiratory depression, including snoring/sleep apnea. Alert healthcare team of risk factors identified.; Assess pain on admission, during daily assessment and/or before any  "as needed" intervention(s); Reassess pain within 30-60 minutes of any procedure/intervention, per Pain Assessment, Intervention, Reassessment (AIR) Cycle; Evaluate if patient comfort function goal is met; Evaluate patient's satisfaction with pain management progress; Offer non-pharmacological pain management interventions; Consult/collaborate with Pain Service; Consult/collaborate with Physical Therapy, Occupational Therapy, and/or Speech Therapy; Include patient/patient care companion in decisions related to pain management as needed

## 2018-02-26 NOTE — Progress Notes (Signed)
[name] HOSPITALIST  Progress Note  Patient Info:   Date/Time: 02/26/2018 / 2:17 PM   Admit Date:02/25/2018  Patient Name:[name]   ZOX:[id number]   PCP: Lennox Pippins, MD  Attending Physician:Higinio Grow V,*     Assessment and Plan:       --- Chest pain / angina   Serial troponins  Negative, d dimer  Negative   Discussed with Dr. Amado Nash .  Trial of Ranexa,  Increase Imdur 60mg  and watch     --- Parox Afib found during Cath Dec 2019 at [facility] and has been started on Xarelto 20mg    --- History of PE in 2016, treated with coumadin for 6 months   --- History of CAD Status post stent to left anterior descending, left circumflex, RCA  Recent cardiac angiogram November 29, 2017 at [facility] showed patent stents, severe branch vessel ( Septal ,  RV marginal disease )  On Plavix.  Not taking aspirin since Xarelto has been started Oct 2019  Discussed with Dr. Amado Nash .  Continue Plavix and Xarelto.  No aspirin for now.  --- History of bradycardia Heart rate in the 50s.  --- History of HTN on Norvasc, Chlorthalidone, Toprol xl decrease to 25mg    --- History of OSA / Morbid obesity  On CPAP   --- History of GERD  On Protonix   --- History of hyperlipidemia on Lipitor   --- History of Hypothyroid on Synthroid   --- History of anxiety / Depression  On Celexa         DVT Prohylaxis:xarelto   Central Line/Foley Catheter/PICC line status: none  Code Status: Full Code  Disposition:home  Type of Admission:Observation  Expected Date of Discharge: 1-2 days  Milestones required for discharge:Clinical improvement     Hospital Problems:   Active Problems:    Angina at rest    Subjective:   02/26/18   Complains of intermittent left-sided chest pain radiating to the arm.    Chief Complaint:  Chest Pain    Review of Systems   Constitutional: Negative for chills and fever.   Respiratory: Negative for cough and shortness of breath.    Cardiovascular: Positive for chest pain. Negative  for leg swelling.   Gastrointestinal: Negative for abdominal pain, diarrhea, nausea and vomiting.   Genitourinary: Negative for dysuria.   Neurological: Negative for dizziness, sensory change, speech change and focal weakness.     Objective:     Vitals:    02/26/18 0457 02/26/18 0810 02/26/18 1109 02/26/18 1317   BP: 124/70 120/70 133/67 126/75   Pulse: (!) 55 70 62 (!) 57   Resp: 16 18  18    Temp: 98.1 F (36.7 C) 98.4 F (36.9 C)  98.6 F (37 C)   TempSrc: Oral Oral  Oral   SpO2: 95% 96% 90% 95%   Weight:       Height:         Physical Exam:   Physical Exam   Constitutional: She is oriented to person, place, and time and well-developed, well-nourished, and in no distress.   HENT:   Head: Normocephalic and atraumatic.   Eyes: Pupils are equal, round, and reactive to light.   Cardiovascular: Normal rate and regular rhythm.   Pulmonary/Chest: Effort normal and breath sounds normal. No respiratory distress. She has no wheezes. She has no rales.   Abdominal: Soft. Bowel sounds are normal. She exhibits no distension. There is no abdominal tenderness. There is no rebound.  Musculoskeletal:         General: No edema.   Neurological: She is alert and oriented to person, place, and time. No cranial nerve deficit. GCS score is 15.     Results of Labs/imaging   Labs and radiology reports have been reviewed.    Hospitalist   Signed by:   Edwina Barth  02/26/2018 2:17 PM    *This note was generated by the Epic EMR system/ Dragon speech recognition and may contain inherent errors or omissions not intended by the user. Grammatical errors, random word insertions, deletions, pronoun errors and incomplete sentences are occasional consequences of this technology due to software limitations. Not all errors are caught or corrected. If there are questions or concerns about the content of this note or information contained within the body of this dictation they should be addressed directly with the author for  clarification

## 2018-02-26 NOTE — UM Notes (Signed)
ADMISSION CLINICALS ED/OBS admit 02/25/18 then changed to INPATIENT 02/26/18 :      67yo female to ED 02/25/18 1245pm with recurrent CP.    Past Medical History:   Diagnosis Date    Anemia     Depression     Disorder of thyroid     Encounter for blood transfusion     Gastroesophageal reflux disease     H/O heart artery stent     Hyperlipidemia     Hypertension     Myocardial infarction     Seasonal allergic rhinitis        MD assessment/plan :  --- Chest pain / angina   Discussed with Dr. Amado Nash .  Iv Heparin drip, Trial of Ranexa, Nitropaste, ASA, Plavix   Serial troponins, d dimer     --- Parox Afib found during Cath Dec 2019 at [facility] and has been started on Xarelto 20mg    --- History of PE in 2016, treated with coumadin for 6 months   --- History of CAD Status post stent to left anterior descending, left circumflex, RCA  Recent cardiac angiogram November 29, 2017 at [facility] showed patent stents, severe branch vessel ( Septal ,  RV marginal disease )  On Plavix.  Not taking aspirin since Xarelto has been started Oct 2019  --- History of bradycardia Heart rate in the 50s.  --- History of HTN on Norvasc, Chlorthalidone, Toprol xl , hold Toprol xl   --- History of OSA / Morbid obesity  On CPAP   --- History of GERD  On Protonix   --- History of hyperlipidemia on Lipitor   --- History of Hypothyroid on Synthroid   --- History of anxiety / Depression  On Celexa     Enrique Sack  [phone] Daughter Emergency contact       Plan:      As above     DVT Prohylaxis:heparin       Cardio consult 02/25/18 :  Assessment:    CAD with by report multiple stents in place since 2010, last one placed June 2019.  Her last cath in Oct 2019 showed patent stents, no major epicardial disease   Chest pain, severe, but with no EKG changes or initial TnI elevation despite hours of pain   PAF noted last admission at [facility], now on Xarelto   HTN, high chol    Recommendations:    Despite  her severe pain patient has no EKG changes and negative initial TnI.  I spoke with on call cardiologist covering for her regular MD Dr Elwyn Lade, who states she has had several admissions for chest pain where workup is negative.  Recommend treating for now with NTP, Heparin, low dose BB, Plavix   No plans on cath or ischemic workup unless EKG or cardiac enzyme abnormalities develop   Trial of Ranexa to see if this helps long term anginal symptoms   Upon [place], she will follow up with her regular cardiologists closer to home in [facility].        CHANGED to INPATIENT 02/26/18 due to continued c/o chest pain/Angina @ rest, initially on IV Heparin gtt, now other med changes :    On floor 02/26/18 :  98.6, HR 55, RR 18, BP 133/67, sats 90% RA, sats to 94% O2 2L NC  BUN 24.1, Trop .01, Mg 1.7      2mg  IV Morphine given xs 2      Per Cardio 02/26/18 :  Assessment:      CAD with by report multiple stents in place since 2010, last one placed June 2019. Her last cath in Oct 2019 showed patent stents, no major epicardial disease   Chest pain, severe, but with no EKG changes or initial TnI elevation despite hours of pain   PAF noted last admission at [facility], now on Xarelto   HTN, high chol   Sinus bradycardia into upper 40s    Recommendations:    Repeat TnI x 1 now given recurrent CP.  Repeat EKG as well   Continue present meds, cut down on Toprol to 25 mg qd   No objective evidence of acute coronary injury, has had multiple ischemic workups including a negative cath just 3 months ago.  She should be treated medically and eventually follow up with her regular providers in [place]        MD assessment/plan 02/26/18 :  --- Chest pain/ angina  Serial troponins  Negative, d dimer  Negative   Discussed with Dr. Amado Nash .  Trial of Ranexa,  Increase Imdur 60mg  and watch   --- Parox Afib found during Cath Dec 2019 at [facility] and has been started on Xarelto 20mg    --- History of PE in 2016,  treated with coumadin for 6 months   --- History of CADStatus post stent to left anterior descending, left circumflex, RCA  Recent cardiac angiogram November 29, 2017 at [facility] showed patent stents, severe branch vessel ( Septal , RV marginal disease )  On Plavix. Not taking aspirin since Xarelto has been started Oct 2019  Discussed with Dr. Amado Nash .  Continue Plavix and Xarelto.  No aspirin for now.  --- History of bradycardia Heart rate in the 50s.  --- History of HTNon Norvasc, Chlorthalidone, Toprol xl decrease to 25mg    --- History of OSA / Morbid obesity On CPAP   --- History ofGERD On Protonix   --- History of hyperlipidemia on Lipitor   --- History of Hypothyroid on Synthroid   --- History of anxiety / Depression On Celexa   DVT Prohylaxis:xarelto         ORDERS : tele, ck Trop, Cardio, po Allopurinol, Amlodipine, Lipitor, Celexa, Plavix, Imdur, Synthyroid, Metoprolol, Singulair, .5in Nitro q6, po Protonix, Ranexa, Xarelto, IV Heparin gtt - DCd 02/26/18, .9% NaCl infusion 133ml/h cont - DCd 02/26/18, prn Tylenol, prn IV Morphine, prn IV Narcan, prn SL Nitro, prn IV Zofran, diet as tol, I/O, supp O2

## 2018-02-26 NOTE — Progress Notes (Signed)
[place] [facility]  PROGRESS NOTE  [facility]    Date Time: 02/26/18 8:48 AM  Patient Name: [facility]       Patient Active Problem List   Diagnosis    Chest pain possibly costochondritis     Coronary artery disease    HTN (hypertension)    HLD (hyperlipidemia)    Gout    Nephrolithiasis    History of pulmonary embolism    Hypothyroid    Obesity    Depression    GERD (gastroesophageal reflux disease)    Anemia    OSA (obstructive sleep apnea)    Angina at rest       Assessment:      CAD with by report multiple stents in place since 2010, last one placed June 2019.  Her last cath in Oct 2019 showed patent stents, no major epicardial disease   Chest pain, severe, but with no EKG changes or initial TnI elevation despite hours of pain   PAF noted last admission at [facility], now on Xarelto   HTN, high chol   Sinus bradycardia into upper 40s    Recommendations:    Repeat TnI x 1 now given recurrent CP.  Repeat EKG as well   Continue present meds, cut down on Toprol to 25 mg qd   No objective evidence of acute coronary injury, has had multiple ischemic workups including a negative cath just 3 months ago.  She should be treated medically and eventually follow up with her regular providers in [place]      Medications:      Scheduled Meds: PRN Meds:    allopurinol, 300 mg, Oral, QHS  aspirin, 81 mg, Oral, Daily  atorvastatin, 80 mg, Oral, Daily  citalopram, 20 mg, Oral, Daily  clopidogrel, 75 mg, Oral, QHS  levothyroxine, 75 mcg, Oral, Daily at 0600  montelukast, 10 mg, Oral, QHS  nitroglycerin, 0.5 inch, Topical, Q6H HOLD MN  pantoprazole, 40 mg, Oral, QAM AC  ranolazine, 500 mg, Oral, Q12H SCH        Continuous Infusions:   sodium chloride 100 mL/hr at 02/26/18 0121    heparin infusion 25,000 units/500 mL (Cardiac/Low Intensity) 1.49 Units/kg/hr (02/26/18 0706)    acetaminophen, 650 mg, Q4H PRN    Or  acetaminophen, 650 mg, Q4H PRN  heparin (porcine), 3,000 Units, PRN  morphine, 2 mg,  Q2H PRN  naloxone, 0.2 mg, PRN  ondansetron, 4 mg, Q8H PRN              Subjective:   Still having chest pain, soreness, never has gone away      Physical Exam:     Vitals:    02/26/18 0810   BP: 120/70   Pulse: 70   Resp: 18   Temp: 98.4 F (36.9 C)   SpO2: 96%     Temp (24hrs), Avg:98.2 F (36.8 C), Min:97.7 F (36.5 C), Max:98.7 F (37.1 C)      Telemetry reviewed no changes.     Intake and Output Summary (Last 24 hours) at Date Time    Intake/Output Summary (Last 24 hours) at 02/26/2018 0848  Last data filed at 02/25/2018 1758  Gross per 24 hour   Intake 240 ml   Output    Net 240 ml       General Appearance:  Breathing comfortable, no acute distress  Head:  normocephalic  Eyes:  EOM's intact  Neck:  No carotid bruit or jugular venous distension, brisk carotid upstroke  Lungs:  Clear to auscultation throughout, no wheezes, rhonchi or rales, good respiratory effort   Chest Wall:  No tenderness or deformity  Heart:  S1, S2 normal, no S3, no S4, no murmur, PMI not displaced, no rub   Abdomen:  Soft, non-tender, positive bowel sounds, no hepatojugular reflux  Extremities:  No cyanosis, clubbing or edema  Pulses:  Equal radial pulses, 4/4 symmetric  Neurologic:  Alert and oriented x3, mood and affect normal  Musculoskeletal: normal strength and tone    Labs:     Recent Labs   Lab 02/25/18  1807 02/25/18  1733 02/25/18  1255   Creatine Kinase (CK)  --   --  25*   Troponin I <0.01 <0.01 0.01             Recent Labs   Lab 02/25/18  1255   Bilirubin, Total 0.7   Protein, Total 6.6   Albumin 3.9   ALT 28   AST (SGOT) 17     Recent Labs   Lab 02/26/18  0541   Magnesium 1.7     Recent Labs   Lab 02/26/18  0428  02/25/18  1255   PT  --   --  21.2*   PT INR  --   --  1.8*   PTT 178*  More results in Results Review 42*   More results in Results Review = values in this interval not displayed.     Recent Labs   Lab 02/25/18  1255   WBC 7.10   Hgb 12.1   Hematocrit 37.2   Platelets 170     Recent Labs   Lab 02/26/18  0541  02/25/18  1255   Sodium 141 140   Potassium 5.0 4.7   Chloride 111 109   CO2 23 22   BUN 24.1* 28.8*   Creatinine 0.9 0.9   EGFR >60.0 >60.0   Glucose 99 104*   Calcium 8.5 8.9     Recent Labs   Lab 02/25/18  1255   TSH 0.96       .  Lab Results   Component Value Date    BNP 42.8 02/25/2018      Estimated Creatinine Clearance: 73.1 mL/min (based on SCr of 0.9 mg/dL).    Weight Monitoring 02/11/2017 02/13/2017 02/14/2017 02/15/2017 02/17/2017 02/25/2018 02/25/2018   Height 152.4 cm - - - - 152.4 cm 165.1 cm   Height Method Stated - - - - Stated -   Weight 102.967 kg 105.235 kg 104.509 kg 104.055 kg 105.733 kg 105.4 kg 105.3 kg   Weight Method Standing Scale Bed Scale Standing Scale Standing Scale Standing Scale Standing Scale -   BMI (calculated) 44.4 kg/m2 - - - - 45.5 kg/m2 38.7 kg/m2         Imaging:   Radiological Procedure reviewed.              Signed by: Coralyn Pear, MD        [facility]  NP Spectralink [phone] (8am-5pm)  MD Spectralink [phone] (8am-5pm)  After hours, non urgent consult line [phone]  After Hours, urgent consults [phone]

## 2018-02-27 MED ORDER — NITROGLYCERIN 0.4 MG SL SUBL
0.40 mg | SUBLINGUAL_TABLET | SUBLINGUAL | Status: DC | PRN
Start: 2018-02-27 — End: 2018-02-28
  Administered 2018-02-27: 14:00:00 0.4 mg via SUBLINGUAL
  Filled 2018-02-27: qty 1

## 2018-02-27 MED ORDER — OXYCODONE-ACETAMINOPHEN 5-325 MG PO TABS
1.0000 | ORAL_TABLET | Freq: Three times a day (TID) | ORAL | Status: DC | PRN
Start: 2018-02-27 — End: 2018-02-28
  Administered 2018-02-27 – 2018-02-28 (×2): 1 via ORAL
  Filled 2018-02-27 (×3): qty 1

## 2018-02-27 MED ORDER — POLYETHYLENE GLYCOL 3350 17 G PO PACK
17.00 g | PACK | Freq: Every day | ORAL | Status: DC
Start: 2018-02-27 — End: 2018-02-28
  Administered 2018-02-27 – 2018-02-28 (×2): 17 g via ORAL
  Filled 2018-02-27 (×2): qty 1

## 2018-02-27 NOTE — Plan of Care (Signed)
Problem: Safety  Goal: Patient will be free from injury during hospitalization  Outcome: Progressing  Flowsheets (Taken 02/27/2018 1038)  Patient will be free from injury during hospitalization : Assess patient's risk for falls and implement fall prevention plan of care per policy; Use appropriate transfer methods; Provide and maintain safe environment; Ensure appropriate safety devices are available at the bedside; Include patient/ family/ care giver in decisions related to safety; Hourly rounding; Assess for patients risk for elopement and implement Elopement Risk Plan per policy; Provide alternative method of communication if needed (communication boards, writing)  Goal: Patient will be free from infection during hospitalization  Outcome: Progressing  Flowsheets (Taken 02/27/2018 1038)  Free from Infection during hospitalization: Assess and monitor for signs and symptoms of infection; Monitor lab/diagnostic results; Monitor all insertion sites (i.e. indwelling lines, tubes, urinary catheters, and drains); Encourage patient and family to use good hand hygiene technique     Problem: Pain  Goal: Pain at adequate level as identified by patient  Outcome: Progressing  Flowsheets (Taken 02/27/2018 1038)  Pain at adequate level as identified by patient: Identify patient comfort function goal; Assess for risk of opioid induced respiratory depression, including snoring/sleep apnea. Alert healthcare team of risk factors identified.; Assess pain on admission, during daily assessment and/or before any "as needed" intervention(s); Reassess pain within 30-60 minutes of any procedure/intervention, per Pain Assessment, Intervention, Reassessment (AIR) Cycle; Evaluate if patient comfort function goal is met; Evaluate patient's satisfaction with pain management progress; Offer non-pharmacological pain management interventions; Consult/collaborate with Pain Service; Consult/collaborate with Physical Therapy, Occupational Therapy,  and/or Speech Therapy; Include patient/patient care companion in decisions related to pain management as needed     Problem: Side Effects from Pain Analgesia  Goal: Patient will experience minimal side effects of analgesic therapy  Outcome: Progressing     Problem: Discharge Barriers  Goal: Patient will be discharged home or other facility with appropriate resources  Outcome: Progressing

## 2018-02-27 NOTE — Plan of Care (Signed)
Problem: Safety  Goal: Patient will be free from injury during hospitalization  Outcome: Progressing  Flowsheets (Taken 02/27/2018 1038 by Vella Redhead, RN)  Patient will be free from injury during hospitalization : Assess patient's risk for falls and implement fall prevention plan of care per policy;Use appropriate transfer methods;Provide and maintain safe environment;Ensure appropriate safety devices are available at the bedside;Include patient/ family/ care giver in decisions related to safety;Hourly rounding;Assess for patients risk for elopement and implement Elopement Risk Plan per policy;Provide alternative method of communication if needed (communication boards, writing)  Goal: Patient will be free from infection during hospitalization  Outcome: Progressing     Problem: Pain  Goal: Pain at adequate level as identified by patient  Outcome: Progressing  Flowsheets (Taken 02/27/2018 1038 by Vella Redhead, RN)  Pain at adequate level as identified by patient: Identify patient comfort function goal;Assess for risk of opioid induced respiratory depression, including snoring/sleep apnea. Alert healthcare team of risk factors identified.;Assess pain on admission, during daily assessment and/or before any "as needed" intervention(s);Reassess pain within 30-60 minutes of any procedure/intervention, per Pain Assessment, Intervention, Reassessment (AIR) Cycle;Evaluate if patient comfort function goal is met;Evaluate patient's satisfaction with pain management progress;Offer non-pharmacological pain management interventions;Consult/collaborate with Pain Service;Consult/collaborate with Physical Therapy, Occupational Therapy, and/or Speech Therapy;Include patient/patient care companion in decisions related to pain management as needed     Problem: Side Effects from Pain Analgesia  Goal: Patient will experience minimal side effects of analgesic therapy  Outcome: Progressing  Flowsheets (Taken 02/26/2018 1011 by  Vella Redhead, RN)  Patient will experience minimal side effects of analgesic therapy: Prevent/manage side effects per LIP orders (i.e. nausea, vomiting, pruritus, constipation, urinary retention, etc.);Evaluate for opioid-induced sedation with appropriate assessment tool (i.e. POSS);Assess for changes in cognitive function;Monitor/assess patient's respiratory status (RR depth, effort, breath sounds)     Problem: Discharge Barriers  Goal: Patient will be discharged home or other facility with appropriate resources  Outcome: Progressing     Problem: Psychosocial and Spiritual Needs  Goal: Demonstrates ability to cope with hospitalization/illness  Outcome: Progressing  Flowsheets (Taken 02/26/2018 1934)  Demonstrates ability to cope with hospitalizations/illness: Encourage verbalization of feelings/concerns/expectations;Provide quiet environment     Problem: Moderate/High Fall Risk Score >5  Goal: Patient will remain free of falls  Outcome: Progressing

## 2018-02-27 NOTE — Progress Notes (Signed)
[facility]  PROGRESS NOTE  [facility]    Date Time: 02/27/18 9:01 AM  Patient Name: [facility]       Patient Active Problem List   Diagnosis    Chest pain possibly costochondritis     Coronary artery disease    HTN (hypertension)    HLD (hyperlipidemia)    Gout    Nephrolithiasis    History of pulmonary embolism    Hypothyroid    Obesity    Depression    GERD (gastroesophageal reflux disease)    Anemia    OSA (obstructive sleep apnea)    Angina at rest       Assessment:      CAD with by report multiple stents in place since 2010, last one placed June 2019. Her last cath in Oct 2019 showed patent stents, no major epicardial disease   Chest pain, severe, but with no EKG changes or initial TnI elevation despite hours of pain   PAF noted last admission at [facility], now on Xarelto   HTN, high chol   Sinus bradycardia into upper 40s    Recommendations:    Would d/c as troponin is negative out pt thallium      Medications:      Scheduled Meds: PRN Meds:    allopurinol, 300 mg, Oral, QHS  amLODIPine, 10 mg, Oral, Daily  atorvastatin, 80 mg, Oral, Daily  citalopram, 20 mg, Oral, Daily  clopidogrel, 75 mg, Oral, QHS  isosorbide mononitrate, 60 mg, Oral, Daily  levothyroxine, 75 mcg, Oral, Daily at 0600  metoprolol succinate XL, 25 mg, Oral, Daily  montelukast, 10 mg, Oral, QHS  pantoprazole, 40 mg, Oral, QAM AC  ranolazine, 500 mg, Oral, Q12H SCH  rivaroxaban, 20 mg, Oral, Daily with dinner        Continuous Infusions:   acetaminophen, 650 mg, Q4H PRN    Or  acetaminophen, 650 mg, Q4H PRN  morphine, 2 mg, Q2H PRN  naloxone, 0.2 mg, PRN  ondansetron, 4 mg, Q8H PRN              Subjective:   Denies chest pain, SOB or palpitations.      Physical Exam:     Vitals:    02/27/18 0830   BP: 130/77   Pulse: 61   Resp: 16   Temp: 98.4 F (36.9 C)   SpO2: 93%     Temp (24hrs), Avg:98.3 F (36.8 C), Min:97.9 F (36.6 C), Max:98.6 F (37 C)      Telemetry reviewed no changes.     Intake and  Output Summary (Last 24 hours) at Date Time    Intake/Output Summary (Last 24 hours) at 02/27/2018 0901  Last data filed at 02/26/2018 1317  Gross per 24 hour   Intake 660 ml   Output    Net 660 ml       General Appearance:  Breathing comfortable, no acute distress  Head:  normocephalic  Eyes:  EOM's intact  Neck:  No carotid bruit or jugular venous distension, brisk carotid upstroke  Lungs:  Clear to auscultation throughout, no wheezes, rhonchi or rales, good respiratory effort   Chest Wall:  No tenderness or deformity  Heart:  S1, S2 normal, no S3, no S4, no murmur, PMI not displaced, no rub   Abdomen:  Soft, non-tender, positive bowel sounds, no hepatojugular reflux  Extremities:  No cyanosis, clubbing or edema  Pulses:  Equal radial pulses, 4/4 symmetric  Neurologic:  Alert and oriented x3,  mood and affect normal  Musculoskeletal: normal strength and tone    Labs:     Recent Labs   Lab 02/26/18  0910 02/25/18  1807 02/25/18  1733 02/25/18  1255   Creatine Kinase (CK)  --   --   --  25*   Troponin I 0.01 <0.01 <0.01 0.01             Recent Labs   Lab 02/25/18  1255   Bilirubin, Total 0.7   Protein, Total 6.6   Albumin 3.9   ALT 28   AST (SGOT) 17     Recent Labs   Lab 02/26/18  0541   Magnesium 1.7     Recent Labs   Lab 02/26/18  0428  02/25/18  1255   PT  --   --  21.2*   PT INR  --   --  1.8*   PTT 178*  More results in Results Review 42*   More results in Results Review = values in this interval not displayed.     Recent Labs   Lab 02/25/18  1255   WBC 7.10   Hgb 12.1   Hematocrit 37.2   Platelets 170     Recent Labs   Lab 02/26/18  0541 02/25/18  1255   Sodium 141 140   Potassium 5.0 4.7   Chloride 111 109   CO2 23 22   BUN 24.1* 28.8*   Creatinine 0.9 0.9   EGFR >60.0 >60.0   Glucose 99 104*   Calcium 8.5 8.9     Recent Labs   Lab 02/25/18  1255   TSH 0.96       .  Lab Results   Component Value Date    BNP 42.8 02/25/2018      Estimated Creatinine Clearance: 73.1 mL/min (based on SCr of 0.9 mg/dL).    Weight  Monitoring 02/11/2017 02/13/2017 02/14/2017 02/15/2017 02/17/2017 02/25/2018 02/25/2018   Height 152.4 cm - - - - 152.4 cm 165.1 cm   Height Method Stated - - - - Stated -   Weight 102.967 kg 105.235 kg 104.509 kg 104.055 kg 105.733 kg 105.4 kg 105.3 kg   Weight Method Standing Scale Bed Scale Standing Scale Standing Scale Standing Scale Standing Scale -   BMI (calculated) 44.4 kg/m2 - - - - 45.5 kg/m2 38.7 kg/m2         Imaging:   Radiological Procedure reviewed.              Signed by: Amedeo Gory, MD        [facility]  NP Spectralink [phone] (8am-5pm)  MD Spectralink [phone] (8am-5pm)  After hours, non urgent consult line [phone]  After Hours, urgent consults [phone]

## 2018-02-27 NOTE — Progress Notes (Signed)
[name]PITAL[facility]  Progress Note  Patient Info:   Date/Time: 02/27/2018 / 5:36 PM   Admit Date:02/25/2018  Patient Name:[name]   UEA:[id number]   PCP: Lennox Pippins, MD  Attending Physician:Aristotelis Vilardi V,*     Assessment and Plan:       --- Chest pain / angina   Serial troponins  Negative, d dimer  Negative   Discussed with Dr. Amado Nash .  Trial of Ranexa,  Increase Imdur 60mg , denies any improvement. Patient today states she held Plavix and xarelto for 3 days for steroid injection procedure to back about week ago   NTG as needed    NPO after midnight for stress test     --- Parox Afib found during Cath Dec 2019 at [facility] and has been started on Xarelto 20mg    --- History of PE in 2016, treated with coumadin for 6 months   --- History of CAD Status post stent to left anterior descending, left circumflex, RCA  Recent cardiac angiogram November 29, 2017 at [facility] showed patent stents, severe branch vessel ( Septal ,  RV marginal disease )  On Plavix.  Not taking aspirin since Xarelto has been started Oct 2019  Discussed with Dr. Amado Nash .  Continue Plavix and Xarelto.  No aspirin for now.  --- History of bradycardia Heart rate in the 50s.  --- History of HTN on Norvasc, Chlorthalidone, Toprol xl decrease to 25mg    --- History of OSA / Morbid obesity  On CPAP   --- History of GERD  On Protonix   --- History of hyperlipidemia on Lipitor   --- History of Hypothyroid on Synthroid   --- History of anxiety / Depression  On Celexa   --- History of chronic back pain has been on percocet and received steroid injection to the back about week ago and has been stable with improved pain.         DVT Prohylaxis:xarelto   Central Line/Foley Catheter/PICC line status: none  Code Status: Full Code  Disposition:home  Type of Admission:Inpatient  Expected Date of Discharge: 1-2 days  Milestones required for discharge:Clinical improvement     Hospital Problems:   Active  Problems:    Angina at rest    Subjective:   02/27/18   Complains of intermittent left-sided chest pain radiating to the arm. No improvement with the medication changes   Receiving morphine for pain     Chief Complaint:  Chest Pain    Review of Systems   Constitutional: Negative for chills and fever.   Respiratory: Negative for cough and shortness of breath.    Cardiovascular: Positive for chest pain. Negative for leg swelling.   Gastrointestinal: Negative for abdominal pain, diarrhea, nausea and vomiting.   Genitourinary: Negative for dysuria.   Neurological: Negative for dizziness, sensory change, speech change and focal weakness.     Objective:     Vitals:    02/27/18 0830 02/27/18 1257 02/27/18 1351 02/27/18 1613   BP: 130/77 116/62 143/71 116/65   Pulse: 61 62 66 65   Resp: 16 16  20    Temp: 98.4 F (36.9 C) 98.4 F (36.9 C)  97.3 F (36.3 C)   TempSrc: Oral Oral  Oral   SpO2: 93% 91% 93% 93%   Weight:       Height:         Physical Exam:   Physical Exam   Constitutional: She is oriented to person, place, and time and well-developed,  well-nourished, and in no distress.   HENT:   Head: Normocephalic and atraumatic.   Eyes: Pupils are equal, round, and reactive to light.   Cardiovascular: Normal rate and regular rhythm.   Pulmonary/Chest: Effort normal and breath sounds normal. No respiratory distress. She has no wheezes. She has no rales.   Abdominal: Soft. Bowel sounds are normal. She exhibits no distension. There is no abdominal tenderness. There is no rebound.   Musculoskeletal:         General: No edema.   Neurological: She is alert and oriented to person, place, and time. No cranial nerve deficit. GCS score is 15.     Results of Labs/imaging   Labs and radiology reports have been reviewed.    Hospitalist   Signed by:   Edwina Barth  02/27/2018 5:36 PM    *This note was generated by the Epic EMR system/ Dragon speech recognition and may contain inherent errors or omissions not intended by the  user. Grammatical errors, random word insertions, deletions, pronoun errors and incomplete sentences are occasional consequences of this technology due to software limitations. Not all errors are caught or corrected. If there are questions or concerns about the content of this note or information contained within the body of this dictation they should be addressed directly with the author for clarification

## 2018-02-28 DIAGNOSIS — I25119 Atherosclerotic heart disease of native coronary artery with unspecified angina pectoris: Principal | ICD-10-CM

## 2018-02-28 DIAGNOSIS — Z86711 Personal history of pulmonary embolism: Secondary | ICD-10-CM

## 2018-02-28 DIAGNOSIS — Z6838 Body mass index (BMI) 38.0-38.9, adult: Secondary | ICD-10-CM

## 2018-02-28 LAB — BASIC METABOLIC PANEL
Anion Gap: 11 (ref 5.0–15.0)
BUN: 20.9 mg/dL — ABNORMAL HIGH (ref 7.0–19.0)
CO2: 23 mEq/L (ref 22–29)
Calcium: 9.2 mg/dL (ref 8.5–10.5)
Chloride: 108 mEq/L (ref 100–111)
Creatinine: 0.9 mg/dL (ref 0.6–1.0)
Glucose: 98 mg/dL (ref 70–100)
Potassium: 4.8 mEq/L (ref 3.5–5.1)
Sodium: 142 mEq/L (ref 136–145)

## 2018-02-28 LAB — GFR: EGFR: >60

## 2018-02-28 LAB — MAGNESIUM: Magnesium: 1.6 mg/dL (ref 1.6–2.6)

## 2018-02-28 LAB — TROPONIN I: Troponin I: 0.01 ng/mL (ref 0.00–0.05)

## 2018-02-28 MED ORDER — ISOSORBIDE MONONITRATE ER 30 MG PO TB24
60.00 mg | ORAL_TABLET | Freq: Every day | ORAL | Status: AC
Start: 2018-02-28 — End: ?

## 2018-02-28 MED ORDER — RANOLAZINE ER 500 MG PO TB12
500.00 mg | ORAL_TABLET | Freq: Two times a day (BID) | ORAL | 0 refills | Status: AC
Start: 2018-02-28 — End: ?

## 2018-02-28 MED ORDER — METOPROLOL SUCCINATE ER 25 MG PO TB24
25.00 mg | ORAL_TABLET | Freq: Every day | ORAL | Status: AC
Start: 2018-02-28 — End: ?

## 2018-02-28 MED ORDER — CHLORTHALIDONE 25 MG PO TABS
25.00 mg | ORAL_TABLET | ORAL | Status: AC
Start: 2018-02-28 — End: ?

## 2018-02-28 NOTE — Progress Notes (Signed)
CM met with pt who was dressed and ready for [place] home. Offered PCP appt and pt declined stating she will make appt with [facility]. Stated that she doesn't need home health and has help at home. Can afford and access all precribed meds. Denies any [place] needs.     DISPO: [place] to cousin's house.    Janae Sauce, RN  Case Manager  [facility]  Ph: [phone]

## 2018-02-28 NOTE — Plan of Care (Signed)
Problem: Safety  Goal: Patient will be free from injury during hospitalization  Outcome: Progressing  Flowsheets (Taken 02/27/2018 1038 by Vella Redhead, RN)  Patient will be free from injury during hospitalization : Assess patient's risk for falls and implement fall prevention plan of care per policy;Use appropriate transfer methods;Provide and maintain safe environment;Ensure appropriate safety devices are available at the bedside;Include patient/ family/ care giver in decisions related to safety;Hourly rounding;Assess for patients risk for elopement and implement Elopement Risk Plan per policy;Provide alternative method of communication if needed (communication boards, writing)  Note:   Encouraged to call as needed. CAll bell within reach at all times.  Goal: Patient will be free from infection during hospitalization  Outcome: Progressing

## 2018-02-28 NOTE — Discharge Instr - AVS First Page (Signed)
Reason for your Hospital Admission:  Chest pain      Instructions for after your discharge:  Your Toprol dose is  decreased to 25 mg as your heart rate is running on the lower end  Imdur is increased to 60 mg daily because of chest pain  You are also added on a new medication called Ranexa to help with your chest pains.  Ranexa  Probably has some red dye but you  have been tolerating the medication in the hospital so far.  With  your history of allergy to red dye monitor for any signs of hives, tongue or swelling lip swelling, difficulty breathing.  If you notice any of the above symptoms get medical attention  Monitor your blood pressure twice daily as there are some medication changes  If the systolic blood pressure is dropping less than 100 and you are getting symptomatic with dizziness lightheadedness call your cardiologist or primary care physician immediately  With cardiologist with blood pressure readings in the next couple of days

## 2018-02-28 NOTE — Progress Notes (Signed)
[place] [facility]  PROGRESS NOTE  [facility]    Date Time: 02/28/18 9:11 AM  Patient Name: [name]       Patient Active Problem List   Diagnosis    Chest pain possibly costochondritis     Coronary artery disease    HTN (hypertension)    HLD (hyperlipidemia)    Gout    Nephrolithiasis    History of pulmonary embolism    Hypothyroid    Obesity    Depression    GERD (gastroesophageal reflux disease)    Anemia    OSA (obstructive sleep apnea)    Angina at rest       Assessment:      CAD with by report multiple stents in place since 2010, last one placed June 2019. Her last cath in Oct 2019 showed patent stents, no major epicardial disease   Chest pain, severe, but with no EKG changes or initial TnI elevation despite hours of pain   PAF noted last admission at [facility], now on Xarelto   HTN, high chol   Sinus bradycardia into upper 40s    Recommendations:    Ok for d/c   F/u with cardiology in [place]   Return for worsening symptoms.       Medications:      Scheduled Meds: PRN Meds:    allopurinol, 300 mg, Oral, QHS  amLODIPine, 10 mg, Oral, Daily  atorvastatin, 80 mg, Oral, Daily  citalopram, 20 mg, Oral, Daily  clopidogrel, 75 mg, Oral, QHS  isosorbide mononitrate, 60 mg, Oral, Daily  levothyroxine, 75 mcg, Oral, Daily at 0600  metoprolol succinate XL, 25 mg, Oral, Daily  montelukast, 10 mg, Oral, QHS  pantoprazole, 40 mg, Oral, QAM AC  polyethylene glycol, 17 g, Oral, Daily  ranolazine, 500 mg, Oral, Q12H SCH  rivaroxaban, 20 mg, Oral, Daily with dinner        Continuous Infusions:   acetaminophen, 650 mg, Q4H PRN    Or  acetaminophen, 650 mg, Q4H PRN  naloxone, 0.2 mg, PRN  nitroglycerin, 0.4 mg, Q5 Min PRN  ondansetron, 4 mg, Q8H PRN  oxyCODONE-acetaminophen, 1 tablet, Q8H PRN              Subjective:   Denies chest pain, SOB or palpitations.cp much better with pain meds.       Physical Exam:     Vitals:    02/28/18 0846   BP: 105/52   Pulse: (!) 49   Resp: 20   Temp:  97.7 F (36.5 C)   SpO2: 97%     Temp (24hrs), Avg:97.8 F (36.6 C), Min:97.3 F (36.3 C), Max:98.4 F (36.9 C)      Telemetry reviewed no changes.nsr     Intake and Output Summary (Last 24 hours) at Date Time  No intake or output data in the 24 hours ending 02/28/18 0911    General Appearance:  Breathing comfortable, no acute distress    Neck:  No carotid bruit or jugular venous distension, brisk carotid upstroke  Lungs:  Clear to auscultation throughout, no wheezes, rhonchi or rales, good respiratory effort   Heart:  S1, S2 normal, no S3, no S4, no murmur, PMI not displaced, no rub   Abdomen:  Soft, non-tender, positive bowel sounds, no hepatojugular reflux  Extremities:  No cyanosis, clubbing or edema      Labs:     Recent Labs   Lab 02/28/18  0358 02/26/18  0910 02/25/18  1807  02/25/18  1255   Creatine Kinase (CK)  --   --   --   --  25*   Troponin I 0.01 0.01 <0.01  More results in Results Review 0.01   More results in Results Review = values in this interval not displayed.             Recent Labs   Lab 02/25/18  1255   Bilirubin, Total 0.7   Protein, Total 6.6   Albumin 3.9   ALT 28   AST (SGOT) 17     Recent Labs   Lab 02/28/18  0358   Magnesium 1.6     Recent Labs   Lab 02/26/18  0428  02/25/18  1255   PT  --   --  21.2*   PT INR  --   --  1.8*   PTT 178*  More results in Results Review 42*   More results in Results Review = values in this interval not displayed.     Recent Labs   Lab 02/25/18  1255   WBC 7.10   Hgb 12.1   Hematocrit 37.2   Platelets 170     Recent Labs   Lab 02/28/18  0358 02/26/18  0541 02/25/18  1255   Sodium 142 141 140   Potassium 4.8 5.0 4.7   Chloride 108 111 109   CO2 23 23 22    BUN 20.9* 24.1* 28.8*   Creatinine 0.9 0.9 0.9   EGFR >60.0 >60.0 >60.0   Glucose 98 99 104*   Calcium 9.2 8.5 8.9     Recent Labs   Lab 02/25/18  1255   TSH 0.96       .  Lab Results   Component Value Date    BNP 42.8 02/25/2018      Estimated Creatinine Clearance: 73.1 mL/min (based on SCr of 0.9  mg/dL).    Weight Monitoring 02/11/2017 02/13/2017 02/14/2017 02/15/2017 02/17/2017 02/25/2018 02/25/2018   Height 152.4 cm - - - - 152.4 cm 165.1 cm   Height Method Stated - - - - Stated -   Weight 102.967 kg 105.235 kg 104.509 kg 104.055 kg 105.733 kg 105.4 kg 105.3 kg   Weight Method Standing Scale Bed Scale Standing Scale Standing Scale Standing Scale Standing Scale -   BMI (calculated) 44.4 kg/m2 - - - - 45.5 kg/m2 38.7 kg/m2               Signed by: Marian Sorrow, MD        [facility]  NP Spectralink [phone] (8am-5pm)  MD Spectralink [phone] (8am-5pm)  After hours, non urgent consult line [phone]  After Hours, urgent consults [phone]

## 2018-02-28 NOTE — Discharge Summary -  Nursing (Signed)
Discharged home as ordered. Discharge AVS reviewed with patient, verbalized understanding. IV access and telemetry removed. Wheeled to the [place] entrance by PCT.

## 2018-02-28 NOTE — Discharge Instructions (Signed)
Discharge Instructions for Angina   You have been diagnosed with a type of chest pain called angina. Angina occurs when your heart muscle doesn't get enough oxygen. It's most often felt under your breastbone, in your left shoulder, or down your left arm. The pain may even spread to your jaw or back. Exercise, increased activity, emotional upset, or stress can trigger this pain. With proper treatment and lifestyle changes to reduce risk factors, most people with angina are able to maintain a full and active life.   Managing risk factors   Your healthcare provider will work with you to make lifestyle changes as needed. This can help prevent worsening of coronary artery disease, which is likely the cause of your angina.   Coronary artery disease is a narrowing of the blood vessels that supply oxygen and nutrients to the heart muscle. The blood vessels can also spasm and reduce the oxygen reaching the heart muscle from the narrowing inside of the artery. Managing your risk factors may prevent both of these causes of narrowing of your arteries.   Diet  Your healthcare provider will give you information on dietary changes that you may need to make, based on your situation. Your provider may recommend that you see a registered dietitian for help with diet changes. Try these changes to start:     · Eat less fat and cholesterol  · Eat less salt (sodium), especially if you have high blood pressure   · Eat more fresh vegetables and fruits   · Eat lean proteins, such as fish, poultry, and legumes (beans and peas), and eat less red meat and processed meats  · Use low-fat dairy products   · Use vegetable and nut oils in limited amounts   · Limit sweets and processed foods such as chips, cookies, and baked goods  · Limit sodas and high calorie drinks  · Limit greasy and fried foods, or those high in saturated fat  · Limit alcohol intake  Physical activity   Your healthcare provider may recommend that you increase your physical  activity if you have not been as active as possible. This may include moderate to vigorous intensity physical activity for at least 30 to 60 minutes each day for at least 5 to 7 days per week. A few examples of moderate to vigorous intensity physical activity include:    · Walking at a brisk pace, about 3 to 4 miles per hour  · Jogging or running  · Swimming or water aerobics  · Hiking  · Dancing  · Martial arts  · Tennis  · Riding a bike  Don't start or increase your activity level without first seeing your healthcare provider.   Weight management   If you are overweight, your healthcare provider will work with you to lose weight and lower your BMI (body mass index) to a normal or near-normal level. Making diet changes and increasing physical activity can help. A healthy and reasonable goal for weight loss is to lose 10% of your current weight per year.   Smoking  If you smoke or use other tobacco products including chewing tobacco or electronic cigarettes (vaping), get help to quit. Enroll in a stop-smoking program to improve your chances of success. You can also join a support group. Talk to your healthcare provider about nicotine replacement products or medicines to help you quit.   Stress  Learn ways to manage stress to help you deal with stress in your home and work life. Your ability to prioritize your health depends on your   mental health and focus. Feeling supported in the rest of your life is key to achieving success with your health.   Managing medicines    Keep a record of your episodes of chest pain. Take these with you when you see your healthcare provider.   Take your medicines exactly as directed. Dont skip doses. If you miss a dose, call your healthcare provider right away.   If you have unwanted side effects from your medicine, tell your healthcare provider right away.    Taking nitroglycerin    Keep your nitroglycerin with you at all times.   If youre on nitroglycerin, dont take medicines  used to treat erectile dysfunction such as sildenafil or tadalafil at all. These can react with nitroglycerin and cause your blood pressure to drop to a dangerous or even life-threatening level.   If you use nitroglycerin to prevent angina attacks, follow your healthcare providers instructions for your kind of nitroglycerin (pill, spray,or skin patch).   If you use nitroglycerin to stop an angina attack, follow these steps:  ? Sit down, because you may become dizzy.  ? Put1 tablet under your tongue, or between your lip and gum, or between your cheek and gum. Let the tablet dissolve completely. Don't chew or swallow the tablet.  ? If you use a spray, then spray once on orunder your tongue. Don't inhale. Close your mouth. Wait a few seconds before you swallow and don't rinse your mouth for5 to 10 minutes.  ? After taking1 tablet or spraying once, continue sitting for 5 minutes to make sure you feel well enough to stand up.  ? If the angina goes away completely, rest awhile and follow your provider's orders about returning to your normal routine.  ? If thechest pain or pressurecontinues, call911 right away. Don't delay. You may be having a heart attack (acute myocardial infarction, or AMI)! Don't drive yourself to the hospital if you think you are having a heart attack as this poses a risk to yourself and other drivers on the road-- take an ambulance.  ? You may be told by your provider to call911after taking 2 or 3 tables or sprays of nitroglycerin (spaced 5 minutes apart) and the chest pain or pressure is still present 5 minutes after the last dose. Don't take more than 3 tablets, or spray more than 3 times, within 15 minutes.    Call 911  This is the fastest and safest way to get to the nearest emergency department. The paramedics can also start treatment on the way to the hospital, saving valuable time for your heart.    If angina gets worse, it continues, or if it stops and returns, call 911 right  away. Don't delay. You may be having a heart attack.  Don't wait until your symptoms are severe to call 911. Other reasons to call 911 besides chest pain include:    Trouble breathing   Feeling lightheaded, faint, or dizzy   Rapid heart beat   Slower than usual heart rate compared to your normal   Angina with weakness, dizziness, fainting, heavy sweating, nausea, or vomiting   Extreme drowsiness, or confusion   Weakness of an arm or leg or on one side of the face   Trouble with speech or vision  [employer] last reviewed this educational content on 08/08/2017   2000-2019 [employer]. [address], [place] [address]. All rights reserved. This information is not intended as a substitute for professional medical care.  Always follow your healthcare professional's instructions.        Ranolazine tablets, extended release  Brand Name: Ranexa  What is this medicine?  RANOLAZINE (ra NOE la zeen) is a heart medicine. It is used to treat chronic chest pain (angina). This medicine must be taken regularly. It will not relieve an acute episode of chest pain.  How should I use this medicine?  Take this medicine by mouth with a glass of water. Follow the directions on the prescription label. Do not cut, crush, or chew this medicine. Take with or without food. Do not take this medication with grapefruit juice. Take your doses at regular intervals. Do not take your medicine more often then directed.  Talk to your pediatrician regarding the use of this medicine in children. Special care may be needed.  What side effects may I notice from receiving this medicine?  Side effects that you should report to your doctor or health care professional as soon as possible:   allergic reactions like skin rash, itching or hives, swelling of the face, lips, or tongue   breathing problems   changes in vision   fast, irregular or pounding heartbeat   feeling faint or lightheaded, falls   low or high blood  pressure   numbness or tingling feelings   ringing in the ears   tremor or shakiness   slow heartbeat (fewer than 50 beats per minute)   swelling of the legs or feet  Side effects that usually do not require medical attention (report to your doctor or health care professional if they continue or are bothersome):   constipation   drowsy   dry mouth   headache   nausea or vomiting   stomach upset  What may interact with this medicine?  Do not take this medicine with any of the following medications:   antivirals for HIV or AIDS   cerivastatin   certain antibiotics like chloramphenicol, clarithromycin, dalfopristin; quinupristin, isoniazid, rifabutin, rifampin, rifapentine   certain medicines used for cancer like imatinib, nilotinib   certain medicines for fungal infections like fluconazole, itraconazole, ketoconazole, posaconazole, voriconazole   certain medicines for irregular heart beat like dofetilide, dronedarone   certain medicines for seizures like carbamazepine, fosphenytoin, oxcarbazepine, phenobarbital, phenytoin   cisapride   conivaptan   cyclosporine   grapefruit or grapefruit juice   lumacaftor; ivacaftor   nefazodone   pimozide   quinacrine   St John's wort   thioridazine   ziprasidone  This medicine may also interact with the following medications:   alfuzosin   certain medicines for depression, anxiety, or psychotic disturbances like bupropion, citalopram, fluoxetine, fluphenazine, paroxetine, perphenazine, risperidone, sertraline, trifluoperazine   certain medicines for cholesterol like atorvastatin, lovastatin, simvastatin   certain medicines for stomach problems like octreotide, palonosetron, prochlorperazine   eplerenone   ergot alkaloids like dihydroergotamine, ergonovine, ergotamine, methylergonovine   metformin   nicardipine   other medicines that prolong the QT interval (cause an abnormal heart rhythm)   sirolimus   tacrolimus  What if I miss a dose?  If  you miss a dose, take it as soon as you can. If it is almost time for your next dose, take only that dose. Do not take double or extra doses.  Where should I keep my medicine?  Keep out of the reach of children.  Store at room temperature between 15 and 30 degrees C (59 and 86 degrees F). Throw away any unused medicine after the expiration date.  What  should I tell my health care provider before I take this medicine?  They need to know if you have any of these conditions:   heart disease   irregular heartbeat   kidney disease   liver disease   low levels of potassium or magnesium in the blood   an unusual or allergic reaction to ranolazine, other medicines, foods, dyes, or preservatives   pregnant or trying to get pregnant   breast-feeding  What should I watch for while using this medicine?  Visit your doctor for regular check ups. Tell your doctor or healthcare professional if your symptoms do not start to get better or if they get worse. This medicine will not relieve an acute attack of angina or chest pain.  This medicine can change your heart rhythm. Your health care provider may check your heart rhythm by ordering an electrocardiogram (ECG) while you are taking this medicine.  You may get drowsy or dizzy. Do not drive, use machinery, or do anything that needs mental alertness until you know how this medicine affects you. Do not stand or sit up quickly, especially if you are an older patient. This reduces the risk of dizzy or fainting spells. Alcohol may interfere with the effect of this medicine. Avoid alcoholic drinks.  If you are scheduled for any medical or dental procedure, tell your healthcare provider that you are taking this medicine. This medicine can interact with other medicines used during surgery.  NOTE:This sheet is a summary. It may not cover all possible information. If you have questions about this medicine, talk to your doctor, pharmacist, or health care provider. Copyright 2019  [employer]

## 2018-02-28 NOTE — Discharge Summary (Signed)
[name] HOSPITALIST    Summary   Patient Info:   Date/Time: 02/28/2018 / 11:11 AM   Admit Date:02/25/2018  Patient Name:[name]   ZOX:[id number]   PCP: Lennox Pippins, MD  Attending Physician:Yichen Gilardi, Ivar Drape, MD     Hospital Course:   Please see H&P for complete details of HPI and ROS. The patient was admitted to [facility] and has been taken care as mentioned below.    --- Chest pain/ angina  Serial troponins  Negative, d dimer  Negative   Trial of Ranexa,  Increase Imdur 60mg  Toprol dose decreased to 25 mg because of bradycardia  Patient  Stated to Dr.Vallabahneni  she held Plavix and xarelto for 3 days for steroid injection procedure to back about week ago   Overall patient is slowly improving.  Will to ambulate to the bathroom without much difficulty. Not Hypoxic , no respiratory distress  Was evaluated by Dr. Gaylyn Lambert recommends discharge home and outpatient follow-up with her primary cardiologist at [facility].    --- Parox Afib found during Cath Dec 2019 at [facility] and has been started on Xarelto 20mg    --- History of PE in 2016, treated with coumadin for 6 months .  Now on Xarelto   --- History of CADStatus post stent to left anterior descending, left circumflex, RCA  Recent cardiac angiogram November 29, 2017 at [facility] showed patent stents, severe branch vessel ( Septal , RV marginal disease )  On Plavix. Not taking aspirin since Xarelto has been started Oct 2019  Per  Dr. Amado Nash .  Continue Plavix and Xarelto.  No aspirin for now.  --- History of bradycardia Heart rate in the 50s.  --- History of HTNon Norvasc, Chlorthalidone per patient QOD, Toprol xl decrease to 25mg    --- History of OSA / Morbid obesity On CPAP   --- History ofGERD On Protonix   --- History of hyperlipidemia on Lipitor   --- History of Hypothyroid on Synthroid   --- History of anxiety / Depression On Celexa   --- History of chronic back pain has been on  percocet and received steroid injection to the back about week ago and has been stable with improved pain.     Discharge instructions provided to patient at length she understands and agrees  Your Toprol dose is  decreased to 25 mg as your heart rate is running on the lower end  Imdur is increased to 60 mg daily because of chest pain  You are also added on a new medication called Ranexa to help with your chest pains.  Ranexa  Probably has some red dye but you  have been tolerating the medication in the hospital so far.  With  your history of allergy to red dye monitor for any signs of hives, tongue or swelling lip swelling, difficulty breathing.  If you notice any of the above symptoms get medical attention  Monitor your blood pressure twice daily as there are some medication changes  If the systolic blood pressure is dropping less than 100 and you are getting symptomatic with dizziness lightheadedness call your cardiologist or primary care physician immediately  With cardiologist with blood pressure readings in the next couple of days    Admission Date:02/25/2018  Discharge Date: 02/28/18  Type of Admission:Inpatient  Code Status: Full Code  Subjective at the time of discharge:   States overall chest pain is improving  aBLE  to ambulate without much difficulty denies any nausea vomiting  abdominal pain shortness of breath  Chief Complaint:  Chest Pain    Objective:     Vitals:    02/27/18 2057 02/28/18 0100 02/28/18 0539 02/28/18 0846   BP: 132/57 125/76 124/68 105/52   Pulse: (!) 57 62 (!) 49 (!) 49   Resp: 16 16 16 20    Temp: 98.1 F (36.7 C) 97.6 F (36.4 C) 97.7 F (36.5 C) 97.7 F (36.5 C)   TempSrc: Oral Temporal Oral Oral   SpO2: 94% 96% 95% 97%   Weight:       Height:         Physical examination  General comfortable , obese  In no acute distress talking full sentences  Pupils equal and react extraocular was intact  Neck supple no JVD  Lungs clear to auscultation no wheezing rales or rhonchi   Cardiovascular regular rate and rhythm  Abdomen soft nontender no guarding discharge rebound  Extremities no significant edema or calf tenderness  Clinical Presentation:   History of Presenting Illness: Please refer to HPI in the Detailed H&P  Consultants   Consultants:Plan  None  Discharge Medications:   Discharge Medications:     Discharge Medication List      Taking    allopurinol 300 MG tablet  Dose:  300 mg  Commonly known as:  ZYLOPRIM  Take 300 mg by mouth daily      amLODIPine 10 MG tablet  Dose:  10 mg  Commonly known as:  NORVASC  Take 10 mg by mouth daily.     atorvastatin 80 MG tablet  Dose:  80 mg  Commonly known as:  LIPITOR  Take 80 mg by mouth daily.     chlorthalidone 25 MG tablet  Dose:  25 mg  What changed:  when to take this  Commonly known as:  HYGROTEN  Take 1 tablet (25 mg total) by mouth every other day  Notes to patient:  RESUME     citalopram 40 MG tablet  Dose:  40 mg  Commonly known as:  CeleXA  Take 40 mg by mouth daily.     clopidogrel 75 mg tablet  Dose:  75 mg  Commonly known as:  PLAVIX  Take 75 mg by mouth nightly.      isosorbide mononitrate 30 MG 24 hr tablet  Dose:  60 mg  What changed:  how much to take  Commonly known as:  IMDUR  Take 2 tablets (60 mg total) by mouth daily     levothyroxine 75 MCG tablet  Dose:  75 mcg  Commonly known as:  SYNTHROID, LEVOTHROID  Take 75 mcg by mouth Once a day at 6:00am.     lisinopril 10 MG tablet  Dose:  10 mg  Commonly known as:  PRINIVIL,ZESTRIL  Take 10 mg by mouth daily     metoprolol succinate XL 25 MG 24 hr tablet  Dose:  25 mg  Commonly known as:  TOPROL-XL  Take 1 tablet (25 mg total) by mouth daily     montelukast 10 MG tablet  Dose:  10 mg  Commonly known as:  SINGULAIR  Take 10 mg by mouth nightly.     oxyCODONE 5 MG immediate release tablet  Dose:  5 mg  Commonly known as:  ROXICODONE  Take 1 tablet (5 mg total) by mouth every 4 (four) hours as needed for Pain.     pantoprazole 40 MG tablet  Dose:  40 mg  Commonly known as:  PROTONIX  Take 40 mg by mouth daily.     PROAIR HFA 108 (90 Base) MCG/ACT inhaler  Dose:  2 puff  Generic drug:  albuterol  2 puffs every 6 (six) hours as needed.     ranolazine 500 MG 12 hr tablet  Dose:  500 mg  Commonly known as:  RANEXA  Take 1 tablet (500 mg total) by mouth every 12 (twelve) hours History of allergy to red dye but tolerated Renexa in the hospital     XARELTO 10 MG Tabs  Dose:  20 mg  Generic drug:  rivaroxaban  Take 20 mg by mouth daily with dinner         STOP taking these medications    aspirin EC 81 MG EC tablet          Follow up recommendations:   Follow up:   Follow-up Information     pcp Follow up in 1 week(s).           cardiologist Follow up in 2 day(s).    Why:  follow up in 2-3 days with blood pressure readings                Results of Labs/imaging:   Labs have been reviewed:   Coagulation Profile:   Recent Labs   Lab 02/26/18  0428  02/25/18  1255   PT  --   --  21.2*   PT INR  --   --  1.8*   PTT 178*  More results in Results Review 42*   More results in Results Review = values in this interval not displayed.       CBC review:   Recent Labs   Lab 02/25/18  1255   WBC 7.10   Hgb 12.1   Hematocrit 37.2   Platelets 170   MCV 96.1*   RDW 13   Neutrophils 65.1   Lymphocytes Automated 22.5   Eosinophils Automated 1.4   Immature Granulocyte 0.3   Neutrophils Absolute 4.62   Absolute Immature Granulocyte 0.02     Chem Review:  Recent Labs   Lab 02/28/18  0358 02/26/18  0541 02/25/18  1255   Sodium 142 141 140   Potassium 4.8 5.0 4.7   Chloride 108 111 109   CO2 23 23 22    BUN 20.9* 24.1* 28.8*   Creatinine 0.9 0.9 0.9   Glucose 98 99 104*   Calcium 9.2 8.5 8.9   Magnesium 1.6 1.7 1.8   Bilirubin, Total  --   --  0.7   AST (SGOT)  --   --  17   ALT  --   --  28   Alkaline Phosphatase  --   --  73     Results     Procedure Component Value Units Date/Time    Troponin I [[id number]] Collected:  02/28/18 0358    Specimen:  Blood Updated:  02/28/18 0511     Troponin I 0.01 ng/mL     Magnesium  [[id number]] Collected:  02/28/18 0358    Specimen:  Blood Updated:  02/28/18 0510     Magnesium 1.6 mg/dL     GFR [[id number]] Collected:  02/28/18 0358     Updated:  02/28/18 0510     EGFR >60.0    Basic Metabolic Panel [[id number]]  (Abnormal) Collected:  02/28/18 0358    Specimen:  Blood Updated:  02/28/18 0510     Glucose 98 mg/dL      BUN  20.9 mg/dL      Creatinine 0.9 mg/dL      Calcium 9.2 mg/dL      Sodium 409 mEq/L      Potassium 4.8 mEq/L      Chloride 108 mEq/L      CO2 23 mEq/L      Anion Gap 11.0        Radiology reports have been reviewed:  Radiology Results (24 Hour)     ** No results found for the last 24 hours. **        Xr Chest Ap Portable    Result Date: 02/25/2018  Clinical History: Chest pain. Findings: AP view of the chest. Compared with prior studies including 02/14/2017. Stable prominent heart, magnified by AP technique. Pulmonary vascularity is normal. Stable mild elevation of the right hemidiaphragm. Lungs are again hypoinflated. Mild patchy bibasilar airspace disease appears stable and may represent atelectasis, but other superimposed airspace process cannot be excluded.      Stable prominent heart and mild bibasilar airspace disease. Darra Lis, MD 02/25/2018 2:01 PM    Pathology:   Specimens (From admission, onward)    None        Pending Lab Results:   Labs/Images to be followed at your PCP office:   Unresulted Labs     None         Hospitalist:   Signed by: Harlon Ditty  02/28/2018 11:11 AM      *This note was generated by the [employer] EMR system/ Dragon speech recognition and may contain inherent errors or omissions not intended by the user. Grammatical errors, random word insertions, deletions, pronoun errors and incomplete sentences are occasional consequences of this technology due to software limitations. Not all errors are caught or corrected. If there are questions or concerns about the content of this note or information contained within the body of this dictation they  should be addressed directly with the author for clarification

## 2018-07-03 ENCOUNTER — Observation Stay
Admission: EM | Admit: 2018-07-03 | Discharge: 2018-07-04 | Disposition: A | Discharge status: Home / Self Care | Payer: Medicare Other | Attending: Internal Medicine | Admitting: Internal Medicine

## 2018-07-03 ENCOUNTER — Emergency Department: Payer: Medicare Other

## 2018-07-03 DIAGNOSIS — I2510 Atherosclerotic heart disease of native coronary artery without angina pectoris: Secondary | ICD-10-CM | POA: Diagnosis present

## 2018-07-03 DIAGNOSIS — Z9989 Dependence on other enabling machines and devices: Secondary | ICD-10-CM

## 2018-07-03 DIAGNOSIS — I10 Essential (primary) hypertension: Secondary | ICD-10-CM | POA: Insufficient documentation

## 2018-07-03 DIAGNOSIS — K449 Diaphragmatic hernia without obstruction or gangrene: Secondary | ICD-10-CM | POA: Insufficient documentation

## 2018-07-03 DIAGNOSIS — Z7982 Long term (current) use of aspirin: Secondary | ICD-10-CM | POA: Insufficient documentation

## 2018-07-03 DIAGNOSIS — I472 Ventricular tachycardia: Secondary | ICD-10-CM | POA: Insufficient documentation

## 2018-07-03 DIAGNOSIS — R42 Dizziness and giddiness: Secondary | ICD-10-CM | POA: Insufficient documentation

## 2018-07-03 DIAGNOSIS — M109 Gout, unspecified: Secondary | ICD-10-CM | POA: Insufficient documentation

## 2018-07-03 DIAGNOSIS — I252 Old myocardial infarction: Secondary | ICD-10-CM | POA: Insufficient documentation

## 2018-07-03 DIAGNOSIS — R110 Nausea: Secondary | ICD-10-CM | POA: Insufficient documentation

## 2018-07-03 DIAGNOSIS — Z6839 Body mass index (BMI) 39.0-39.9, adult: Secondary | ICD-10-CM | POA: Insufficient documentation

## 2018-07-03 DIAGNOSIS — I25110 Atherosclerotic heart disease of native coronary artery with unstable angina pectoris: Secondary | ICD-10-CM | POA: Insufficient documentation

## 2018-07-03 DIAGNOSIS — F32A Depression, unspecified: Secondary | ICD-10-CM | POA: Diagnosis present

## 2018-07-03 DIAGNOSIS — Z955 Presence of coronary angioplasty implant and graft: Secondary | ICD-10-CM | POA: Insufficient documentation

## 2018-07-03 DIAGNOSIS — Z7902 Long term (current) use of antithrombotics/antiplatelets: Secondary | ICD-10-CM | POA: Insufficient documentation

## 2018-07-03 DIAGNOSIS — Z8249 Family history of ischemic heart disease and other diseases of the circulatory system: Secondary | ICD-10-CM | POA: Insufficient documentation

## 2018-07-03 DIAGNOSIS — D539 Nutritional anemia, unspecified: Secondary | ICD-10-CM | POA: Insufficient documentation

## 2018-07-03 DIAGNOSIS — K219 Gastro-esophageal reflux disease without esophagitis: Secondary | ICD-10-CM | POA: Insufficient documentation

## 2018-07-03 DIAGNOSIS — Z86711 Personal history of pulmonary embolism: Secondary | ICD-10-CM | POA: Insufficient documentation

## 2018-07-03 DIAGNOSIS — E782 Mixed hyperlipidemia: Secondary | ICD-10-CM | POA: Insufficient documentation

## 2018-07-03 DIAGNOSIS — R079 Chest pain, unspecified: Principal | ICD-10-CM | POA: Insufficient documentation

## 2018-07-03 DIAGNOSIS — R7303 Prediabetes: Secondary | ICD-10-CM | POA: Insufficient documentation

## 2018-07-03 DIAGNOSIS — E669 Obesity, unspecified: Secondary | ICD-10-CM | POA: Insufficient documentation

## 2018-07-03 DIAGNOSIS — R002 Palpitations: Secondary | ICD-10-CM | POA: Insufficient documentation

## 2018-07-03 DIAGNOSIS — R61 Generalized hyperhidrosis: Secondary | ICD-10-CM | POA: Insufficient documentation

## 2018-07-03 DIAGNOSIS — E039 Hypothyroidism, unspecified: Secondary | ICD-10-CM | POA: Insufficient documentation

## 2018-07-03 DIAGNOSIS — Z7901 Long term (current) use of anticoagulants: Secondary | ICD-10-CM | POA: Insufficient documentation

## 2018-07-03 DIAGNOSIS — I480 Paroxysmal atrial fibrillation: Secondary | ICD-10-CM | POA: Insufficient documentation

## 2018-07-03 DIAGNOSIS — R0602 Shortness of breath: Secondary | ICD-10-CM | POA: Insufficient documentation

## 2018-07-03 DIAGNOSIS — G4733 Obstructive sleep apnea (adult) (pediatric): Secondary | ICD-10-CM | POA: Insufficient documentation

## 2018-07-03 MED ORDER — NITROGLYCERIN 0.4 MG SL SUBL
0.4000 mg | SUBLINGUAL_TABLET | SUBLINGUAL | Status: DC | PRN
Start: 2018-07-03 — End: 2018-07-04
  Administered 2018-07-03 – 2018-07-04 (×2): 0.4 mg via SUBLINGUAL
  Filled 2018-07-03 (×2): qty 1

## 2018-07-03 MED ORDER — SODIUM CHLORIDE 0.9 % IV BOLUS
500.00 mL | Freq: Once | INTRAVENOUS | Status: AC
Start: 2018-07-03 — End: 2018-07-04
  Administered 2018-07-04: 500 mL via INTRAVENOUS

## 2018-07-03 MED ORDER — ONDANSETRON HCL 4 MG/2ML IJ SOLN
4.00 mg | Freq: Once | INTRAMUSCULAR | Status: AC
Start: 2018-07-03 — End: 2018-07-04
  Administered 2018-07-04: 4 mg via INTRAVENOUS
  Filled 2018-07-03: qty 2

## 2018-07-03 NOTE — EDIE (Signed)
COLLECTIVE?NOTIFICATION?07/03/2018 22:47?Weeks, [name]?MRN: [id number]    [name]'s patient encounter information:   ION:?[id number]  Account [id number]  Billing Account [id number]      Criteria Met      5 ED Visits in 12 Months    Security and Safety  No recent Security Events currently on file    ED Care Guidelines  There are currently no ED Care Guidelines for this patient. Please check your facility's medical records system.        Prescription Monitoring Program  351??- Narcotic Use Score  180??- Sedative Use Score  000??- Stimulant Use Score  290??- Overdose Risk Score  - All Scores range from 000-999 with 75% of the population scoring < 200 and on 1% scoring above 650  - The last digit of the narcotic, sedative, and stimulant score indicates the number of active prescriptions of that type  - Higher Use scores correlate with increased prescribers, pharmacies, mg equiv, and overlapping prescriptions  - Higher Overdose Risk Scores correlate with increased risk of unintentional overdose death   Concerning or unexpectedly high scores should prompt a review of the PMP record; this does not constitute checking PMP for prescribing purposes.      E.D. Visit Count (12 mo.)  Facility Visits   [facility] 1   [facility] 6   [place] [facility] 2   [facility] 4   Total 13   Note: Visits indicate total known visits.      Recent Emergency Department Visit Summary  Showing 10 most recent visits out of 13 in the past 12 months  Date Facility [facility] Type Diagnoses or Chief Complaint   Jul 03, 2018 [place] [facility] [place] [place] [facility]      chest pain      Apr 01, 2018 [facility] [place] [place] [facility]     Mar 09, 2018 [facility] [place] [place] [place] [facility]      chest pain      Chest Pain      Chest pain, unspecified      Feb 25, 2018 [place] [facility] [place] [place] [facility]      BAd  chest pain      Chest Pain      Chest pain, unspecified      Dec 29, 2017 [facility] [place] [place] [facility]  Chief Complaint: MED 1 CHEST PAIN    Dec 22, 2017 [facility] [place] [place] [place] [facility]      chest pain, shortness of breath      Chest Pain      Chest pain, unspecified      Dec 14, 2017 [facility] [place] [place] [facility]      Gastro-esophageal reflux disease without esophagitis      Personal history of pulmonary embolism      Allergy to other foods      Chest pain, unspecified      Long term (current) use of aspirin      Allergy status to other drugs, medicaments and biological sub      Cough      Allergy status to narcotic agent status      Old myocardial infarction      Major depressive disorder, single episode, unspecified      Dec 08, 2017 [facility] [place] [facility]  Other long term (current) drug therapy      Personal history of urinary calculi      Radiographic dye allergy status      Gastro-esophageal reflux disease without esophagitis      Major depressive disorder, single episode, unspecified      Hyperlipidemia, unspecified      Allergy status to serum and vaccine status      Hypothyroidism, unspecified      Allergy to other foods      Old myocardial infarction      Nov 25, 2017 [facility] [place] [place] [place] [facility]      CHEST PAIN      Shortness of Breath      Chest Pain      Oct 30, 2017 [facility] [place] [place] [facility]  Chief Complaint: PT STS CHEST PAINS RADIATES TO BACK        Recent Inpatient Visit Summary  Date Facility [facility] Type Diagnoses or Chief Complaint   May 20, 2018 [facility] [place] [place] [facility]      CHEST PAIN      Apr 01, 2018 [facility] [place]      Chest pain, unspecified      Presence of right artificial knee joint      Body mass index (BMI) 45.0-49.9, adult      Presence of coronary angioplasty implant and  graft      Old myocardial infarction      Morbid (severe) obesity due to excess calories      Long term (current) use of anticoagulants      Hormone replacement therapy      Other chest pain      Personal history of pulmonary embolism      Feb 25, 2018 [place] [facility] [place]      Chest pain, unspecified      Dec 29, 2017 [facility] [place] [place] [facility]      Other nonmedicinal substance allergy status      Hyperlipidemia, unspecified      Syncope and collapse      Allergy status to analgesic agent status      Atherosclerotic heart disease of native coronary artery witho      Allergy status to narcotic agent status      Unspecified hemorrhoids      Presence of coronary angioplasty implant and graft      Hypothyroidism, unspecified      Gastro-esophageal reflux disease without esophagitis      Nov 27, 2017 [facility] [place] [place] [facility]      Chest Pain      Chest pain, unspecified      Nov 25, 2017 [facility] [place] [place] [place] [facility]     Oct 31, 2017 [facility] [place] 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 [place] [facility]      Personal history of pulmonary embolism      Gout, unspecified      Allergy to other foods      Hypothyroidism, unspecified      Presence of coronary angioplasty implant and graft      Personal history of transient ischemic attack (TIA), and cere       Long term (current) use of antithrombotics/antiplatelets      Obstructive sleep apnea (adult) (pediatric)      Gastro-esophageal reflux disease without esophagitis      Long term (current) use of aspirin          Care Team  There is not a care team on record at this time.   Collective Portal  This patient has registered at the [facility] Emergency Department   For more information visit: [email or web address]     PLEASE NOTE:     1.   Any care recommendations and other clinical information are provided as guidelines or for historical purposes only, and providers should exercise their own clinical judgment when providing care.    2.   You may only use this information for purposes of treatment, payment or health care operations activities, and subject to the limitations of applicable Collective Policies.    3.   You should consult directly with the organization that provided a care guideline or other clinical history with any questions about additional information or accuracy or completeness of information provided.    ? 2020 [place], [employer] - [email or web address]

## 2018-07-03 NOTE — ED Provider Notes (Signed)
EMERGENCY DEPARTMENT NOTE    None        HISTORY OF PRESENT ILLNESS   Historian:Patient  Translator Used: No    Chief Complaint:   Chief Complaint   Patient presents with    Chest Pain      Mechanism of Injury:       68 year old female, history of CAD, 11 stents, paroxysmal A. fib, PE, on Xarelto, Plavix, HTN, presenting with concern for having chest pain, palpitations.  Patient states that she has been feeling unwell throughout the day.  She reports some generalized fatigue throughout the day.  Approximately an hour prior to presentation, she developed some heaviness in her chest with radiation of pain down her right arm.  She had associated dizziness, shortness of breath, diaphoresis and nausea.  Pt had similar symptoms with previous MI.  Last had stent placed last year.  Her primary cardiologist is in [place].  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  Pt denies any exertional activities to cause the pain.  Pt given 324 mg ASA en route with EMS.  She had minimal improvement with chest pain.  Currently rates her pain at a 6.  She has increased pain with movement and exertional activities.      HPI            MEDICAL HISTORY     Past Medical History:  Past Medical History:   Diagnosis Date    Anxiety and depression     CAD in native artery     "11 stents"    Carpal tunnel syndrome 05/30/2007    Cervical myelopathy 05/07/2009    Cervical spondylosis 07/30/2009    Chronic anemia     Chronic cough 01/04/2017    Degeneration of lumbar or lumbosacral intervertebral disc 05/29/2007    Encounter for blood transfusion     Essential hypertension     GERD without esophagitis     Gout 10/07/2012    H/O right coronary artery stent placement 08/19/2011    Hemorrhoids 01/04/2017    Hiatal hernia 09/20/2017    History of pertussis     Hypothyroidism, unspecified type     02/25/2018 TSH 0.96, free T4 1.10 (both normal).    Influenza vaccination given 11/29/2017    Lumbar disc herniation  07/10/2007    Lumbar spondylolysis 07/30/2009    Migraine with prolonged aura     Migraine without aura and without status migrainosus, not intractable 07/17/2015    Mixed hyperlipidemia     05/21/2018 lipid panel: Chol 147, TG 151 (H), HDL 35 (L), LDL 82.    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    Unstable angina 08/19/2015    Ventricular tachycardia 12/30/2017    Xerostomia 01/04/2017       Past Surgical History:  Past Surgical History:  Procedure Laterality Date    ABDOMINAL MASS RESECTION      CHOLECYSTECTOMY      CORONARY ANGIOPLASTY WITH STENT PLACEMENT  07/29/2014    LAD DES (Xience) 2.25 x 15 mm      CORONARY ANGIOPLASTY WITH STENT PLACEMENT  01/13/2017    3 mm x 20 mm drug-eluting Synergy stent in proximal circumflex     HERNIA REPAIR      LUMBAR FUSION      PARTIAL HYSTERECTOMY      TONSILLECTOMY      TOTAL KNEE ARTHROPLASTY Right     TOTAL SHOULDER ARTHROPLASTY Right        Social History:  Social History     Socioeconomic History    Marital status: Divorced     Spouse name: Not on file    Number of children: Not on file    Years of education: Not on file    Highest education level: Not on file   Occupational History    Not on file   Social Needs    Financial resource strain: Not on file    Food insecurity:     Worry: Not on file     Inability: Not on file    Transportation needs:     Medical: Not on file     Non-medical: Not on file   Tobacco Use     Smoking status: Never Smoker    Smokeless tobacco: Never Used   Substance and Sexual Activity    Alcohol use: No    Drug use: No    Sexual activity: Not on file   Lifestyle    Physical activity:     Days per week: Not on file     Minutes per session: Not on file    Stress: Not on file   Relationships    Social connections:     Talks on phone: Not on file     Gets together: Not on file     Attends religious service: Not on file     Active member of club or organization: Not on file     Attends meetings of clubs or organizations: Not on file     Relationship status: Not on file    Intimate partner violence:     Fear of current or ex partner: Not on file     Emotionally abused: Not on file     Physically abused: Not on file     Forced sexual activity: Not on file   Other Topics Concern    Not on file   Social History Narrative    Not on file       Family History:  Family History   Problem Relation Age of Onset    Heart attack Sister 53        Fatal MI age 49    Anesthesia problems Sister     Clotting disorder Sister     Heart attack Maternal Aunt     Heart attack Paternal Aunt     Coronary artery disease Mother     Kidney failure Mother         ESRD on dialysis    Stroke Mother     Heart attack Brother     Cancer Brother     Coronary artery disease Paternal Grandmother     Breast cancer Cousin     Breast cancer Niece        Outpatient Medication:  Previous Medications    ALLOPURINOL (  ZYLOPRIM) 300 MG TABLET    Take 300 mg by mouth daily       AMLODIPINE (NORVASC) 10 MG TABLET    Take 10 mg by mouth daily.    ATORVASTATIN (LIPITOR) 80 MG TABLET    Take 80 mg by mouth daily.    CHLORTHALIDONE (HYGROTEN) 25 MG TABLET    Take 1 tablet (25 mg total) by mouth every other day    CITALOPRAM (CELEXA) 40 MG TABLET    Take 40 mg by mouth daily.    CLOPIDOGREL (PLAVIX) 75 MG TABLET    Take 75 mg by mouth nightly.       ISOSORBIDE MONONITRATE (IMDUR) 30 MG 24 HR TABLET    Take 2 tablets (60 mg total) by mouth  daily    LEVOTHYROXINE (SYNTHROID, LEVOTHROID) 75 MCG TABLET    Take 75 mcg by mouth Once a day at 6:00am.    LISINOPRIL (PRINIVIL,ZESTRIL) 10 MG TABLET    Take 10 mg by mouth daily    METOPROLOL SUCCINATE XL (TOPROL-XL) 25 MG 24 HR TABLET    Take 1 tablet (25 mg total) by mouth daily    MONTELUKAST (SINGULAIR) 10 MG TABLET    Take 10 mg by mouth nightly.    OXYCODONE (ROXICODONE) 5 MG IMMEDIATE RELEASE TABLET    Take 1 tablet (5 mg total) by mouth every 4 (four) hours as needed for Pain.    PANTOPRAZOLE (PROTONIX) 40 MG TABLET    Take 40 mg by mouth daily.        PROAIR HFA 108 (90 BASE) MCG/ACT INHALER    2 puffs every 6 (six) hours as needed.        RANOLAZINE (RANEXA) 500 MG 12 HR TABLET    Take 1 tablet (500 mg total) by mouth every 12 (twelve) hours History of allergy to red dye but tolerated Renexa in the hospital    RIVAROXABAN (XARELTO) 10 MG TAB    Take 20 mg by mouth daily with dinner            REVIEW OF SYSTEMS   Review of Systems   Constitutional: Positive for diaphoresis. Negative for chills and fever.   HENT: Negative for congestion.    Eyes: Negative for double vision.   Respiratory: Positive for shortness of breath. Negative for cough.    Cardiovascular: Positive for chest pain.   Gastrointestinal: Positive for nausea. Negative for abdominal pain and vomiting.   Genitourinary: Negative for dysuria and hematuria.   Musculoskeletal: Negative for myalgias.   Skin: Negative for rash.   Neurological: Positive for dizziness. Negative for focal weakness.           PHYSICAL EXAM   BP 155/78    Pulse (!) 107    Temp 99.5 F (37.5 C) (Temporal)    Resp 22    Wt 108.6 kg    SpO2 96%    BMI 39.84 kg/m     Physical Exam  Vitals signs and nursing note reviewed.   Constitutional:       Appearance: She is well-developed. She is not diaphoretic.   HENT:      Head: Normocephalic and atraumatic.   Eyes:      Pupils: Pupils are equal, round, and reactive to light.   Neck:      Musculoskeletal: Normal range of motion  and neck supple.   Cardiovascular:      Rate and Rhythm: Regular rhythm. Tachycardia present.      Heart sounds:  Normal heart sounds.   Pulmonary:      Effort: Pulmonary effort is normal. No respiratory distress.      Breath sounds: Normal breath sounds. No wheezing.   Abdominal:      General: Bowel sounds are normal. There is no distension.      Palpations: Abdomen is soft.      Tenderness: There is no abdominal tenderness. There is no guarding or rebound.   Musculoskeletal: Normal range of motion.      Comments: B/l LE edema 1-2+   Skin:     General: Skin is warm and dry.      Findings: No rash.   Neurological:      Mental Status: She is alert and oriented to person, place, and time.             MEDICAL DECISION MAKING     DISCUSSION        MEDICAL DECISION MAKING:    68 y.o. female with complaint of chest pain and palpitations.  On presentation she is tachycardic.  She is nontoxic, no acute distress.  EKG shows sinus tachycardia, nonspecific ST changes, no acute ST elevation.  Does not appear to be grossly changed compared to previous, though the rate has increased.  Patient does have significant cardiac history.  Patient has negative initial troponin I.  Her magnesium is 1.4.  Patient was given magnesium supplementation.  X-ray shows questionable opacities in lung bases, could be atelectasis versus an infiltrate.  Patient without fever, cough congestion or hypoxia, less suspicious for pneumonia.  Suspect is more related to atelectasis, body habitus.  Patient states that she has been compliant with her medications, and Xarelto.  Less likely that she has a PE.  Patient does have a contrast dye allergy that caused what sounds like anaphylaxis.  Patient remains improved.  She states that she has adequate pain control with medications, and she feels like her pain is gotten much better.  Patient to have hospital evaluation for chest pain.  Care transitioned.    1:01 AM Spoke with Dr. Ricka Burdock, Hospitalist, who will see  and evaluate the patient.      Procedures    Oxygen Saturation by Pulse Oximetry: 94%  Interventions: None Needed.  Interpretation: Normal    Vital Signs:  Reviewed the patient's vital signs.  Nursing Notes:  Reviewed and utilized available nursing notes.  Medical Records:  Reviewed available past medical records.      EKG    The following studies were independently interpreted by the Emergency Medicine Physician.  For full study results please see chart.    EKG Interpretation:  Signed and interpreted by ED Physician   Time Interpreted: 22:45  Comparison:   Rate: 104  Rhythm: sinus tachcyardia  Axis: Normal  Intervals: normal  Blocks: none  Interpretation: sinus tachycardia, nonspecific ST and T wave changes      DIAGNOSTIC STUDIES      XR Chest  AP Portable   Final Result      Suboptimal exam due to AP technique, body habitus and low lung volumes.      Questionable opacities at the lung bases, either atelectasis or   pneumonia.      Johnsie Kindred, MD    07/03/2018 11:46 PM            Results     Procedure Component Value Units Date/Time    UA Reflex to Micro - Reflex to Culture [[id number]]  (Abnormal) Collected:  07/04/18  4098     Updated:  07/04/18 1191     Urine Type Urine, Clean Ca     Color, UA Yellow     Clarity, UA Clear     Specific Gravity UA 1.023     Urine pH 5.0     Leukocyte Esterase, UA Trace     Nitrite, UA Negative     Protein, UR Negative     Glucose, UA Negative     Ketones UA Negative     Urobilinogen, UA Normal mg/dL      Bilirubin, UA Negative     Blood, UA Negative     RBC, UA 0-2 /hpf      WBC, UA 6-10 /hpf      Squamous Epithelial Cells, Urine 0-5 /hpf      Urine Mucus Present    Narrative:       Replace urinary catheter prior to obtaining the urine culture  if it has been in place for greater than or equal to 14  days:->N/A No Foley  Indications for U/A Reflex to Micro - Reflex to  Culture:->Suprapubic Pain/Tenderness or Dysuria    B-type Natriuretic Peptide [[id number]] Collected:  07/04/18  0011    Specimen:  Blood Updated:  07/04/18 0045     B-Natriuretic Peptide 25.1 pg/mL     Narrative:       Replace urinary catheter prior to obtaining the urine culture  if it has been in place for greater than or equal to 14  days:->N/A No Foley  Indications for U/A Reflex to Micro - Reflex to  Culture:->Suprapubic Pain/Tenderness or Dysuria    PT/APTT [[id number]]  (Abnormal) Collected:  07/04/18 0011     Updated:  07/04/18 0044     PT 14.7 sec      PT INR 1.2     PTT 35 sec     Narrative:       Replace urinary catheter prior to obtaining the urine culture  if it has been in place for greater than or equal to 14  days:->N/A No Foley  Indications for U/A Reflex to Micro - Reflex to  Culture:->Suprapubic Pain/Tenderness or Dysuria    Troponin I [[id number]] Collected:  07/04/18 0011    Specimen:  Blood Updated:  07/04/18 0044     Troponin I 0.02 ng/mL     Narrative:       Replace urinary catheter prior to obtaining the urine culture  if it has been in place for greater than or equal to 14  days:->N/A No Foley  Indications for U/A Reflex to Micro - Reflex to  Culture:->Suprapubic Pain/Tenderness or Dysuria    CBC and differential [[id number]] Collected:  07/04/18 0011    Specimen:  Blood Updated:  07/04/18 0039     WBC 7.41 x10 3/uL      Hgb 12.9 g/dL      Hematocrit 25.3 %      Platelets 171 x10 3/uL      RBC 4.08 x10 6/uL      MCV 94.4 fL      MCH 31.6 pg      MCHC 33.5 g/dL      RDW 13 %      MPV 11.1 fL      Neutrophils 60.8 %      Lymphocytes Automated 26.3 %      Monocytes 11.2 %      Eosinophils Automated 1.1 %      Basophils Automated 0.3 %  Immature Granulocyte 0.3 %      Nucleated RBC 0.0 /100 WBC      Neutrophils Absolute 4.51 x10 3/uL      Abs Lymph Automated 1.95 x10 3/uL      Abs Mono Automated 0.83 x10 3/uL      Abs Eos Automated 0.08 x10 3/uL      Absolute Baso Automated 0.02 x10 3/uL      Absolute Immature Granulocyte 0.02 x10 3/uL      Absolute NRBC 0.00 x10 3/uL     Narrative:       Replace  urinary catheter prior to obtaining the urine culture  if it has been in place for greater than or equal to 14  days:->N/A No Foley  Indications for U/A Reflex to Micro - Reflex to  Culture:->Suprapubic Pain/Tenderness or Dysuria    Comprehensive metabolic panel [[id number]]  (Abnormal) Collected:  07/03/18 2339    Specimen:  Blood Updated:  07/04/18 0031     Glucose 113 mg/dL      BUN 40.9 mg/dL      Creatinine 1.0 mg/dL      Sodium 811 mEq/L      Potassium 4.2 mEq/L      Chloride 107 mEq/L      CO2 22 mEq/L      Calcium 8.9 mg/dL      Protein, Total 7.2 g/dL      Albumin 4.0 g/dL      AST (SGOT) 21 U/L      ALT 19 U/L      Alkaline Phosphatase 76 U/L      Bilirubin, Total <1.0 mg/dL      Globulin 3.2 g/dL      Albumin/Globulin Ratio 1.3     Anion Gap 11.0    Narrative:       Replace urinary catheter prior to obtaining the urine culture  if it has been in place for greater than or equal to 14  days:->N/A No Foley  Indications for U/A Reflex to Micro - Reflex to  Culture:->Suprapubic Pain/Tenderness or Dysuria    Lipase [[id number]] Collected:  07/03/18 2339    Specimen:  Blood Updated:  07/04/18 0031     Lipase 28 U/L     Narrative:       Replace urinary catheter prior to obtaining the urine culture  if it has been in place for greater than or equal to 14  days:->N/A No Foley  Indications for U/A Reflex to Micro - Reflex to  Culture:->Suprapubic Pain/Tenderness or Dysuria    Magnesium [[id number]]  (Abnormal) Collected:  07/03/18 2339    Specimen:  Blood Updated:  07/04/18 0031     Magnesium 1.4 mg/dL     Narrative:       Replace urinary catheter prior to obtaining the urine culture  if it has been in place for greater than or equal to 14  days:->N/A No Foley  Indications for U/A Reflex to Micro - Reflex to  Culture:->Suprapubic Pain/Tenderness or Dysuria    GFR [[id number]] Collected:  07/03/18 2339     Updated:  07/04/18 0031     EGFR 55.2    Narrative:       Replace urinary catheter prior to obtaining the urine  culture  if it has been in place for greater than or equal to 14  days:->N/A No Foley  Indications for U/A Reflex to Micro - Reflex to  Culture:->Suprapubic Pain/Tenderness or Dysuria  EMERGENCY DEPT. MEDICATIONS      ED Medication Orders (From admission, onward)    Start Ordered     Status Ordering Provider    07/04/18 2200 07/04/18 0201  clopidogrel (PLAVIX) tablet 75 mg  At bedtime     Route: Oral  Ordered Dose: 75 mg     Acknowledged HARTOJO, WIBISONO    07/04/18 2200 07/04/18 0201  montelukast (SINGULAIR) tablet 10 mg  At bedtime     Route: Oral  Ordered Dose: 10 mg     Acknowledged HARTOJO, WIBISONO    07/04/18 1700 07/04/18 0204  rivaroxaban (XARELTO) tablet 20 mg  Daily with dinner     Route: Oral  Ordered Dose: 20 mg     Acknowledged HARTOJO, WIBISONO    07/04/18 0900 07/04/18 0201  allopurinol (ZYLOPRIM) tablet 300 mg  Daily     Route: Oral  Ordered Dose: 300 mg     Acknowledged HARTOJO, WIBISONO    07/04/18 0900 07/04/18 0201  amLODIPine (NORVASC) tablet 10 mg  Daily     Route: Oral  Ordered Dose: 10 mg     Acknowledged HARTOJO, WIBISONO    07/04/18 0900 07/04/18 0201  atorvastatin (LIPITOR) tablet 80 mg  Daily     Route: Oral  Ordered Dose: 80 mg     Acknowledged HARTOJO, WIBISONO    07/04/18 0900 07/04/18 0201  citalopram (CeleXA) tablet 40 mg  Daily     Route: Oral  Ordered Dose: 40 mg     Acknowledged HARTOJO, WIBISONO    07/04/18 0900 07/04/18 0201  isosorbide mononitrate (IMDUR) 24 hr tablet 60 mg  Daily     Route: Oral  Ordered Dose: 60 mg     Acknowledged HARTOJO, WIBISONO    07/04/18 0900 07/04/18 0201  lisinopril (ZESTRIL) tablet 10 mg  Daily     Route: Oral  Ordered Dose: 10 mg     Acknowledged HARTOJO, WIBISONO    07/04/18 0900 07/04/18 0201  ranolazine (RANEXA) 12 hr tablet 500 mg  Every 12 hours scheduled     Route: Oral  Ordered Dose: 500 mg     Acknowledged HARTOJO, WIBISONO    07/04/18 0800 07/04/18 0201  metoprolol succinate XL (TOPROL-XL) 24 hr tablet 25 mg  Daily     Route:  Oral  Ordered Dose: 25 mg     Acknowledged HARTOJO, WIBISONO    07/04/18 0745 07/04/18 0201  pantoprazole (PROTONIX) EC tablet 40 mg  Every morning before breakfast     Route: Oral  Ordered Dose: 40 mg     Acknowledged HARTOJO, WIBISONO    07/04/18 0600 07/04/18 0201  levothyroxine (SYNTHROID) tablet 75 mcg  Daily at 0600     Route: Oral  Ordered Dose: 75 mcg     Acknowledged Dorthula Nettles    07/04/18 0204 07/04/18 0203    Continuous     Route: Intravenous     Discontinued HARTOJO, WIBISONO    07/04/18 0203 07/04/18 0202  magnesium sulfate 1g in dextrose 5% IVPB (premix)  Once     Route: Intravenous  Ordered Dose: 1 g     Last MAR action:  Stopped HARTOJO, WIBISONO    07/04/18 0201 07/04/18 0201  morphine injection 2 mg  Every 4 hours PRN     Route: Intravenous  Ordered Dose: 2 mg     Last MAR action:  Given HARTOJO, WIBISONO    07/04/18 0201 07/04/18 0201  oxyCODONE (ROXICODONE) immediate release tablet 5 mg  Every 4  hours PRN     Route: Oral  Ordered Dose: 5 mg     Acknowledged HARTOJO, WIBISONO    07/04/18 0201 07/04/18 0201  albuterol (PROVENTIL) (2.5 MG/3ML) 0.083% nebulizer solution 2.5 mg  RT - Every 6 hours as needed     Route: Nebulization  Ordered Dose: 2.5 mg     Acknowledged HARTOJO, WIBISONO    07/04/18 0158 07/04/18 0158  acetaminophen (TYLENOL) tablet 650 mg  Every 4 hours PRN     Route: Oral  Ordered Dose: 650 mg     Acknowledged HARTOJO, WIBISONO    07/04/18 0158 07/04/18 0158  ondansetron (ZOFRAN) injection 4 mg  Every 6 hours PRN     Route: Intravenous  Ordered Dose: 4 mg     Acknowledged Dorthula Nettles    07/04/18 0057 07/04/18 0056  morphine injection 4 mg  Once     Route: Intravenous  Ordered Dose: 4 mg     Last MAR action:  Given Mitchel Honour T    07/04/18 0057 07/04/18 0056  famotidine (PEPCID) injection 20 mg  Once     Route: Intravenous  Ordered Dose: 20 mg     Last MAR action:  Given Mitchel Honour T    07/04/18 0033 07/04/18 0032  magnesium sulfate 1g in dextrose 5%  IVPB (premix)  Once     Route: Intravenous  Ordered Dose: 1 g     Last MAR action:  Stopped Sameul Tagle T    07/03/18 2304 07/03/18 2303  ondansetron (ZOFRAN) injection 4 mg  Once     Route: Intravenous  Ordered Dose: 4 mg     Last MAR action:  Given Mitchel Honour T    07/03/18 2301 07/03/18 2300  sodium chloride 0.9 % bolus 500 mL  Once     Route: Intravenous  Ordered Dose: 500 mL     Last MAR action:  [employer], Ronin Crager T    07/03/18 2259 07/03/18 2300  nitroglycerin (NITROSTAT) SL tablet 0.4 mg  Every 5 min PRN     Route: Sublingual  Ordered Dose: 0.4 mg     Last MAR action:  Given HARTOJO, WIBISONO              DIAGNOSIS      Diagnosis:  Final diagnoses:   Chest pain, unspecified type   Hypomagnesemia   Paroxysmal atrial fibrillation   Essential hypertension       Disposition:  ED Disposition     ED Disposition Condition Date/Time Comment    Observation  Tue Jul 04, 2018  1:54 AM Admitting Physician: Dorthula Nettles [[id number]]   Diagnosis: Chest pain [9147829]   Estimated Length of Stay: < 2 midnights   Tentative Discharge Plan?: Home or Self Care [1]   Patient Class: Observation [104]            Prescriptions:  Patient's Medications   New Prescriptions    No medications on file   Previous Medications    ALLOPURINOL (ZYLOPRIM) 300 MG TABLET    Take 300 mg by mouth daily       AMLODIPINE (NORVASC) 10 MG TABLET    Take 10 mg by mouth daily.    ATORVASTATIN (LIPITOR) 80 MG TABLET    Take 80 mg by mouth daily.    CHLORTHALIDONE (HYGROTEN) 25 MG TABLET    Take 1 tablet (25 mg total) by mouth every other day    CITALOPRAM (CELEXA) 40 MG TABLET    Take 40 mg  by mouth daily.    CLOPIDOGREL (PLAVIX) 75 MG TABLET    Take 75 mg by mouth nightly.       ISOSORBIDE MONONITRATE (IMDUR) 30 MG 24 HR TABLET    Take 2 tablets (60 mg total) by mouth daily    LEVOTHYROXINE (SYNTHROID, LEVOTHROID) 75 MCG TABLET    Take 75 mcg by mouth Once a day at 6:00am.    LISINOPRIL (PRINIVIL,ZESTRIL) 10 MG TABLET    Take 10 mg by mouth  daily    METOPROLOL SUCCINATE XL (TOPROL-XL) 25 MG 24 HR TABLET    Take 1 tablet (25 mg total) by mouth daily    MONTELUKAST (SINGULAIR) 10 MG TABLET    Take 10 mg by mouth nightly.    OXYCODONE (ROXICODONE) 5 MG IMMEDIATE RELEASE TABLET    Take 1 tablet (5 mg total) by mouth every 4 (four) hours as needed for Pain.    PANTOPRAZOLE (PROTONIX) 40 MG TABLET    Take 40 mg by mouth daily.        PROAIR HFA 108 (90 BASE) MCG/ACT INHALER    2 puffs every 6 (six) hours as needed.        RANOLAZINE (RANEXA) 500 MG 12 HR TABLET    Take 1 tablet (500 mg total) by mouth every 12 (twelve) hours History of allergy to red dye but tolerated Renexa in the hospital    RIVAROXABAN (XARELTO) 10 MG TAB    Take 20 mg by mouth daily with dinner      Modified Medications    No medications on file   Discontinued Medications    No medications on file         I, Dr. Excell Seltzer, DO, have been the primary provider for [name] during this Emergency Dept visit.     [address] Cobie Leidner T, DO  07/04/18 [phone]

## 2018-07-03 NOTE — ED Triage Notes (Signed)
6/10 chest pain described as heaviness with radiation to left arm. Also states having dizziness, shortness of breath, diaphoresis, and nausea. HX 11 cardiac stents, PE, MI

## 2018-07-04 ENCOUNTER — Encounter: Payer: Self-pay | Admitting: Internal Medicine

## 2018-07-04 DIAGNOSIS — F329 Major depressive disorder, single episode, unspecified: Secondary | ICD-10-CM

## 2018-07-04 DIAGNOSIS — Z7901 Long term (current) use of anticoagulants: Secondary | ICD-10-CM

## 2018-07-04 DIAGNOSIS — E785 Hyperlipidemia, unspecified: Secondary | ICD-10-CM

## 2018-07-04 DIAGNOSIS — Z7902 Long term (current) use of antithrombotics/antiplatelets: Secondary | ICD-10-CM

## 2018-07-04 DIAGNOSIS — G4733 Obstructive sleep apnea (adult) (pediatric): Secondary | ICD-10-CM

## 2018-07-04 DIAGNOSIS — I480 Paroxysmal atrial fibrillation: Secondary | ICD-10-CM | POA: Diagnosis present

## 2018-07-04 DIAGNOSIS — E039 Hypothyroidism, unspecified: Secondary | ICD-10-CM

## 2018-07-04 DIAGNOSIS — R079 Chest pain, unspecified: Secondary | ICD-10-CM

## 2018-07-04 DIAGNOSIS — K219 Gastro-esophageal reflux disease without esophagitis: Secondary | ICD-10-CM

## 2018-07-04 DIAGNOSIS — M109 Gout, unspecified: Secondary | ICD-10-CM

## 2018-07-04 DIAGNOSIS — Z955 Presence of coronary angioplasty implant and graft: Secondary | ICD-10-CM

## 2018-07-04 DIAGNOSIS — E782 Mixed hyperlipidemia: Secondary | ICD-10-CM

## 2018-07-04 DIAGNOSIS — R600 Localized edema: Secondary | ICD-10-CM

## 2018-07-04 DIAGNOSIS — I2510 Atherosclerotic heart disease of native coronary artery without angina pectoris: Secondary | ICD-10-CM

## 2018-07-04 DIAGNOSIS — G43109 Migraine with aura, not intractable, without status migrainosus: Secondary | ICD-10-CM | POA: Insufficient documentation

## 2018-07-04 DIAGNOSIS — I10 Essential (primary) hypertension: Secondary | ICD-10-CM

## 2018-07-04 DIAGNOSIS — Z86711 Personal history of pulmonary embolism: Secondary | ICD-10-CM

## 2018-07-04 DIAGNOSIS — Z9989 Dependence on other enabling machines and devices: Secondary | ICD-10-CM

## 2018-07-04 LAB — CBC AND DIFFERENTIAL
Absolute NRBC: 0 10*3/uL (ref 0.00–0.00)
Absolute NRBC: 0 10*3/uL (ref 0.00–0.00)
Basophils Absolute Automated: 0.02 10*3/uL (ref 0.00–0.08)
Basophils Absolute Automated: 0.02 10*3/uL (ref 0.00–0.08)
Basophils Automated: 0.3 %
Basophils Automated: 0.4 %
Eosinophils Absolute Automated: 0.08 10*3/uL (ref 0.00–0.44)
Eosinophils Absolute Automated: 0.08 10*3/uL (ref 0.00–0.44)
Eosinophils Automated: 1.1 %
Eosinophils Automated: 1.4 %
Hematocrit: 36.8 % (ref 34.7–43.7)
Hematocrit: 38.5 % (ref 34.7–43.7)
Hgb: 11.9 g/dL (ref 11.4–14.8)
Hgb: 12.9 g/dL (ref 11.4–14.8)
Immature Granulocytes Absolute: 0.02 10*3/uL (ref 0.00–0.07)
Immature Granulocytes Absolute: 0.02 10*3/uL (ref 0.00–0.07)
Immature Granulocytes: 0.3 %
Immature Granulocytes: 0.4 %
Lymphocytes Absolute Automated: 1.84 10*3/uL (ref 0.42–3.22)
Lymphocytes Absolute Automated: 1.95 10*3/uL (ref 0.42–3.22)
Lymphocytes Automated: 26.3 %
Lymphocytes Automated: 32.4 %
MCH: 31.1 pg (ref 25.1–33.5)
MCH: 31.6 pg (ref 25.1–33.5)
MCHC: 32.3 g/dL (ref 31.5–35.8)
MCHC: 33.5 g/dL (ref 31.5–35.8)
MCV: 94.4 fL (ref 78.0–96.0)
MCV: 96.1 fL — ABNORMAL HIGH (ref 78.0–96.0)
MPV: 11.1 fL (ref 8.9–12.5)
MPV: 11.1 fL (ref 8.9–12.5)
Monocytes Absolute Automated: 0.77 10*3/uL (ref 0.21–0.85)
Monocytes Absolute Automated: 0.83 10*3/uL (ref 0.21–0.85)
Monocytes: 11.2 %
Monocytes: 13.6 %
Neutrophils Absolute: 2.95 10*3/uL (ref 1.10–6.33)
Neutrophils Absolute: 4.51 10*3/uL (ref 1.10–6.33)
Neutrophils: 51.8 %
Neutrophils: 60.8 %
Nucleated RBC: 0 /100 WBC (ref 0.0–0.0)
Nucleated RBC: 0 /100 WBC (ref 0.0–0.0)
Platelets: 156 10*3/uL (ref 142–346)
Platelets: 171 10*3/uL (ref 142–346)
RBC: 3.83 10*6/uL — ABNORMAL LOW (ref 3.90–5.10)
RBC: 4.08 10*6/uL (ref 3.90–5.10)
RDW: 13 % (ref 11–15)
RDW: 13 % (ref 11–15)
WBC: 5.68 10*3/uL (ref 3.10–9.50)
WBC: 7.41 10*3/uL (ref 3.10–9.50)

## 2018-07-04 LAB — MAGNESIUM
Magnesium: 1.4 mg/dL — ABNORMAL LOW (ref 1.6–2.6)
Magnesium: 2.4 mg/dL (ref 1.6–2.6)

## 2018-07-04 LAB — GFR
EGFR: 55.2
EGFR: 55.2

## 2018-07-04 LAB — PT AND APTT
PT INR: 1.2 — ABNORMAL HIGH (ref 0.9–1.1)
PT: 14.7 s (ref 12.6–15.0)
PTT: 35 s (ref 23–37)

## 2018-07-04 LAB — COMPREHENSIVE METABOLIC PANEL
ALT: 16 U/L (ref 0–55)
ALT: 19 U/L (ref 0–55)
AST (SGOT): 17 U/L (ref 5–34)
AST (SGOT): 21 U/L (ref 5–34)
Albumin/Globulin Ratio: 1.3 (ref 0.9–2.2)
Albumin/Globulin Ratio: 1.4 (ref 0.9–2.2)
Albumin: 4 g/dL (ref 3.5–5.0)
Albumin: 4 g/dL (ref 3.5–5.0)
Alkaline Phosphatase: 76 U/L (ref 37–106)
Alkaline Phosphatase: 76 U/L (ref 37–106)
Anion Gap: 11 (ref 5.0–15.0)
Anion Gap: 11 (ref 5.0–15.0)
BUN: 19.6 mg/dL — ABNORMAL HIGH (ref 7.0–19.0)
BUN: 21.7 mg/dL — ABNORMAL HIGH (ref 7.0–19.0)
Bilirubin, Total: 0.8 mg/dL (ref 0.2–1.2)
Bilirubin, Total: <1 mg/dL (ref 0.2–1.2)
CO2: 22 mEq/L (ref 22–29)
CO2: 24 mEq/L (ref 22–29)
Calcium: 8.8 mg/dL (ref 8.5–10.5)
Calcium: 8.9 mg/dL (ref 8.5–10.5)
Chloride: 105 mEq/L (ref 100–111)
Chloride: 107 mEq/L (ref 100–111)
Creatinine: 1 mg/dL (ref 0.6–1.0)
Creatinine: 1 mg/dL (ref 0.6–1.0)
Globulin: 2.8 g/dL (ref 2.0–3.6)
Globulin: 3.2 g/dL (ref 2.0–3.6)
Glucose: 113 mg/dL — ABNORMAL HIGH (ref 70–100)
Glucose: 92 mg/dL (ref 70–100)
Potassium: 4 mEq/L (ref 3.5–5.1)
Potassium: 4.2 mEq/L (ref 3.5–5.1)
Protein, Total: 6.8 g/dL (ref 6.0–8.3)
Protein, Total: 7.2 g/dL (ref 6.0–8.3)
Sodium: 140 mEq/L (ref 136–145)
Sodium: 140 mEq/L (ref 136–145)

## 2018-07-04 LAB — TROPONIN I
Troponin I: 0.01 ng/mL (ref 0.00–0.05)
Troponin I: 0.02 ng/mL (ref 0.00–0.05)
Troponin I: <0.01 ng/mL (ref 0.00–0.05)

## 2018-07-04 LAB — LIPID PANEL
Cholesterol / HDL Ratio: 3.6
Cholesterol: 115 mg/dL (ref 0–199)
HDL: 32 mg/dL — ABNORMAL LOW (ref 40–9999)
LDL Calculated: 59 mg/dL (ref 0–99)
Triglycerides: 121 mg/dL (ref 34–149)
VLDL Calculated: 24 mg/dL (ref 10–40)

## 2018-07-04 LAB — URINALYSIS REFLEX TO MICROSCOPIC EXAM - REFLEX TO CULTURE
Bilirubin, UA: NEGATIVE
Blood, UA: NEGATIVE
Glucose, UA: NEGATIVE
Ketones UA: NEGATIVE
Nitrite, UA: NEGATIVE
Protein, UR: NEGATIVE
Specific Gravity UA: 1.023 (ref 1.001–1.035)
Urine pH: 5 (ref 5.0–8.0)
Urobilinogen, UA: NORMAL mg/dL (ref 0.2–2.0)

## 2018-07-04 LAB — LIPASE: Lipase: 28 U/L (ref 8–78)

## 2018-07-04 LAB — TSH: TSH: 2.32 u[IU]/mL (ref 0.35–4.94)

## 2018-07-04 LAB — CK: Creatine Kinase (CK): 31 U/L (ref 29–168)

## 2018-07-04 LAB — HEMOLYSIS INDEX: Hemolysis Index: 11 (ref 0–18)

## 2018-07-04 LAB — B-TYPE NATRIURETIC PEPTIDE: B-Natriuretic Peptide: 25.1 pg/mL (ref 0.0–100.0)

## 2018-07-04 MED ORDER — MORPHINE SULFATE 2 MG/ML IJ/IV SOLN (WRAP)
2.0000 mg | Status: DC | PRN
Start: 2018-07-04 — End: 2018-07-04
  Administered 2018-07-04 (×2): 2 mg via INTRAVENOUS
  Filled 2018-07-04 (×2): qty 1

## 2018-07-04 MED ORDER — RIVAROXABAN 20 MG PO TABS
20.0000 mg | ORAL_TABLET | Freq: Every day | ORAL | Status: DC
Start: 2018-07-04 — End: 2018-07-04
  Administered 2018-07-04: 20 mg via ORAL
  Filled 2018-07-04: qty 1

## 2018-07-04 MED ORDER — LEVOTHYROXINE SODIUM 75 MCG PO TABS
75.0000 ug | ORAL_TABLET | Freq: Every day | ORAL | Status: DC
Start: 2018-07-04 — End: 2018-07-04
  Administered 2018-07-04: 75 ug via ORAL
  Filled 2018-07-04 (×3): qty 1

## 2018-07-04 MED ORDER — CLOPIDOGREL BISULFATE 75 MG PO TABS
75.0000 mg | ORAL_TABLET | Freq: Every evening | ORAL | Status: DC
Start: 2018-07-04 — End: 2018-07-04

## 2018-07-04 MED ORDER — MAGNESIUM SULFATE IN D5W 1-5 GM/100ML-% IV SOLN
1.00 g | Freq: Once | INTRAVENOUS | Status: AC
Start: 2018-07-04 — End: 2018-07-04
  Administered 2018-07-04: 02:00:00 1 g via INTRAVENOUS
  Filled 2018-07-04: qty 100

## 2018-07-04 MED ORDER — ISOSORBIDE MONONITRATE ER 60 MG PO TB24
60.00 mg | ORAL_TABLET | Freq: Every day | ORAL | Status: DC
Start: 2018-07-04 — End: 2018-07-04
  Administered 2018-07-04: 60 mg via ORAL
  Filled 2018-07-04: qty 1

## 2018-07-04 MED ORDER — ALLOPURINOL 300 MG PO TABS
300.0000 mg | ORAL_TABLET | Freq: Every day | ORAL | Status: DC
Start: 2018-07-04 — End: 2018-07-04
  Administered 2018-07-04: 300 mg via ORAL
  Filled 2018-07-04: qty 1

## 2018-07-04 MED ORDER — CITALOPRAM HYDROBROMIDE 20 MG PO TABS
40.0000 mg | ORAL_TABLET | Freq: Every day | ORAL | Status: DC
Start: 2018-07-04 — End: 2018-07-04
  Filled 2018-07-04: qty 2

## 2018-07-04 MED ORDER — ATORVASTATIN CALCIUM 20 MG PO TABS
80.0000 mg | ORAL_TABLET | Freq: Every day | ORAL | Status: DC
Start: 2018-07-04 — End: 2018-07-04
  Administered 2018-07-04: 80 mg via ORAL
  Filled 2018-07-04: qty 4

## 2018-07-04 MED ORDER — METOPROLOL SUCCINATE ER 25 MG PO TB24
25.00 mg | ORAL_TABLET | Freq: Every day | ORAL | Status: DC
Start: 2018-07-04 — End: 2018-07-04
  Administered 2018-07-04: 25 mg via ORAL
  Filled 2018-07-04: qty 1

## 2018-07-04 MED ORDER — OXYCODONE HCL 5 MG PO TABS
5.0000 mg | ORAL_TABLET | ORAL | Status: DC | PRN
Start: 2018-07-04 — End: 2018-07-04

## 2018-07-04 MED ORDER — MONTELUKAST SODIUM 10 MG PO TABS
10.0000 mg | ORAL_TABLET | Freq: Every evening | ORAL | Status: DC
Start: 2018-07-04 — End: 2018-07-04

## 2018-07-04 MED ORDER — PANTOPRAZOLE SODIUM 40 MG PO TBEC
40.00 mg | DELAYED_RELEASE_TABLET | Freq: Every morning | ORAL | Status: DC
Start: 2018-07-04 — End: 2018-07-04
  Administered 2018-07-04: 40 mg via ORAL
  Filled 2018-07-04: qty 1

## 2018-07-04 MED ORDER — LISINOPRIL 10 MG PO TABS
10.0000 mg | ORAL_TABLET | Freq: Every day | ORAL | Status: DC
Start: 2018-07-04 — End: 2018-07-04
  Administered 2018-07-04: 10 mg via ORAL
  Filled 2018-07-04: qty 1

## 2018-07-04 MED ORDER — ONDANSETRON HCL 4 MG/2ML IJ SOLN
4.00 mg | Freq: Four times a day (QID) | INTRAMUSCULAR | Status: DC | PRN
Start: 2018-07-04 — End: 2018-07-04

## 2018-07-04 MED ORDER — MAGNESIUM SULFATE IN D5W 1-5 GM/100ML-% IV SOLN
1.00 g | Freq: Once | INTRAVENOUS | Status: AC
Start: 2018-07-04 — End: 2018-07-04
  Administered 2018-07-04: 01:00:00 1 g via INTRAVENOUS
  Filled 2018-07-04: qty 100

## 2018-07-04 MED ORDER — AMLODIPINE BESYLATE 5 MG PO TABS
10.0000 mg | ORAL_TABLET | Freq: Every day | ORAL | Status: DC
Start: 2018-07-04 — End: 2018-07-04
  Administered 2018-07-04: 10 mg via ORAL
  Filled 2018-07-04: qty 2

## 2018-07-04 MED ORDER — FAMOTIDINE 10 MG/ML IV SOLN (WRAP)
20.00 mg | Freq: Once | INTRAVENOUS | Status: AC
Start: 2018-07-04 — End: 2018-07-04
  Administered 2018-07-04: 02:00:00 20 mg via INTRAVENOUS
  Filled 2018-07-04: qty 2

## 2018-07-04 MED ORDER — SODIUM CHLORIDE 0.9 % IV SOLN
INTRAVENOUS | Status: DC
Start: 2018-07-04 — End: 2018-07-04

## 2018-07-04 MED ORDER — MORPHINE SULFATE 4 MG/ML IJ/IV SOLN (WRAP)
4.0000 mg | Freq: Once | Status: AC
Start: 2018-07-04 — End: 2018-07-04
  Administered 2018-07-04: 02:00:00 4 mg via INTRAVENOUS
  Filled 2018-07-04: qty 1

## 2018-07-04 MED ORDER — ALBUTEROL SULFATE (2.5 MG/3ML) 0.083% IN NEBU
2.50 mg | INHALATION_SOLUTION | Freq: Four times a day (QID) | RESPIRATORY_TRACT | Status: DC | PRN
Start: 2018-07-04 — End: 2018-07-04

## 2018-07-04 MED ORDER — RANOLAZINE ER 500 MG PO TB12
500.00 mg | ORAL_TABLET | Freq: Two times a day (BID) | ORAL | Status: DC
Start: 2018-07-04 — End: 2018-07-04
  Administered 2018-07-04: 500 mg via ORAL
  Filled 2018-07-04: qty 1

## 2018-07-04 MED ORDER — ACETAMINOPHEN 325 MG PO TABS
650.0000 mg | ORAL_TABLET | ORAL | Status: DC | PRN
Start: 2018-07-04 — End: 2018-07-04
  Administered 2018-07-04: 650 mg via ORAL
  Filled 2018-07-04: qty 2

## 2018-07-04 NOTE — Progress Notes (Signed)
Pt d/c to home. Pt left with personal belongings, peripheral iv removed, telemetry removed and returned to monitor room. D/C instructions given by dayshift RN, Mindy. D/C packet signed and given to unit secretary.

## 2018-07-04 NOTE — Discharge Instructions (Signed)
Isosorbide Mononitrate extended-release tablets  Brand Names: Imdur, Isotrate ER  What is this medicine?  ISOSORBIDE MONONITRATE (eye soe SOR bide mon oh NYE trate) is a vasodilator. It relaxes blood vessels, increasing the blood and oxygen supply to your heart. This medicine is used to prevent chest pain caused by angina. It will not help to stop an episode of chest pain.  How should I use this medicine?  Take this medicine by mouth with a glass of water. Follow the directions on the prescription label. Do not crush or chew. Take your medicine at regular intervals. Do not take your medicine more often than directed. Do not stop taking this medicine except on the advice of your doctor or health care professional.  Talk to your pediatrician regarding the use of this medicine in children. Special care may be needed.  What side effects may I notice from receiving this medicine?  Side effects that you should report to your doctor or health care professional as soon as possible:   bluish discoloration of lips, fingernails, or palms of hands   irregular heartbeat, palpitations   low blood pressure   nausea, vomiting   persistent headache   unusually weak or tired  Side effects that usually do not require medical attention (report to your doctor or health care professional if they continue or are bothersome):   flushing of the face or neck   rash  What may interact with this medicine?  Do not take this medicine with any of the following medications:   medicines used to treat erectile dysfunction (ED) like avanafil, sildenafil, tadalafil, and vardenafil   riociguat  This medicine may also interact with the following medications:   medicines for high blood pressure   other medicines for angina or heart failure  What if I miss a dose?  If you miss a dose, take it as soon as you can. If it is almost time for your next dose, take only that dose. Do not take double or extra doses.  Where should I keep my medicine?   Keep out of the reach of children.  Store between 15 and 30 degrees C (59 and 86 degrees F). Keep container tightly closed. Throw away any unused medicine after the expiration date.  What should I tell my health care provider before I take this medicine?  They need to know if you have any of these conditions:   previous heart attack or heart failure   an unusual or allergic reaction to isosorbide mononitrate, nitrates, other medicines, foods, dyes, or preservatives   pregnant or trying to get pregnant   breast-feeding  What should I watch for while using this medicine?  Check your heart rate and blood pressure regularly while you are taking this medicine. Ask your doctor or health care professional what your heart rate and blood pressure should be and when you should contact him or her. Tell your doctor or health care professional if you feel your medicine is no longer working.  You may get dizzy. Do not drive, use machinery, or do anything that needs mental alertness until you know how this medicine affects you. To reduce the risk of dizzy or fainting spells, do not sit or stand up quickly, especially if you are an older patient. Alcohol can make you more dizzy, and increase flushing and rapid heartbeats. Avoid alcoholic drinks.  Do not treat yourself for coughs, colds, or pain while you are taking this medicine without asking your doctor or health   care professional for advice. Some ingredients may increase your blood pressure.  NOTE:This sheet is a summary. It may not cover all possible information. If you have questions about this medicine, talk to your doctor, pharmacist, or health care provider. Copyright 2020 [employer]

## 2018-07-04 NOTE — UM Notes (Signed)
68yo female to ED 07/03/18 1050pm history of CAD, 11 stents, paroxysmal A. fib, PE, on Xarelto, Plavix, HTN, presenting with concern for having chest pain, palpitations.  Patient states that she has been feeling unwell throughout the day.  She reports some generalized fatigue throughout the day.  Approximately an hour prior to presentation, she developed some heaviness in her chest with radiation of pain down her right arm.  She had associated dizziness, shortness of breath, diaphoresis and nausea.  Pt had similar symptoms with previous MI.   99.5, HR 107, RR 22, BP 155/78, sats 95%    Past Medical History:   Diagnosis Date    Anxiety and depression     CAD in native artery     "11 stents"    Carpal tunnel syndrome 05/30/2007    Cervical myelopathy 05/07/2009    Cervical spondylosis 07/30/2009    Chronic anemia     Chronic cough 01/04/2017    Degeneration of lumbar or lumbosacral intervertebral disc 05/29/2007    Encounter for blood transfusion     Essential hypertension     GERD without esophagitis     Gout 10/07/2012    H/O right coronary artery stent placement 08/19/2011    Hemorrhoids 01/04/2017    Hiatal hernia 09/20/2017    History of pertussis     Hypothyroidism, unspecified type     02/25/2018 TSH 0.96, free T4 1.10 (both normal).    Influenza vaccination given 11/29/2017    Lumbar disc herniation 07/10/2007    Lumbar spondylolysis 07/30/2009    Migraine with prolonged aura     Migraine without aura and without status migrainosus, not intractable 07/17/2015    Mixed hyperlipidemia     05/21/2018 lipid panel: Chol 147, TG 151 (H), HDL 35 (L), LDL 82.    Myocardial infarction 2010    Nephrolithiasis 10/07/2012    Obesity 10/07/2012    Obstructive sleep apnea on CPAP 08/19/2011    Paroxysmal atrial fibrillation     Prediabetes 12/09/2017    Presence of drug coated stent in LAD coronary artery 07/29/2014    LAD DES (Xience) 2.25 x 15 mm      Presence of drug coated stent in left  circumflex coronary artery 01/13/2017    3 mm x 20 mm drug-eluting Synergy stent in the proximal circumflex     Pulmonary embolism 10/07/2012    6 months of coumadin    Seasonal allergic rhinitis     Stenosis, cervical spine 05/07/2009    Unstable angina 08/19/2015    Ventricular tachycardia 12/30/2017    Xerostomia 01/04/2017     Glucose 113, BUN 21.7, Mg 1.4  UA + trace leukocytes  CXRY : Questionable opacities at the lung bases, either atelectasis or  Pneumonia.  In ED : .4mg  SL Nitro, IVF bolus , 4mg  IV Zofran, 1g IV Mg xs 2, 4mg  IV Morphine, 20mg  IV Pepcid, 2mg  IV Morphine  Admit : Chest pain w/ palpitations, dizziness, shortness of breath, diaphoresis and nausea / Hypomagnesemia  OBS admit order 07/04/18 0154, tele, po Allopurinol, Amlodipine, Lipitor, Celexa, Plavix, Imdur, Synthyroid, Lisinopril, Metoprolol, Singulair, Protonix, Ranexa, Xarelto, prn Tylenol, prn IV Morphien, prn SL Nitro, prn IV Zofran, prn OxyCodone, NPO, Cardio, serial Trop      MD assessment/plan :  1.  Chest pain:  - 4 baby aspirin by EMS we will not continue since she is on Plavix  -She reports a history of 11 cardiac stents and significant CAD with a history of 2  MI's in the past.  -We will cycle troponins first is negative, stable EKG  -Last echocardiogram was 2018 EF of 69% with grade 1 diastolic dysfunction  -She reports an abnormal stress test in 2019 therefore had a cardiac cath and a stent was placed in the widow [facility]   -Consult [place] [facility]  X-ray showing questionable opacities favoring atelectasis  -Continue with Ranexa  -Morphine/nitro PRN/oxycodone  - NPO incase of cardiac cath  -Repeat EKG in the AM    2.Hypomagnesia: Repleted. repeat labs in the morning  -Potassium is normal    3.CAD with history of 11 stents: She is taking Plavix at home    4.History of pulmonary embolism and paroxysmal A. Fib  -Continue with Xarelto with bleeding precautions    5.Hypothyroidism on Synthroid, check  TSH    6.Hyperlipidemia on Lipitor, lipid panel    7.Hypertension on amlodipine, Imdur, lisinopril, metoprolol  -Blood pressure stable on admission    8.GERD: On Protonix    9.Depression: on Celexa    10.Gout: On allopurinol    11. History of myocardial infarction x2 (2010 and 2019)    12.  Bilateral lower leg edema with a normal BNP: Chest x-ray is not showing any radiographical evidence of pulmonary edema.  We will hold off on any IV fluids and monitor leg swelling.  She has taken Chorthialidone in the past ( will hold incase of cardiac cath)

## 2018-07-04 NOTE — Plan of Care (Signed)
Problem: Moderate/High Fall Risk Score >5  Goal: Patient will remain free of falls  Outcome: Adequate for Discharge  Flowsheets (Taken 07/04/2018 1426)  Moderate Risk (6-13): LOW-Fall Interventions Appropriate for Low Fall Risk; LOW-Anticoagulation education for injury risk     Problem: Chest Pain  Goal: Vital signs and cardiac rhythm stable  Outcome: Adequate for Discharge  Flowsheets (Taken 07/04/2018 0448 by Jiles Garter, RN)  Vital signs and cardiac rhythm stable: Monitor /assess vital signs/cardiac rhythms;Monitor labs;Assess the need for oxygen therapy and administer as ordered  Goal: Cardiac pain management  Outcome: Adequate for Discharge  Flowsheets (Taken 07/04/2018 0448 by Jiles Garter, RN)  Cardiac pain management : Assess/report chest pain/or related discomfort to LIP immediately;Instruct patient to report any change in pain status;Assess pain/or related discomfort on admission, during daily assessment, before and after any intervention;Include patient and patient care companion in decisions related to pain management  Goal: Anxiety management/effective coping  Outcome: Adequate for Discharge  Flowsheets (Taken 07/04/2018 1426)  Anxiety management/effective coping : Offer reassurance to decrease anxiety; Include patient in decision making of their care and give updates on their health status; Encourage patient to immediately report any increase in anxiety and/or depression; Assess/report uncontrolled anxiety, depression or ineffective coping to LIP

## 2018-07-04 NOTE — Consults (Signed)
[facility] CONSULTATION REPORT      Date Time: 07/04/18 9:35 AM  Patient Name: [name]  Requesting Physician: Judson Roch, MD     Reason for Consultation:   Chest pain    History:   [name] is a 68 y.o. female admitted on 07/03/2018.  We have been asked by Judson Roch, MD,  to provide cardiac consultation, regarding chest pain.  The patient has a history of complex coronary artery disease as detailed below.  She came to the emergency room after experiencing mild to moderate intensity chest pain associated with an elevated irregular heartbeat of over 100 beats a minute.  The pain lasted only for the duration of the abnormal heartbeat and resolved afterwards.  This was different from angina that she has had in the past.  There is no other associated alleviating or aggravating factors.  She did follow-up with her primary cardiologist after the January hospitalization.  She tells me an echocardiogram was done that was reassuring.  A stress test was not performed.  She was told that she has small vessels that are not amenable to stenting per her primary cardiologist.  The patient is currently asymptomatic.  Electrocardiogram shows nonspecific ST changes that may be related to left ventricular hypertrophy.  Cardiac troponin levels are within normal limits x2.  Rate and rhythm are sinus and stable.    Past Medical History:     Past Medical History:   Diagnosis Date    Anxiety and depression     CAD in native artery     "11 stents"    Carpal tunnel syndrome 05/30/2007    Cervical myelopathy 05/07/2009    Cervical spondylosis 07/30/2009    Chronic anemia     Chronic cough 01/04/2017    Degeneration of lumbar or lumbosacral intervertebral disc 05/29/2007    Encounter for blood transfusion     Essential hypertension     GERD without esophagitis     Gout 10/07/2012    H/O right coronary artery stent placement 08/19/2011    Hemorrhoids 01/04/2017    Hiatal hernia 09/20/2017     History of pertussis     Hypothyroidism, unspecified type     02/25/2018 TSH 0.96, free T4 1.10 (both normal).    Influenza vaccination given 11/29/2017    Lumbar disc herniation 07/10/2007    Lumbar spondylolysis 07/30/2009    Migraine with prolonged aura     Migraine without aura and without status migrainosus, not intractable 07/17/2015    Mixed hyperlipidemia     05/21/2018 lipid panel: Chol 147, TG 151 (H), HDL 35 (L), LDL 82.    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    Unstable angina 08/19/2015    Ventricular tachycardia 12/30/2017    Xerostomia 01/04/2017       Past Surgical History:     Past Surgical  History:   Procedure Laterality Date    ABDOMINAL MASS RESECTION      CHOLECYSTECTOMY      CORONARY ANGIOPLASTY WITH STENT PLACEMENT  07/29/2014    LAD DES (Xience) 2.25 x 15 mm      CORONARY ANGIOPLASTY WITH STENT PLACEMENT  01/13/2017    3 mm x 20 mm drug-eluting Synergy stent in proximal circumflex     HERNIA REPAIR      LUMBAR FUSION      PARTIAL HYSTERECTOMY      TONSILLECTOMY      TOTAL KNEE ARTHROPLASTY Right     TOTAL SHOULDER ARTHROPLASTY Right        Family History:     Family History   Problem Relation Age of Onset    Heart attack Sister 3        Fatal MI age 58    Anesthesia problems Sister     Clotting disorder Sister     Heart attack Maternal Aunt     Heart attack Paternal Aunt     Coronary artery disease Mother     Kidney failure Mother          ESRD on dialysis    Stroke Mother     Heart attack Brother     Cancer Brother     Coronary artery disease Paternal Grandmother     Breast cancer Cousin     Breast cancer Niece        Social History:     Social History     Socioeconomic History    Marital status: Divorced     Spouse name: Not on file    Number of children: Not on file    Years of education: Not on file    Highest education level: Not on file   Occupational History    Not on file   Social Needs    Financial resource strain: Not on file    Food insecurity:     Worry: Not on file     Inability: Not on file    Transportation needs:     Medical: Not on file     Non-medical: Not on file   Tobacco Use    Smoking status: Never Smoker    Smokeless tobacco: Never Used   Substance and Sexual Activity    Alcohol use: No    Drug use: No    Sexual activity: Not on file   Lifestyle    Physical activity:     Days per week: Not on file     Minutes per session: Not on file    Stress: Not on file   Relationships    Social connections:     Talks on phone: Not on file     Gets together: Not on file     Attends religious service: Not on file     Active member of club or organization: Not on file     Attends meetings of clubs or organizations: Not on file     Relationship status: Not on file    Intimate partner violence:     Fear of current or ex partner: Not on file     Emotionally abused: Not on file     Physically abused: Not on file     Forced sexual activity: Not on file   Other Topics Concern    Not on file   Social History Narrative    Not on file  Allergies:     Allergies   Allergen Reactions    Iodine Swelling and Rash    Red Dye Swelling, Rash and Respiratory Distress    Walnuts [Tree Nuts] Swelling     Tongue swelling    Zoster Vaccine Live Anaphylaxis    Codeine Nausea And Vomiting     N/V stomach upset; pt able to tolerate morphine and oxycodone without reaction    Tramadol Nausea And Vomiting     N/V stomach upset        Medications:     Medications Prior to Admission   Medication Sig    allopurinol (ZYLOPRIM) 300 MG tablet Take 300 mg by mouth daily       amLODIPine (NORVASC) 10 MG tablet Take 10 mg by mouth daily.    atorvastatin (LIPITOR) 80 MG tablet Take 80 mg by mouth daily.    chlorthalidone (HYGROTEN) 25 MG tablet Take 1 tablet (25 mg total) by mouth every other day    clopidogrel (PLAVIX) 75 mg tablet Take 75 mg by mouth nightly.       ferrous sulfate 325 (65 FE) MG tablet Take 325 mg by mouth every morning with breakfast    isosorbide mononitrate (IMDUR) 30 MG 24 hr tablet Take 2 tablets (60 mg total) by mouth daily (Patient taking differently: Take 30 mg by mouth daily   )    levothyroxine (SYNTHROID, LEVOTHROID) 75 MCG tablet Take 75 mcg by mouth Once a day at 6:00am.    lisinopril (PRINIVIL,ZESTRIL) 10 MG tablet Take 10 mg by mouth daily    metoprolol succinate XL (TOPROL-XL) 25 MG 24 hr tablet Take 1 tablet (25 mg total) by mouth daily    montelukast (SINGULAIR) 10 MG tablet Take 10 mg by mouth nightly.    pantoprazole (PROTONIX) 40 MG tablet Take 40 mg by mouth daily.        ranolazine (RANEXA) 500 MG 12 hr tablet Take 1 tablet (500 mg total) by mouth every 12 (twelve) hours History of allergy to red dye but tolerated Renexa in the hospital    rivaroxaban (XARELTO) 10 MG Tab Take 20 mg by mouth daily with dinner       aspirin EC 81 MG EC tablet Take 81 mg by mouth daily    citalopram (CELEXA) 40 MG tablet Take 40 mg by mouth daily.    oxyCODONE (ROXICODONE) 5 MG immediate release tablet Take 1 tablet (5 mg total) by mouth every 4 (four) hours as needed for Pain.    PROAIR HFA 108 (90 Base) MCG/ACT inhaler 2 puffs every 6 (six) hours as needed.            Current Facility-Administered Medications   Medication Dose Route Frequency    allopurinol  300 mg Oral Daily    amLODIPine  10 mg Oral Daily    atorvastatin  80 mg Oral Daily    citalopram  40 mg Oral Daily    clopidogrel  75 mg Oral QHS     isosorbide mononitrate  60 mg Oral Daily    levothyroxine  75 mcg Oral Daily at 0600    lisinopril  10 mg Oral Daily    metoprolol succinate XL  25 mg Oral Daily    montelukast  10 mg Oral QHS    pantoprazole  40 mg Oral QAM AC    ranolazine  500 mg Oral Q12H SCH    rivaroxaban  20 mg Oral Daily with dinner  Review of Systems:   Comprehensive review of systems including constitutional, eyes, ears, nose, mouth, throat, cardiovascular, GI, GU, musculoskeletal, integumentary, respiratory, neurologic, psychiatric, and endocrine was reviewed in the medical record and is negative other than what is mentioned already in the history of present illness    Physical Exam:     VITAL SIGNS PHYSICAL EXAM   Vitals:    07/04/18 0353 07/04/18 0413 07/04/18 0533 07/04/18 0854   BP: 134/65 149/62  142/68   Pulse: 82 78 77 80   Resp: 15 18  17    Temp: 97.9 F (36.6 C) 97.5 F (36.4 C)  97.7 F (36.5 C)   TempSrc:  Temporal  Tympanic   SpO2: 94% 95%  94%   Weight:           Intake and Output Summary (Last 24 hours) at Date Time    Intake/Output Summary (Last 24 hours) at 07/04/2018 0935  Last data filed at 07/04/2018 0340  Gross per 24 hour   Intake 711.25 ml   Output    Net 711.25 ml       Telemetry:    Physical Exam    General: Non-toxic appearing, no acute distress  Skin: Non-jaundiced, no pallor  HEENT: Normocephalic, atraumatic  Neck: No JVD  Cardiac: Normal rate, regular rhythm, no murmurs  Lungs: Normal respiratory effort, not tachypneic, no accessory muscle use  Abdomen: Non-distended, obese  Extremities: No edema, no cyanosis  Musculoskeletal: Moves all 4 extremities; gait unable to be assessed due to illness  Neuro/psych: Awake, alert, and oriented x3     Labs Reviewed:     Recent Labs   Lab 07/04/18  0550 07/04/18  0011   Creatine Kinase (CK) 31  --    Troponin I 0.01 0.02             Recent Labs   Lab 07/04/18  0550   Bilirubin, Total 0.8   Protein, Total 6.8   Albumin 4.0   ALT 16   AST (SGOT) 17     Recent  Labs   Lab 07/04/18  0550   Magnesium 2.4     Recent Labs   Lab 07/04/18  0011   PT 14.7   PT INR 1.2*   PTT 35     Recent Labs   Lab 07/04/18  0550 07/04/18  0011   WBC 5.68 7.41   Hgb 11.9 12.9   Hematocrit 36.8 38.5   Platelets 156 171     Recent Labs   Lab 07/04/18  0550 07/03/18  2339   Sodium 140 140   Potassium 4.0 4.2   Chloride 105 107   CO2 24 22   BUN 19.6* 21.7*   Creatinine 1.0 1.0   EGFR 55.2 55.2   Glucose 92 113*   Calcium 8.8 8.9       DIAGNOSTIC    Radiological procedures personally reviewed:   Xr Chest  Ap Portable    Result Date: 07/03/2018  Suboptimal exam due to AP technique, body habitus and low lung volumes. Questionable opacities at the lung bases, either atelectasis or pneumonia. Johnsie Kindred, MD 07/03/2018 11:46 PM      ECG:     Assessment:    CAD with by report multiple stents in place since 2010, last one placed June 2019.  Her last cath in Oct 2019 showed patent stents, no major epicardial disease.  1. IMI 2010 with multiple stents to RCA  2. Staged intervention to LAD  and diagonal shortly thereafter  3. 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 Multiple presentations with chest pain thereafter - numerous MPI studies and caths which were unrevealing  5. LAD stent 2016 in [place]  6.We saw her here for chest pain 03/2015: Lexiscan MPI was normal then.  7.However 2 additional stents at [facility] 07/2016 with Dr. Irene Shipper  8. Then one additional stent at [facility] in [facility] 2019  9.  Repeat stent placement June 2019 at [facility] (she reports she has a total of 11 stents)  10. Cardiac catheterization October 2019 at [facility] done by Dr Willa Rough (her regular cardiologist) - patent epicardial vessels with patent stents and branch vessels.     PAF on Xarelto   HTN, high chol    Recommendations:    The patient is asymptomatic in this normal rhythm and no evidence of acute coronary syndrome.  It is possible that her chest pain was  from an episode of atrial fibrillation from her description of elevated irregular heartbeat.  Perhaps A. fib needs to be treated more aggressively, but we will leave this to her primary cardiologist.  In the absence of acute coronary syndrome, inpatient cardiac testing is not necessary.  She can continue her current medical therapy and follow-up with her primary cardiologist for further management.                Sherlean Foot, MD  [employer]    CONTACT INFO for [facility] Coverage     [facility]  APP Spectralink [phone] (8am -5pm)  MD Spectralink [phone] or [phone] (8am-5pm)  EP/Arrhythmia Spectralink [phone] (8am-4:30pm)  After hours, nonurgent consult line [phone]  After hours, urgent consults [phone]      [facility]  APP Spectralink [phone] (8am-5pm)  MD Spectralink [phone] (8am-5pm)  After hours, non urgent consult line [phone]  After Hours, urgent consults [phone] [facility]  APP Spectralink [phone] (8am-5pm)  MD Spectralink [phone] (8am-5pm)  After hours, non urgent consult line [phone]  After Hours, urgent consults [phone]      [facility]  APP Spectralink [phone] (8am-5pm)  MD Spectralink [phone] (8am-5pm)  After hours, non urgent consult line [phone]  After Hours, urgent consults [phone]

## 2018-07-04 NOTE — Discharge Instr - AVS First Page (Signed)
Reason for your Hospital Admission:  Atypical chest pain likely due to atrial fibrillation: Ruled out myocardial infarction

## 2018-07-04 NOTE — ED Notes (Signed)
Nursing Communication - Admission Information:  Diagnosis:   1. Chest pain, unspecified type       Mobility: Independent  Isolation: No active isolations  Patient comes from: Home  Neuro: AAOx4  Special needs: None  Drips: None  Current Facility-Administered Medications   Medication Dose Route Frequency Last Rate    sodium chloride   Intravenous Continuous

## 2018-07-04 NOTE — Plan of Care (Signed)
Problem: Chest Pain  Goal: Vital signs and cardiac rhythm stable  Outcome: Progressing  Flowsheets (Taken 07/04/2018 0448)  Vital signs and cardiac rhythm stable: Monitor /assess vital signs/cardiac rhythms; Monitor labs; Assess the need for oxygen therapy and administer as ordered  Goal: Cardiac pain management  Outcome: Progressing  Flowsheets (Taken 07/04/2018 0448)  Cardiac pain management : Assess/report chest pain/or related discomfort to [facility] immediately; Instruct patient to report any change in pain status; Assess pain/or related discomfort on admission, during daily assessment, before and after any intervention; Include patient and patient care companion in decisions related to pain management

## 2018-07-04 NOTE — Progress Notes (Signed)
Patient arrived on unit via stretcher. AxOX4. C/o 5/110 chestpain. 0.4mg  of nitro given. VSS. Patient is currently in bed. Belongings and call bell within reach. Safety precautions initiated

## 2018-07-04 NOTE — H&P (Addendum)
[facility]  H&P    Patient Info:   Date/Time: 07/04/2018 / 4:44 AM   Admit Date:07/03/2018  Patient Name:[name]   ZOX:[id number]   PCP: Lennox Pippins, MD  Attending Physician:Nessie Nong, MD     Assessment and Plan:     1.  Chest pain:  - 4 baby aspirin by EMS we will not continue since she is on Plavix  -She reports a history of 11 cardiac stents and significant CAD with a history of 2 MI's in the past.  -We will cycle troponins first is negative, stable EKG  -Last echocardiogram was 2018 EF of 69% with grade 1 diastolic dysfunction  -She reports an abnormal stress test in 2019 therefore had a cardiac cath and a stent was placed in the widow maker   -Consult [place] [facility]  X-ray showing questionable opacities favoring atelectasis  -Continue with Ranexa  -Morphine/nitro PRN/oxycodone  - NPO incase of cardiac cath  -Repeat EKG in the AM    2.Hypomagnesia: Repleted. repeat labs in the morning  -Potassium is normal    3.CAD with history of 11 stents: She is taking Plavix at home    4.History of pulmonary embolism and paroxysmal A. Fib  -Continue with Xarelto with bleeding precautions    5.Hypothyroidism on Synthroid, check TSH    6.Hyperlipidemia on Lipitor, lipid panel    7.Hypertension on amlodipine, Imdur, lisinopril, metoprolol  -Blood pressure stable on admission    8.GERD: On Protonix    9.Depression: on Celexa    10.Gout: On allopurinol    11. History of myocardial infarction x2 (2010 and 2019)    12.  Bilateral lower leg edema with a normal BNP: Chest x-ray is not showing any radiographical evidence of pulmonary edema.  We will hold off on any IV fluids and monitor leg swelling.  She has taken Chorthialidone in the past ( will hold incase of cardiac cath)    Hospital Problems:  Principal Problem:    Chest pain  Active Problems:    CAD in native artery    Essential hypertension    Mixed hyperlipidemia    History of pulmonary embolism    Hypothyroidism,  unspecified type    GERD without esophagitis    Obstructive sleep apnea on CPAP    Hypomagnesemia    Acute cystitis without hematuria    Paroxysmal atrial fibrillation    DVT Prohylaxis:SEDs   Code Status: Full Code  Disposition:home  Type of Admission:Observation  Estimated Length of Stay (including stay in the ER receiving treatment): less than 2 midnights  Foley and Central Lines: none  Medical Necessity for stay: see above  Milestones: see above    Clinical Presentation:   History of Presenting Illness:     Past Surgical History:   Procedure Laterality Date    ABDOMINAL MASS RESECTION      CHOLECYSTECTOMY      CORONARY ANGIOPLASTY WITH STENT PLACEMENT  07/29/2014    LAD DES (Xience) 2.25 x 15 mm      CORONARY ANGIOPLASTY WITH STENT PLACEMENT  01/13/2017    3 mm x 20 mm drug-eluting Synergy stent in proximal circumflex     HERNIA REPAIR      LUMBAR FUSION      PARTIAL HYSTERECTOMY      TONSILLECTOMY      TOTAL KNEE ARTHROPLASTY Right     TOTAL SHOULDER ARTHROPLASTY Right       Past Medical History:   Diagnosis Date  Anxiety and depression     CAD in native artery     "11 stents"    Carpal tunnel syndrome 05/30/2007    Cervical myelopathy 05/07/2009    Cervical spondylosis 07/30/2009    Chronic anemia     Chronic cough 01/04/2017    Degeneration of lumbar or lumbosacral intervertebral disc 05/29/2007    Encounter for blood transfusion     Essential hypertension     GERD without esophagitis     Gout 10/07/2012    H/O right coronary artery stent placement 08/19/2011    Hemorrhoids 01/04/2017    Hiatal hernia 09/20/2017    History of pertussis     Hypothyroidism, unspecified type     02/25/2018 TSH 0.96, free T4 1.10 (both normal).    Influenza vaccination given 11/29/2017    Lumbar disc herniation 07/10/2007    Lumbar spondylolysis 07/30/2009    Migraine with prolonged aura     Migraine without aura and without status migrainosus, not intractable 07/17/2015    Mixed hyperlipidemia      05/21/2018 lipid panel: Chol 147, TG 151 (H), HDL 35 (L), LDL 82.    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  She states pain is radiating from her left chest to her left jaw and neck and has right arm numbness and slight heaviness.  It is described as pressure/tightness, 6/10, radiating, exertional, pleuritic associated with shortness of breath, sweatiness, clamminess, palpitations, nausea without vomiting. Did not take any mediations at home for the pain. 4 baby ASA given by EMS.  Symptoms are associated with severe weakness and fatigue.  Symptoms are made better with nitro and morphine and are worse when taking a deep breath.  Chest pain is the same when ambulating.  She states she had a normal stress test and June 2019 therefore she was given a cardiac cath and had 1 stent placed in the widow maker.  Her last cardiac cath was October 2019 at [facility].  She has a history of 11 cardiac stents and a significant cardiac history including PE, A. fib, hypertension and CAD.  Her last echocardiogram in 2018 showing EF of 69% with grade 1 diastolic dysfunction.  She follows with [place] [facility].  She has a chronic  cough for the last 3 months possibly due to lisinopril.  No phlegm no wheezing.  No trouble breathing.  She has leg swelling starting at 12:00 today.  She takes chlorthalidone PRN ( 2 X per week) for the leg swelling.  She has orthopnea.  She states lesion losing weight.  No COVID contacts  or sick contacts.   Denies any fever, chills, trouble breathing, abdominal pain, urinary complaints, melena, or other.      Review of Systems:   A comprehensive review of systems was negative except for the underlying highlighted in Bold     Constitutional: chills, weight loss, malaise/fatigue and diaphoresis.   HENT: hearing loss, ear pain, nosebleeds, congestion, sore throat, neck pain, tinnitus and ear discharge.   Eyes: blurred vision, double vision, photophobia, pain, discharge and redness.   Respiratory: (chronic cough) hemoptysis, sputum production, shortness of breath, wheezing and stridor.   Cardiovascular: chest pain, palpitations, orthopnea, claudication, leg swelling and PND.   Gastrointestinal: heartburn, nausea, vomiting, abdominal pain, diarrhea, constipation, blood in stool and melena.   Genitourinary: dysuria, urgency, frequency, hematuria and flank pain.   Musculoskeletal: myalgias, back pain, joint pain and falls.   Skin: itching and rash.   Neurological: dizziness, tingling, tremors, sensory change, speech change, focal weakness, seizures, loss of consciousness, weakness and headaches.   Endo/Heme/Allergies: environmental allergies and polydipsia. Does not bruise/bleed easily.   Psychiatric/Behavioral: depression, suicidal ideas, hallucinations, memory loss and substance abuse. The patient is not nervous/anxious and does not have insomnia.     Physical Exam:     Vitals:    07/04/18 0141 07/04/18 0233 07/04/18 0353 07/04/18 0413   BP: 146/81 137/75 134/65 149/62   Pulse: 87 87 82 78   Resp:   15 18   Temp: 98.6 F (37 C)  97.9 F (36.6 C) 97.5 F (36.4 C)   TempSrc: Temporal   Temporal   SpO2: 95% 95% 94%  95%   Weight:           Constitutional: Patient is oriented to person, place, and time. Patient appears well-developed and well-nourished.   Head: Normocephalic and atraumatic.  Eyes- pupils equal and reactive, extraocular eye movements intact, sclera anicteric  Ears - external ear canals normal, right ear normal, left ear normal  Nose - normal and patent, no erythema, discharge or polyps and normal nontender sinuses  Mouth - mucous membranes dry, pharynx normal without lesions  Neck: Normal range of motion. Neck supple. No JVD present. No bruit or thrill noted on bilateral carotids. No tracheal deviation present. No thyromegaly present.   Cardiovascular: Normal rate, regular rhythm, normal heart sounds and intact distal pulses. Exam reveals no gallop and no friction rub. No murmur heard. +2 leg edema  2/10 left sided radiation cp into left neck and jaw, right arm slight numbness without weakness.  Pulmonary/Chest: Effort normal and breath sounds normal. No stridor. No respiratory distress. Patient has no wheezes. No crackles were present. Exhibits no tenderness on palpation. Diminished lungs  Abdominal: Soft. Bowel sounds are normal. Patient exhibits no distension and no mass was palpable. There is no tenderness. There is no rebound and no guarding.   Musculoskeletal: Normal range of motion. Patient exhibits no edema and no tenderness.   Lymphadenopathy: Patient has no cervical adenopathy.   Neurological: Patient is alert and oriented to person, place, and time and has normal reflexes. No cranial nerve deficit. Normal muscle tone. Coordination normal.   Skin: Skin is warm. No rash noted. Patient is not diaphoretic. No erythema. No pallor.   Psychiatric: Has normal mood and affect. Behavior is normal. Judgment and thought content normal    Physical Exam     Clinical Information and History:   Chief Complaint:  Chief Complaint   Patient presents with    Chest Pain     Past  Medical History:  Past Medical History:    Diagnosis Date    Anxiety and depression     CAD in native artery     "11 stents"    Carpal tunnel syndrome 05/30/2007    Cervical myelopathy 05/07/2009    Cervical spondylosis 07/30/2009    Chronic anemia     Chronic cough 01/04/2017    Degeneration of lumbar or lumbosacral intervertebral disc 05/29/2007    Encounter for blood transfusion     Essential hypertension     GERD without esophagitis     Gout 10/07/2012    H/O right coronary artery stent placement 08/19/2011    Hemorrhoids 01/04/2017    Hiatal hernia 09/20/2017    History of pertussis     Hypothyroidism, unspecified type     02/25/2018 TSH 0.96, free T4 1.10 (both normal).    Influenza vaccination given 11/29/2017    Lumbar disc herniation 07/10/2007    Lumbar spondylolysis 07/30/2009    Migraine with prolonged aura     Migraine without aura and without status migrainosus, not intractable 07/17/2015    Mixed hyperlipidemia     05/21/2018 lipid panel: Chol 147, TG 151 (H), HDL 35 (L), LDL 82.    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    Unstable angina 08/19/2015    Ventricular tachycardia 12/30/2017    Xerostomia 01/04/2017     Past Surgical History:  Past Surgical History:   Procedure Laterality Date    ABDOMINAL MASS RESECTION      CHOLECYSTECTOMY      CORONARY ANGIOPLASTY WITH STENT PLACEMENT  07/29/2014    LAD DES (Xience) 2.25 x 15 mm      CORONARY ANGIOPLASTY WITH STENT PLACEMENT  01/13/2017     3 mm x 20 mm drug-eluting Synergy stent in proximal circumflex     HERNIA REPAIR      LUMBAR FUSION      PARTIAL HYSTERECTOMY      TONSILLECTOMY      TOTAL KNEE ARTHROPLASTY Right     TOTAL SHOULDER ARTHROPLASTY Right      Family History:  Family History   Problem Relation Age of Onset    Heart attack Sister 29        Fatal MI age 16    Anesthesia problems Sister     Clotting disorder Sister     Heart attack Maternal Aunt     Heart attack Paternal Aunt     Coronary artery disease Mother     Kidney failure Mother         ESRD on dialysis    Stroke Mother     Heart attack Brother     Cancer Brother     Coronary artery disease Paternal Grandmother     Breast cancer Cousin     Breast cancer Niece  Social History:  Social History     Substance and Sexual Activity   Alcohol Use No     Social History     Substance and Sexual Activity   Drug Use No     Social History     Tobacco Use   Smoking Status Never Smoker   Smokeless Tobacco Never Used     Social History     Socioeconomic History    Marital status: Divorced     Spouse name: Not on file    Number of children: Not on file    Years of education: Not on file    Highest education level: Not on file   Occupational History    Not on file   Social Needs    Financial resource strain: Not on file    Food insecurity:     Worry: Not on file     Inability: Not on file    Transportation needs:     Medical: Not on file     Non-medical: Not on file   Tobacco Use    Smoking status: Never Smoker    Smokeless tobacco: Never Used   Substance and Sexual Activity    Alcohol use: No    Drug use: No    Sexual activity: Not on file   Lifestyle    Physical activity:     Days per week: Not on file     Minutes per session: Not on file    Stress: Not on file   Relationships    Social connections:     Talks on phone: Not on file     Gets together: Not on file     Attends religious service: Not on file     Active member of club or organization: Not on file      Attends meetings of clubs or organizations: Not on file     Relationship status: Not on file    Intimate partner violence:     Fear of current or ex partner: Not on file     Emotionally abused: Not on file     Physically abused: Not on file     Forced sexual activity: Not on file   Other Topics Concern    Not on file   Social History Narrative    Not on file     Allergies:  Allergies   Allergen Reactions    Iodine Swelling and Rash    Red Dye Swelling, Rash and Respiratory Distress    Walnuts [Tree Nuts] Swelling     Tongue swelling    Zoster Vaccine Live Anaphylaxis    Codeine Nausea And Vomiting     N/V stomach upset; pt able to tolerate morphine and oxycodone without reaction    Tramadol Nausea And Vomiting     N/V stomach upset     Medications:  Medications Prior to Admission   Medication Sig Dispense Refill Last Dose    allopurinol (ZYLOPRIM) 300 MG tablet Take 300 mg by mouth daily      02/24/2018 at Unknown time    amLODIPine (NORVASC) 10 MG tablet Take 10 mg by mouth daily.  1 Past Month at Unknown time    atorvastatin (LIPITOR) 80 MG tablet Take 80 mg by mouth daily.   02/24/2018 at Unknown time    chlorthalidone (HYGROTEN) 25 MG tablet Take 1 tablet (25 mg total) by mouth every other day       citalopram (CELEXA) 40 MG tablet Take 40  mg by mouth daily.   02/24/2018 at Unknown time    clopidogrel (PLAVIX) 75 mg tablet Take 75 mg by mouth nightly.      02/24/2018 at Unknown time    isosorbide mononitrate (IMDUR) 30 MG 24 hr tablet Take 2 tablets (60 mg total) by mouth daily       levothyroxine (SYNTHROID, LEVOTHROID) 75 MCG tablet Take 75 mcg by mouth Once a day at 6:00am.   02/25/2018 at Unknown time    lisinopril (PRINIVIL,ZESTRIL) 10 MG tablet Take 10 mg by mouth daily   Past Week at Unknown time    metoprolol succinate XL (TOPROL-XL) 25 MG 24 hr tablet Take 1 tablet (25 mg total) by mouth daily       montelukast (SINGULAIR) 10 MG tablet Take 10 mg by mouth nightly.  3 02/24/2018 at  Unknown time    oxyCODONE (ROXICODONE) 5 MG immediate release tablet Take 1 tablet (5 mg total) by mouth every 4 (four) hours as needed for Pain. 30 tablet 0 02/24/2018 at Unknown time    pantoprazole (PROTONIX) 40 MG tablet Take 40 mg by mouth daily.       02/25/2018 at Unknown time    PROAIR HFA 108 (90 Base) MCG/ACT inhaler 2 puffs every 6 (six) hours as needed.       Past Week at Unknown time    ranolazine (RANEXA) 500 MG 12 hr tablet Take 1 tablet (500 mg total) by mouth every 12 (twelve) hours History of allergy to red dye but tolerated Renexa in the hospital 60 tablet 0     rivaroxaban (XARELTO) 10 MG Tab Take 20 mg by mouth daily with dinner      02/24/2018 at Unknown time     Results of Labs/imaging   Labs have been reviewed:   Coagulation Profile:   Recent Labs   Lab 07/04/18  0011   PT 14.7   PT INR 1.2*   PTT 35       CBC review:   Recent Labs   Lab 07/04/18  0011   WBC 7.41   Hgb 12.9   Hematocrit 38.5   Platelets 171   MCV 94.4   RDW 13   Neutrophils 60.8   Lymphocytes Automated 26.3   Eosinophils Automated 1.1   Immature Granulocyte 0.3   Neutrophils Absolute 4.51   Absolute Immature Granulocyte 0.02     Chem Review:  Recent Labs   Lab 07/03/18  2339   Sodium 140   Potassium 4.2   Chloride 107   CO2 22   BUN 21.7*   Creatinine 1.0   Glucose 113*   Calcium 8.9   Magnesium 1.4*   Bilirubin, Total <1.0   AST (SGOT) 21   ALT 19   Alkaline Phosphatase 76     Results     Procedure Component Value Units Date/Time    UA Reflex to Micro - Reflex to Culture [[id number]]  (Abnormal) Collected:  07/04/18 0107     Updated:  07/04/18 4259     Urine Type Urine, Clean Ca     Color, UA Yellow     Clarity, UA Clear     Specific Gravity UA 1.023     Urine pH 5.0     Leukocyte Esterase, UA Trace     Nitrite, UA Negative     Protein, UR Negative     Glucose, UA Negative     Ketones UA Negative     Urobilinogen, UA Normal mg/dL  Bilirubin, UA Negative     Blood, UA Negative     RBC, UA 0-2 /hpf      WBC, UA 6-10 /hpf       Squamous Epithelial Cells, Urine 0-5 /hpf      Urine Mucus Present    Narrative:       Replace urinary catheter prior to obtaining the urine culture  if it has been in place for greater than or equal to 14  days:->N/A No Foley  Indications for U/A Reflex to Micro - Reflex to  Culture:->Suprapubic Pain/Tenderness or Dysuria    B-type Natriuretic Peptide [[id number]] Collected:  07/04/18 0011    Specimen:  Blood Updated:  07/04/18 0045     B-Natriuretic Peptide 25.1 pg/mL     Narrative:       Replace urinary catheter prior to obtaining the urine culture  if it has been in place for greater than or equal to 14  days:->N/A No Foley  Indications for U/A Reflex to Micro - Reflex to  Culture:->Suprapubic Pain/Tenderness or Dysuria    PT/APTT [[id number]]  (Abnormal) Collected:  07/04/18 0011     Updated:  07/04/18 0044     PT 14.7 sec      PT INR 1.2     PTT 35 sec     Narrative:       Replace urinary catheter prior to obtaining the urine culture  if it has been in place for greater than or equal to 14  days:->N/A No Foley  Indications for U/A Reflex to Micro - Reflex to  Culture:->Suprapubic Pain/Tenderness or Dysuria    Troponin I [[id number]] Collected:  07/04/18 0011    Specimen:  Blood Updated:  07/04/18 0044     Troponin I 0.02 ng/mL     Narrative:       Replace urinary catheter prior to obtaining the urine culture  if it has been in place for greater than or equal to 14  days:->N/A No Foley  Indications for U/A Reflex to Micro - Reflex to  Culture:->Suprapubic Pain/Tenderness or Dysuria    CBC and differential [[id number]] Collected:  07/04/18 0011    Specimen:  Blood Updated:  07/04/18 0039     WBC 7.41 x10 3/uL      Hgb 12.9 g/dL      Hematocrit 62.9 %      Platelets 171 x10 3/uL      RBC 4.08 x10 6/uL      MCV 94.4 fL      MCH 31.6 pg      MCHC 33.5 g/dL      RDW 13 %      MPV 11.1 fL      Neutrophils 60.8 %      Lymphocytes Automated 26.3 %      Monocytes 11.2 %      Eosinophils Automated 1.1 %      Basophils  Automated 0.3 %      Immature Granulocyte 0.3 %      Nucleated RBC 0.0 /100 WBC      Neutrophils Absolute 4.51 x10 3/uL      Abs Lymph Automated 1.95 x10 3/uL      Abs Mono Automated 0.83 x10 3/uL      Abs Eos Automated 0.08 x10 3/uL      Absolute Baso Automated 0.02 x10 3/uL      Absolute Immature Granulocyte 0.02 x10 3/uL      Absolute NRBC 0.00 x10 3/uL  Narrative:       Replace urinary catheter prior to obtaining the urine culture  if it has been in place for greater than or equal to 14  days:->N/A No Foley  Indications for U/A Reflex to Micro - Reflex to  Culture:->Suprapubic Pain/Tenderness or Dysuria    Comprehensive metabolic panel [[id number]]  (Abnormal) Collected:  07/03/18 2339    Specimen:  Blood Updated:  07/04/18 0031     Glucose 113 mg/dL      BUN 53.6 mg/dL      Creatinine 1.0 mg/dL      Sodium 644 mEq/L      Potassium 4.2 mEq/L      Chloride 107 mEq/L      CO2 22 mEq/L      Calcium 8.9 mg/dL      Protein, Total 7.2 g/dL      Albumin 4.0 g/dL      AST (SGOT) 21 U/L      ALT 19 U/L      Alkaline Phosphatase 76 U/L      Bilirubin, Total <1.0 mg/dL      Globulin 3.2 g/dL      Albumin/Globulin Ratio 1.3     Anion Gap 11.0    Narrative:       Replace urinary catheter prior to obtaining the urine culture  if it has been in place for greater than or equal to 14  days:->N/A No Foley  Indications for U/A Reflex to Micro - Reflex to  Culture:->Suprapubic Pain/Tenderness or Dysuria    Lipase [[id number]] Collected:  07/03/18 2339    Specimen:  Blood Updated:  07/04/18 0031     Lipase 28 U/L     Narrative:       Replace urinary catheter prior to obtaining the urine culture  if it has been in place for greater than or equal to 14  days:->N/A No Foley  Indications for U/A Reflex to Micro - Reflex to  Culture:->Suprapubic Pain/Tenderness or Dysuria    Magnesium [[id number]]  (Abnormal) Collected:  07/03/18 2339    Specimen:  Blood Updated:  07/04/18 0031     Magnesium 1.4 mg/dL     Narrative:       Replace urinary  catheter prior to obtaining the urine culture  if it has been in place for greater than or equal to 14  days:->N/A No Foley  Indications for U/A Reflex to Micro - Reflex to  Culture:->Suprapubic Pain/Tenderness or Dysuria    GFR [[id number]] Collected:  07/03/18 2339     Updated:  07/04/18 0031     EGFR 55.2    Narrative:       Replace urinary catheter prior to obtaining the urine culture  if it has been in place for greater than or equal to 14  days:->N/A No Foley  Indications for U/A Reflex to Micro - Reflex to  Culture:->Suprapubic Pain/Tenderness or Dysuria        Radiology reports have been reviewed:  Radiology Results (24 Hour)     Procedure Component Value Units Date/Time    XR Chest  AP Portable [[id number]] Collected:  07/03/18 2345    Order Status:  Completed Updated:  07/03/18 2350    Narrative:       TECHNIQUE:  AP chest    INDICATION: Chest pain    COMPARISON: 02/25/2018    FINDINGS:     Suboptimal exam due to AP technique, body habitus and low lung volumes.    Questionable opacities at the  lung bases, either atelectasis or  pneumonia.    No edema, pneumothorax or sizable pleural effusions. Enlarged  cardiopericardial silhouette, unchanged.       Impression:         Suboptimal exam due to AP technique, body habitus and low lung volumes.    Questionable opacities at the lung bases, either atelectasis or  pneumonia.    Johnsie Kindred, MD   07/03/2018 11:46 PM        EKG: EKG reviewed   Last EKG Result     None        Hospitalist   Signed by:   Unk Lightning  07/04/2018 4:44 AM    *This note was generated by the Epic EMR system/ Dragon speech recognition and may contain inherent errors or omissions not intended by the user. Grammatical errors, random word insertions, deletions, pronoun errors and incomplete sentences are occasional consequences of this technology due to software limitations. Not all errors are caught or corrected. If there are questions or concerns about the content of this note or  information contained within the body of this dictation they should be addressed directly with the author for clarification      I have directly reviewed the clinical findings, lab, imaging studies and management of this patient in detail. I have interviewed and examined the pt and agree with the documentation,  as recorded by NP.    CC:  Chief Complaint   Patient presents with    Chest Pain         Labs:   Results     Procedure Component Value Units Date/Time    Creatine Kinase (CK) [[id number]] Collected:  07/04/18 0550    Specimen:  Blood Updated:  07/04/18 0552    Troponin I [[id number]] Collected:  07/04/18 0550    Specimen:  Blood Updated:  07/04/18 0552    Magnesium [[id number]] Collected:  07/04/18 0550    Specimen:  Blood Updated:  07/04/18 0552    Comprehensive metabolic panel [[id number]] Collected:  07/04/18 0550    Specimen:  Blood Updated:  07/04/18 0552    CBC and differential [[id number]] Collected:  07/04/18 0550    Specimen:  Blood Updated:  07/04/18 0552    Lipid panel [[id number]] Collected:  07/04/18 0550    Specimen:  Blood Updated:  07/04/18 0552    Narrative:       Fasting specimen    UA Reflex to Micro - Reflex to Culture [[id number]]  (Abnormal) Collected:  07/04/18 0107     Updated:  07/04/18 0213     Urine Type Urine, Clean Ca     Color, UA Yellow     Clarity, UA Clear     Specific Gravity UA 1.023     Urine pH 5.0     Leukocyte Esterase, UA Trace     Nitrite, UA Negative     Protein, UR Negative     Glucose, UA Negative     Ketones UA Negative     Urobilinogen, UA Normal mg/dL      Bilirubin, UA Negative     Blood, UA Negative     RBC, UA 0-2 /hpf      WBC, UA 6-10 /hpf      Squamous Epithelial Cells, Urine 0-5 /hpf      Urine Mucus Present    Narrative:       Replace urinary catheter prior to obtaining the urine culture  if it has been in place for  greater than or equal to 14  days:->N/A No Foley  Indications for U/A Reflex to Micro - Reflex to  Culture:->Suprapubic Pain/Tenderness or Dysuria     B-type Natriuretic Peptide [[id number]] Collected:  07/04/18 0011    Specimen:  Blood Updated:  07/04/18 0045     B-Natriuretic Peptide 25.1 pg/mL     Narrative:       Replace urinary catheter prior to obtaining the urine culture  if it has been in place for greater than or equal to 14  days:->N/A No Foley  Indications for U/A Reflex to Micro - Reflex to  Culture:->Suprapubic Pain/Tenderness or Dysuria    PT/APTT [[id number]]  (Abnormal) Collected:  07/04/18 0011     Updated:  07/04/18 0044     PT 14.7 sec      PT INR 1.2     PTT 35 sec     Narrative:       Replace urinary catheter prior to obtaining the urine culture  if it has been in place for greater than or equal to 14  days:->N/A No Foley  Indications for U/A Reflex to Micro - Reflex to  Culture:->Suprapubic Pain/Tenderness or Dysuria    Troponin I [[id number]] Collected:  07/04/18 0011    Specimen:  Blood Updated:  07/04/18 0044     Troponin I 0.02 ng/mL     Narrative:       Replace urinary catheter prior to obtaining the urine culture  if it has been in place for greater than or equal to 14  days:->N/A No Foley  Indications for U/A Reflex to Micro - Reflex to  Culture:->Suprapubic Pain/Tenderness or Dysuria    CBC and differential [[id number]] Collected:  07/04/18 0011    Specimen:  Blood Updated:  07/04/18 0039     WBC 7.41 x10 3/uL      Hgb 12.9 g/dL      Hematocrit 62.9 %      Platelets 171 x10 3/uL      RBC 4.08 x10 6/uL      MCV 94.4 fL      MCH 31.6 pg      MCHC 33.5 g/dL      RDW 13 %      MPV 11.1 fL      Neutrophils 60.8 %      Lymphocytes Automated 26.3 %      Monocytes 11.2 %      Eosinophils Automated 1.1 %      Basophils Automated 0.3 %      Immature Granulocyte 0.3 %      Nucleated RBC 0.0 /100 WBC      Neutrophils Absolute 4.51 x10 3/uL      Abs Lymph Automated 1.95 x10 3/uL      Abs Mono Automated 0.83 x10 3/uL      Abs Eos Automated 0.08 x10 3/uL      Absolute Baso Automated 0.02 x10 3/uL      Absolute Immature Granulocyte 0.02 x10 3/uL       Absolute NRBC 0.00 x10 3/uL     Narrative:       Replace urinary catheter prior to obtaining the urine culture  if it has been in place for greater than or equal to 14  days:->N/A No Foley  Indications for U/A Reflex to Micro - Reflex to  Culture:->Suprapubic Pain/Tenderness or Dysuria    Comprehensive metabolic panel [[id number]]  (Abnormal) Collected:  07/03/18 2339    Specimen:  Blood Updated:  07/04/18 0031  Glucose 113 mg/dL      BUN 91.4 mg/dL      Creatinine 1.0 mg/dL      Sodium 782 mEq/L      Potassium 4.2 mEq/L      Chloride 107 mEq/L      CO2 22 mEq/L      Calcium 8.9 mg/dL      Protein, Total 7.2 g/dL      Albumin 4.0 g/dL      AST (SGOT) 21 U/L      ALT 19 U/L      Alkaline Phosphatase 76 U/L      Bilirubin, Total <1.0 mg/dL      Globulin 3.2 g/dL      Albumin/Globulin Ratio 1.3     Anion Gap 11.0    Narrative:       Replace urinary catheter prior to obtaining the urine culture  if it has been in place for greater than or equal to 14  days:->N/A No Foley  Indications for U/A Reflex to Micro - Reflex to  Culture:->Suprapubic Pain/Tenderness or Dysuria    Lipase [[id number]] Collected:  07/03/18 2339    Specimen:  Blood Updated:  07/04/18 0031     Lipase 28 U/L     Narrative:       Replace urinary catheter prior to obtaining the urine culture  if it has been in place for greater than or equal to 14  days:->N/A No Foley  Indications for U/A Reflex to Micro - Reflex to  Culture:->Suprapubic Pain/Tenderness or Dysuria    Magnesium [[id number]]  (Abnormal) Collected:  07/03/18 2339    Specimen:  Blood Updated:  07/04/18 0031     Magnesium 1.4 mg/dL     Narrative:       Replace urinary catheter prior to obtaining the urine culture  if it has been in place for greater than or equal to 14  days:->N/A No Foley  Indications for U/A Reflex to Micro - Reflex to  Culture:->Suprapubic Pain/Tenderness or Dysuria    GFR [[id number]] Collected:  07/03/18 2339     Updated:  07/04/18 0031     EGFR 55.2    Narrative:        Replace urinary catheter prior to obtaining the urine culture  if it has been in place for greater than or equal to 14  days:->N/A No Foley  Indications for U/A Reflex to Micro - Reflex to  Culture:->Suprapubic Pain/Tenderness or Dysuria            Physical:  Vitals:    07/04/18 0233 07/04/18 0353 07/04/18 0413 07/04/18 0533   BP: 137/75 134/65 149/62    Pulse: 87 82 78 77   Resp:  15 18    Temp:  97.9 F (36.6 C) 97.5 F (36.4 C)    TempSrc:   Temporal    SpO2: 95% 94% 95%    Weight:         Intake and Output Summary (Last 24 hours) at Date Time    Intake/Output Summary (Last 24 hours) at 07/04/2018 0102  Last data filed at 07/04/2018 0340  Gross per 24 hour   Intake 711.25 ml   Output    Net 711.25 ml       Physical Exam  Constitutional:       General: She is not in acute distress.     Appearance: She is well-developed. She is not diaphoretic.   HENT:      Head: Normocephalic and atraumatic.  Nose: Nose normal.   Eyes:      General: No scleral icterus.     Conjunctiva/sclera: Conjunctivae normal.      Pupils: Pupils are equal, round, and reactive to light.   Neck:      Thyroid: No thyromegaly.      Vascular: No JVD.      Trachea: No tracheal deviation.   Cardiovascular:      Rate and Rhythm: Normal rate and regular rhythm.      Heart sounds: Normal heart sounds. No murmur. No friction rub. No gallop.    Pulmonary:      Effort: Pulmonary effort is normal. No respiratory distress.      Breath sounds: Normal breath sounds. No stridor. No wheezing or rales.   Chest:      Chest wall: No tenderness.   Abdominal:      General: Bowel sounds are normal. There is no distension.      Palpations: Abdomen is soft. There is no mass.      Tenderness: There is no abdominal tenderness. There is no guarding or rebound.   Musculoskeletal:         General: No swelling or tenderness.   Lymphadenopathy:      Cervical: No cervical adenopathy.   Skin:     General: Skin is warm.      Coloration: Skin is not pale.      Findings: No  erythema or rash.   Neurological:      General: No focal deficit present.      Mental Status: She is alert and oriented to person, place, and time.      Cranial Nerves: No cranial nerve deficit.      Sensory: No sensory deficit.      Motor: No weakness or abnormal muscle tone.   Psychiatric:         Behavior: Behavior normal.         Thought Content: Thought content normal.         Judgment: Judgment normal.       Assessment and plan:  1.  Chest pain: Check cardiac enzymes, monitor on telemetry, continue Plavix, Lipitor, metoprolol, Imdur, Ranexa, consult cardiology ([facility])  2.  CAD: Continue Plavix, Lipitor, metoprolol, Imdur and Ranexa  3.  Atrial fibrillation: Continue metoprolol, Xarelto, and monitor on telemetry  4.  Hypertension: Continue metoprolol, lisinopril and Norvasc  5.  Hyperlipidemia: Continue Lipitor  6.  Hypothyroid: Continue Synthroid  7.  Hypomagnesemia: Replete magnesium, recheck labs in a.m.  8.  GERD: Continue PPI  9.  Obstructive sleep apnea: CPAP as needed  10.  Depression: Continue Celexa  11.  History of pulmonary embolism: Continue Xarelto      Aleck Locklin, MD  07/04/2018  6:28 AM

## 2018-07-04 NOTE — Progress Notes (Signed)
AVS reviewed with patient. Patient has no further questions at this time. Patient verbalized understanding of discharge instructions and follow up information. IV removed with no bleeding noted. Vs stable and recorded. Awaiting patient's boyfriend to pick her up.

## 2018-07-05 LAB — ECG 12-LEAD
Atrial Rate: 104 {beats}/min
P Axis: 64 degrees
P-R Interval: 146 ms
Q-T Interval: 356 ms
QRS Duration: 80 ms
QTC Calculation (Bezet): 468 ms
R Axis: -10 degrees
T Axis: 35 degrees
Ventricular Rate: 104 {beats}/min

## 2018-07-10 NOTE — Final Progress Note (DC Note for stay less than 48 (Signed)
Date:07/10/2018   Patient Name: [name]  Attending Physician: No att. providers found  Today:   BP 105/50    Pulse (!) 54    Temp 98.3 F (36.8 C) (Tympanic)    Resp 18    Wt 108.6 kg (239 lb 6.7 oz)    SpO2 95%    BMI 39.84 kg/m   Ranges for the last 24 hours:       Date of Admission:   07/03/2018    Date of Discharge:   07/04/2018    Outcome of Hospitalization:   Principal Problem:    Chest pain ruled out MI  Active Problems:    CAD in native artery    Essential hypertension    Mixed hyperlipidemia    Gout    History of pulmonary embolism    Hypothyroidism, unspecified type    Depression    GERD without esophagitis    Obstructive sleep apnea on CPAP    Hypomagnesemia    Paroxysmal atrial fibrillation  Resolved Problems:    * No resolved hospital problems. 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  Serial cardiac biomarkers were obtained which were unremarkable and per cardiology consultation patient may likely have paroxysmal atrial fibrillation and currently heart rate controlled therefore patient can be discharged home and to follow-up in cardiology clinic within 1 week after discharge from the hospital.  At time of discharge patient was chest pain-free and hemodynamically stable.    Lab Results last 48 Hours     Procedure Component Value Units Date/Time    Troponin I [[id number]] Collected:  07/04/18 1218    Specimen:  Blood Updated:  07/04/18 1307     Troponin I <0.01 ng/mL     Lipid panel [[id number]]  (Abnormal) Collected:  07/04/18 0550    Specimen:  Blood Updated:  07/04/18 1016     Cholesterol 115 mg/dL      Triglycerides 782 mg/dL      HDL 32 mg/dL      LDL Calculated 59 mg/dL      VLDL Cholesterol Cal 24 mg/dL      CHOL/HDL Ratio 3.6     Hemolysis index [[id number]] Collected:  07/04/18 0550     Updated:  07/04/18 1016     Hemolysis Index 11    TSH [[id number]] Collected:  07/04/18 0550     Updated:  07/04/18 0738     TSH 2.32 uIU/mL     Magnesium [[id number]] Collected:  07/04/18 0550    Specimen:  Blood Updated:  07/04/18 0713     Magnesium 2.4 mg/dL     Comprehensive metabolic panel [[id number]]  (Abnormal) Collected:  07/04/18 0550    Specimen:  Blood Updated:  07/04/18 0713     Glucose 92 mg/dL      BUN 64.4 mg/dL      Creatinine 1.0 mg/dL      Sodium 034 mEq/L      Potassium 4.0 mEq/L      Chloride 105 mEq/L      CO2 24 mEq/L      Calcium 8.8 mg/dL      Protein, Total 6.8 g/dL      Albumin 4.0 g/dL      AST (SGOT) 17 U/L  ALT 16 U/L      Alkaline Phosphatase 76 U/L      Bilirubin, Total 0.8 mg/dL      Globulin 2.8 g/dL      Albumin/Globulin Ratio 1.4     Anion Gap 11.0    GFR [[id number]] Collected:  07/04/18 0550     Updated:  07/04/18 0713     EGFR 55.2    Creatine Kinase (CK) [[id number]] Collected:  07/04/18 0550    Specimen:  Blood Updated:  07/04/18 0711     Creatine Kinase (CK) 31 U/L     Troponin I [[id number]] Collected:  07/04/18 0550    Specimen:  Blood Updated:  07/04/18 0709     Troponin I 0.01 ng/mL     CBC and differential [[id number]]  (Abnormal) Collected:  07/04/18 0550    Specimen:  Blood Updated:  07/04/18 0646     WBC 5.68 x10 3/uL      Hgb 11.9 g/dL      Hematocrit 64.4 %      Platelets 156 x10 3/uL      RBC 3.83 x10 6/uL      MCV 96.1 fL      MCH 31.1 pg      MCHC 32.3 g/dL      RDW 13 %      MPV 11.1 fL      Neutrophils 51.8 %      Lymphocytes Automated 32.4 %      Monocytes 13.6 %      Eosinophils Automated 1.4 %      Basophils Automated 0.4 %      Immature Granulocyte 0.4 %      Nucleated RBC 0.0 /100 WBC      Neutrophils Absolute 2.95 x10 3/uL      Abs Lymph Automated 1.84 x10 3/uL      Abs Mono Automated 0.77 x10 3/uL      Abs Eos Automated 0.08 x10 3/uL      Absolute Baso Automated 0.02 x10 3/uL      Absolute Immature  Granulocyte 0.02 x10 3/uL      Absolute NRBC 0.00 x10 3/uL     UA Reflex to Micro - Reflex to Culture [[id number]]  (Abnormal) Collected:  07/04/18 0107     Updated:  07/04/18 6387     Urine Type Urine, Clean Ca     Color, UA Yellow     Clarity, UA Clear     Specific Gravity UA 1.023     Urine pH 5.0     Leukocyte Esterase, UA Trace     Nitrite, UA Negative     Protein, UR Negative     Glucose, UA Negative     Ketones UA Negative     Urobilinogen, UA Normal mg/dL      Bilirubin, UA Negative     Blood, UA Negative     RBC, UA 0-2 /hpf      WBC, UA 6-10 /hpf      Squamous Epithelial Cells, Urine 0-5 /hpf      Urine Mucus Present    Narrative:       Replace urinary catheter prior to obtaining the urine culture  if it has been in place for greater than or equal to 14  days:->N/A No Foley  Indications for U/A Reflex to Micro - Reflex to  Culture:->Suprapubic Pain/Tenderness or Dysuria    B-type Natriuretic Peptide [[id number]] Collected:  07/04/18 0011    Specimen:  Blood Updated:  07/04/18 0045     B-Natriuretic Peptide 25.1 pg/mL     Narrative:       Replace urinary catheter prior to obtaining the urine culture  if it has been in place for greater than or equal to 14  days:->N/A No Foley  Indications for U/A Reflex to Micro - Reflex to  Culture:->Suprapubic Pain/Tenderness or Dysuria    PT/APTT [[id number]]  (Abnormal) Collected:  07/04/18 0011     Updated:  07/04/18 0044     PT 14.7 sec      PT INR 1.2     PTT 35 sec     Narrative:       Replace urinary catheter prior to obtaining the urine culture  if it has been in place for greater than or equal to 14  days:->N/A No Foley  Indications for U/A Reflex to Micro - Reflex to  Culture:->Suprapubic Pain/Tenderness or Dysuria    Troponin I [[id number]] Collected:  07/04/18 0011    Specimen:  Blood Updated:  07/04/18 0044     Troponin I 0.02 ng/mL     Narrative:       Replace urinary catheter prior to obtaining the urine culture  if it has been in place for greater than or  equal to 14  days:->N/A No Foley  Indications for U/A Reflex to Micro - Reflex to  Culture:->Suprapubic Pain/Tenderness or Dysuria    CBC and differential [[id number]] Collected:  07/04/18 0011    Specimen:  Blood Updated:  07/04/18 0039     WBC 7.41 x10 3/uL      Hgb 12.9 g/dL      Hematocrit 08.6 %      Platelets 171 x10 3/uL      RBC 4.08 x10 6/uL      MCV 94.4 fL      MCH 31.6 pg      MCHC 33.5 g/dL      RDW 13 %      MPV 11.1 fL      Neutrophils 60.8 %      Lymphocytes Automated 26.3 %      Monocytes 11.2 %      Eosinophils Automated 1.1 %      Basophils Automated 0.3 %      Immature Granulocyte 0.3 %      Nucleated RBC 0.0 /100 WBC      Neutrophils Absolute 4.51 x10 3/uL      Abs Lymph Automated 1.95 x10 3/uL      Abs Mono Automated 0.83 x10 3/uL      Abs Eos Automated 0.08 x10 3/uL      Absolute Baso Automated 0.02 x10 3/uL      Absolute Immature Granulocyte 0.02 x10 3/uL      Absolute NRBC 0.00 x10 3/uL     Narrative:       Replace urinary catheter prior to obtaining the urine culture  if it has been in place for greater than or equal to 14  days:->N/A No Foley  Indications for U/A Reflex to Micro - Reflex to  Culture:->Suprapubic Pain/Tenderness or Dysuria    Comprehensive metabolic panel [[id number]]  (Abnormal) Collected:  07/03/18 2339    Specimen:  Blood Updated:  07/04/18 0031     Glucose 113 mg/dL      BUN 52.8 mg/dL      Creatinine 1.0 mg/dL      Sodium 413 mEq/L      Potassium 4.2 mEq/L      Chloride  107 mEq/L      CO2 22 mEq/L      Calcium 8.9 mg/dL      Protein, Total 7.2 g/dL      Albumin 4.0 g/dL      AST (SGOT) 21 U/L      ALT 19 U/L      Alkaline Phosphatase 76 U/L      Bilirubin, Total <1.0 mg/dL      Globulin 3.2 g/dL      Albumin/Globulin Ratio 1.3     Anion Gap 11.0    Narrative:       Replace urinary catheter prior to obtaining the urine culture  if it has been in place for greater than or equal to 14  days:->N/A No Foley  Indications for U/A Reflex to Micro - Reflex to   Culture:->Suprapubic Pain/Tenderness or Dysuria    Lipase [[id number]] Collected:  07/03/18 2339    Specimen:  Blood Updated:  07/04/18 0031     Lipase 28 U/L     Narrative:       Replace urinary catheter prior to obtaining the urine culture  if it has been in place for greater than or equal to 14  days:->N/A No Foley  Indications for U/A Reflex to Micro - Reflex to  Culture:->Suprapubic Pain/Tenderness or Dysuria    Magnesium [[id number]]  (Abnormal) Collected:  07/03/18 2339    Specimen:  Blood Updated:  07/04/18 0031     Magnesium 1.4 mg/dL     Narrative:       Replace urinary catheter prior to obtaining the urine culture  if it has been in place for greater than or equal to 14  days:->N/A No Foley  Indications for U/A Reflex to Micro - Reflex to  Culture:->Suprapubic Pain/Tenderness or Dysuria    GFR [[id number]] Collected:  07/03/18 2339     Updated:  07/04/18 0031     EGFR 55.2    Narrative:       Replace urinary catheter prior to obtaining the urine culture  if it has been in place for greater than or equal to 14  days:->N/A No Foley  Indications for U/A Reflex to Micro - Reflex to  Culture:->Suprapubic Pain/Tenderness or Dysuria          Procedures performed:   Radiology: all results from this admission  Xr Chest  Ap Portable    Result Date: 07/03/2018  Suboptimal exam due to AP technique, body habitus and low lung volumes. Questionable opacities at the lung bases, either atelectasis or pneumonia. [name] Kindred, MD 07/03/2018 11:46 PM      Treatment Team:   Consulting Physician: Marian Sorrow, MD    Disposition:   Disposition: Home or Self Care    Condition at Discharge:   stable     Unresulted Labs     None          Discharge Instructions:     Follow-up Information     Pcp, [email or web address], MD .           Your Cardiologist Follow up in 1 week(s).               Discharge References/Attachments    Atrial Fibrillation, Discharge Instructions for (English)            Discharge Medication List       Taking    allopurinol 300 MG tablet  Dose:  300 mg  Commonly known as:  ZYLOPRIM  Take 300 mg by mouth  daily      amLODIPine 10 MG tablet  Dose:  10 mg  Commonly known as:  NORVASC  Take 10 mg by mouth daily.     aspirin EC 81 MG EC tablet  Dose:  81 mg  Take 81 mg by mouth daily     atorvastatin 80 MG tablet  Dose:  80 mg  Commonly known as:  LIPITOR  Take 80 mg by mouth daily.     chlorthalidone 25 MG tablet  Dose:  25 mg  Commonly known as:  HYGROTEN  Take 1 tablet (25 mg total) by mouth every other day     citalopram 40 MG tablet  Dose:  40 mg  Commonly known as:  CeleXA  Take 40 mg by mouth daily.     clopidogrel 75 mg tablet  Dose:  75 mg  Commonly known as:  PLAVIX  Take 75 mg by mouth nightly.      ferrous sulfate 325 (65 FE) MG tablet  Dose:  325 mg  Take 325 mg by mouth every morning with breakfast     isosorbide mononitrate 30 MG 24 hr tablet  Dose:  60 mg  What changed:  how much to take  Commonly known as:  IMDUR  Take 2 tablets (60 mg total) by mouth daily     levothyroxine 75 MCG tablet  Dose:  75 mcg  Commonly known as:  SYNTHROID  Take 75 mcg by mouth Once a day at 6:00am.     lisinopril 10 MG tablet  Dose:  10 mg  Commonly known as:  ZESTRIL  Take 10 mg by mouth daily     metoprolol succinate XL 25 MG 24 hr tablet  Dose:  25 mg  Commonly known as:  TOPROL-XL  Take 1 tablet (25 mg total) by mouth daily     montelukast 10 MG tablet  Dose:  10 mg  Commonly known as:  SINGULAIR  Take 10 mg by mouth nightly.     oxyCODONE 5 MG immediate release tablet  Dose:  5 mg  Commonly known as:  ROXICODONE  Take 1 tablet (5 mg total) by mouth every 4 (four) hours as needed for Pain.     pantoprazole 40 MG tablet  Dose:  40 mg  Commonly known as:  PROTONIX  Take 40 mg by mouth daily.     ProAir HFA 108 (90 Base) MCG/ACT inhaler  Dose:  2 puff  Generic drug:  albuterol  2 puffs every 6 (six) hours as needed.     ranolazine 500 MG 12 hr tablet  Dose:  500 mg  Commonly known as:  RANEXA  Take 1 tablet (500 mg total)  by mouth every 12 (twelve) hours History of allergy to red dye but tolerated Renexa in the hospital     Xarelto 10 MG Tabs  Dose:  20 mg  Generic drug:  rivaroxaban  Take 20 mg by mouth daily with dinner             Signed by: Judson Roch, MD

## 2019-12-19 ENCOUNTER — Inpatient Hospital Stay
Admission: RE | Admit: 2019-12-19 | Discharge: 2020-01-28 | Payer: Medicare Other | Source: Other Acute Inpatient Hospital | Attending: Internal Medicine | Admitting: Internal Medicine

## 2019-12-19 ENCOUNTER — Other Ambulatory Visit (HOSPITAL_COMMUNITY): Payer: Medicare Other

## 2019-12-19 DIAGNOSIS — Z4659 Encounter for fitting and adjustment of other gastrointestinal appliance and device: Secondary | ICD-10-CM

## 2019-12-19 DIAGNOSIS — I480 Paroxysmal atrial fibrillation: Secondary | ICD-10-CM | POA: Diagnosis present

## 2019-12-19 DIAGNOSIS — I509 Heart failure, unspecified: Secondary | ICD-10-CM

## 2019-12-19 DIAGNOSIS — Z0189 Encounter for other specified special examinations: Secondary | ICD-10-CM

## 2019-12-19 DIAGNOSIS — R0902 Hypoxemia: Secondary | ICD-10-CM

## 2019-12-19 DIAGNOSIS — J189 Pneumonia, unspecified organism: Secondary | ICD-10-CM

## 2019-12-19 DIAGNOSIS — U071 COVID-19: Secondary | ICD-10-CM | POA: Diagnosis present

## 2019-12-19 DIAGNOSIS — G4733 Obstructive sleep apnea (adult) (pediatric): Secondary | ICD-10-CM | POA: Diagnosis present

## 2019-12-19 DIAGNOSIS — J9621 Acute and chronic respiratory failure with hypoxia: Secondary | ICD-10-CM | POA: Diagnosis present

## 2019-12-19 DIAGNOSIS — I2699 Other pulmonary embolism without acute cor pulmonale: Secondary | ICD-10-CM | POA: Diagnosis present

## 2019-12-19 DIAGNOSIS — K567 Ileus, unspecified: Secondary | ICD-10-CM

## 2019-12-19 HISTORY — DX: COVID-19: U07.1

## 2019-12-19 HISTORY — DX: Acute and chronic respiratory failure with hypoxia: J96.21

## 2019-12-19 HISTORY — DX: Paroxysmal atrial fibrillation: I48.0

## 2019-12-19 HISTORY — DX: Obstructive sleep apnea (adult) (pediatric): G47.33

## 2019-12-19 HISTORY — DX: Other pulmonary embolism without acute cor pulmonale: I26.99

## 2019-12-19 LAB — BRAIN NATRIURETIC PEPTIDE: B Natriuretic Peptide: 248.5 pg/mL — ABNORMAL HIGH (ref 0.0–100.0)

## 2019-12-19 LAB — BLOOD GAS, ARTERIAL
Acid-Base Excess: 7.8 mmol/L — ABNORMAL HIGH (ref 0.0–2.0)
Bicarbonate: 32.4 mmol/L — ABNORMAL HIGH (ref 20.0–28.0)
FIO2: 50
O2 Saturation: 97.4 %
Patient temperature: 36.7
pCO2 arterial: 49.9 mmHg — ABNORMAL HIGH (ref 32.0–48.0)
pH, Arterial: 7.427 (ref 7.350–7.450)
pO2, Arterial: 94.7 mmHg (ref 83.0–108.0)

## 2019-12-19 LAB — T4, FREE: Free T4: 0.69 ng/dL (ref 0.61–1.12)

## 2019-12-19 LAB — TSH: TSH: 6.025 u[IU]/mL — ABNORMAL HIGH (ref 0.350–4.500)

## 2019-12-20 ENCOUNTER — Other Ambulatory Visit (HOSPITAL_COMMUNITY): Payer: Medicare Other

## 2019-12-20 DIAGNOSIS — U071 COVID-19: Secondary | ICD-10-CM | POA: Diagnosis not present

## 2019-12-20 DIAGNOSIS — G4733 Obstructive sleep apnea (adult) (pediatric): Secondary | ICD-10-CM | POA: Diagnosis not present

## 2019-12-20 DIAGNOSIS — J9621 Acute and chronic respiratory failure with hypoxia: Secondary | ICD-10-CM | POA: Diagnosis not present

## 2019-12-20 DIAGNOSIS — I509 Heart failure, unspecified: Secondary | ICD-10-CM

## 2019-12-20 LAB — CBC WITH DIFFERENTIAL/PLATELET
Abs Immature Granulocytes: 0.04 10*3/uL (ref 0.00–0.07)
Basophils Absolute: 0 10*3/uL (ref 0.0–0.1)
Basophils Relative: 1 %
Eosinophils Absolute: 0.3 10*3/uL (ref 0.0–0.5)
Eosinophils Relative: 6 %
HCT: 30.7 % — ABNORMAL LOW (ref 36.0–46.0)
Hemoglobin: 9.2 g/dL — ABNORMAL LOW (ref 12.0–15.0)
Immature Granulocytes: 1 %
Lymphocytes Relative: 16 %
Lymphs Abs: 0.7 10*3/uL (ref 0.7–4.0)
MCH: 29.8 pg (ref 26.0–34.0)
MCHC: 30 g/dL (ref 30.0–36.0)
MCV: 99.4 fL (ref 80.0–100.0)
Monocytes Absolute: 0.5 10*3/uL (ref 0.1–1.0)
Monocytes Relative: 11 %
Neutro Abs: 2.8 10*3/uL (ref 1.7–7.7)
Neutrophils Relative %: 65 %
Platelets: 273 10*3/uL (ref 150–400)
RBC: 3.09 MIL/uL — ABNORMAL LOW (ref 3.87–5.11)
RDW: 16 % — ABNORMAL HIGH (ref 11.5–15.5)
WBC: 4.2 10*3/uL (ref 4.0–10.5)
nRBC: 0 % (ref 0.0–0.2)

## 2019-12-20 LAB — COMPREHENSIVE METABOLIC PANEL
ALT: 37 U/L (ref 0–44)
AST: 48 U/L — ABNORMAL HIGH (ref 15–41)
Albumin: 2.1 g/dL — ABNORMAL LOW (ref 3.5–5.0)
Alkaline Phosphatase: 71 U/L (ref 38–126)
Anion gap: 12 (ref 5–15)
BUN: 15 mg/dL (ref 8–23)
CO2: 30 mmol/L (ref 22–32)
Calcium: 8.7 mg/dL — ABNORMAL LOW (ref 8.9–10.3)
Chloride: 102 mmol/L (ref 98–111)
Creatinine, Ser: 0.75 mg/dL (ref 0.44–1.00)
GFR, Estimated: >60 mL/min (ref >60)
Glucose, Bld: 91 mg/dL (ref 70–99)
Potassium: 3.1 mmol/L — ABNORMAL LOW (ref 3.5–5.1)
Sodium: 144 mmol/L (ref 135–145)
Total Bilirubin: 0.9 mg/dL (ref 0.3–1.2)
Total Protein: 5.8 g/dL — ABNORMAL LOW (ref 6.5–8.1)

## 2019-12-20 LAB — BLOOD GAS, ARTERIAL
Acid-Base Excess: 7.4 mmol/L — ABNORMAL HIGH (ref 0.0–2.0)
Acid-Base Excess: 7.9 mmol/L — ABNORMAL HIGH (ref 0.0–2.0)
Acid-Base Excess: 8.6 mmol/L — ABNORMAL HIGH (ref 0.0–2.0)
Bicarbonate: 31.3 mmol/L — ABNORMAL HIGH (ref 20.0–28.0)
Bicarbonate: 31.7 mmol/L — ABNORMAL HIGH (ref 20.0–28.0)
Bicarbonate: 33 mmol/L — ABNORMAL HIGH (ref 20.0–28.0)
FIO2: 30
FIO2: 40
FIO2: 50
O2 Saturation: 90.2 %
O2 Saturation: 96.6 %
O2 Saturation: 98 %
Patient temperature: 37
Patient temperature: 37
Patient temperature: 37.2
pCO2 arterial: 42.9 mmHg (ref 32.0–48.0)
pCO2 arterial: 43.8 mmHg (ref 32.0–48.0)
pCO2 arterial: 49.4 mmHg — ABNORMAL HIGH (ref 32.0–48.0)
pH, Arterial: 7.44 (ref 7.350–7.450)
pH, Arterial: 7.469 — ABNORMAL HIGH (ref 7.350–7.450)
pH, Arterial: 7.482 — ABNORMAL HIGH (ref 7.350–7.450)
pO2, Arterial: 59.7 mmHg — ABNORMAL LOW (ref 83.0–108.0)
pO2, Arterial: 83.1 mmHg (ref 83.0–108.0)
pO2, Arterial: 99.2 mmHg (ref 83.0–108.0)

## 2019-12-20 NOTE — Consult Note (Addendum)
Pulmonary Critical Care Medicine [facility] [place]  PULMONARY SERVICE  Date of Service: 12/20/2019  PULMONARY CRITICAL CARE CONSULT   Laurie Chavez  WUJ:[id number]  DOB: 02-Feb-1951   DOA: 12/19/2019  Referring Physician: Carron Curie, MD  HPI: Laurie Chavez is a 69 y.o. female seen for follow up of Acute on Chronic Respiratory Failure.  Patient has multiple medical problems including coronary artery disease atrial fibrillation COVID-19 respiratory failure morbid obesity obstructive sleep apnea came into the hospital back in August for Covid pneumonia has been hospitalized for about 10 days came back into the hospital in September with a renal stone patient is having nausea vomiting hospital course was complicated with increased oxygen demand.  Had a prolonged admission to the hospital and has had difficulty weaning the FiO2 down since admission.  Review of Systems:  ROS performed and is unremarkable other than noted above.  Past medical history: Atrial fibrillation Chronic respiratory failure COVID-19 Morbid obesity Obstructive sleep apnea  pulmonary embolism Hypertension Hypothyroid Hyperlipidemia  Past surgical history: Abdominal surgery Back surgery Heart surgery Knee replacement Hernia repair Hysterectomy  Social history: Never smoker No alcohol or drug abuse  Family history: Noncontributory to the present illness  Medications: Reviewed on Rounds  Physical Exam:  Vitals: Temperature is 97.5 pulse seventy-two respiratory rate is sixteen blood pressure is 136/68 saturations 99%  Ventilator Settings currently on continuous BiPAP FiO2 40% pressure 20/10  . General: Comfortable at this time . Eyes: Grossly normal lids, irises & conjunctiva . ENT: grossly tongue is normal . Neck: no obvious mass . Cardiovascular: S1-S2 normal no gallop or rub . Respiratory: No rhonchi rales are noted at this time . Abdomen: Soft and nontender . Skin: no rash seen on  limited exam . Musculoskeletal: not rigid . Psychiatric:unable to assess . Neurologic: no seizure no involuntary movements         Labs on Admission:  Basic Metabolic Panel: Recent Labs  Lab 12/20/19 0438  NA 144  K 3.1*  CL 102  CO2 30  GLUCOSE 91  BUN 15  CREATININE 0.75  CALCIUM 8.7*    Recent Labs  Lab 12/19/19 1836 12/19/19 2349 12/20/19 0758  PHART 7.427 7.440 7.482*  PCO2ART 49.9* 49.4* 42.9  PO2ART 94.7 59.7* 99.2  HCO3 32.4* 33.0* 31.7*  O2SAT 97.4 90.2 98.0    Liver Function Tests: Recent Labs  Lab 12/20/19 0438  AST 48*  ALT 37  ALKPHOS 71  BILITOT 0.9  PROT 5.8*  ALBUMIN 2.1*   No results for input(s): LIPASE, AMYLASE in the last 168 hours. No results for input(s): AMMONIA in the last 168 hours.  CBC: Recent Labs  Lab 12/20/19 0438  WBC 4.2  NEUTROABS 2.8  HGB 9.2*  HCT 30.7*  MCV 99.4  PLT 273    Cardiac Enzymes: No results for input(s): CKTOTAL, CKMB, CKMBINDEX, TROPONINI in the last 168 hours.  BNP (last 3 results) Recent Labs    12/19/19 1850  BNP 248.5*    ProBNP (last 3 results) No results for input(s): PROBNP in the last 8760 hours.   Radiological Exams on Admission: DG CHEST PORT 1 VIEW  Result Date: 12/19/2019 CLINICAL DATA:  69 year old female with respiratory failure. EXAM: PORTABLE CHEST 1 VIEW COMPARISON:  None. FINDINGS: Portable AP semi upright view at 1750 hours. Right upper extremity approach PICC line in place, tip at the SVC level just above the carina. Mediastinal contours within normal limits. Visualized tracheal air column is within normal limits. Allowing for portable  technique the lungs are clear aside from linear opacity along the right minor fissure most resembling atelectasis, and more indistinct opacity at the left lung base. No pneumothorax. Paucity bowel gas in the upper abdomen. Cholecystectomy clips. Degenerative changes in the spine. IMPRESSION: 1. Right upper extremity approach PICC in place. 2.  Opacity along the right minor fissure and at the left lung base most suggestive of atelectasis. Electronically Signed   By: Odessa Fleming M.D.   On: 12/19/2019 18:37    Assessment/Plan Active Problems:   Acute on chronic respiratory failure with hypoxia (HCC)   Paroxysmal atrial fibrillation (HCC)   Obstructive sleep apnea   Pulmonary embolism and infarction (HCC)   COVID-19 virus infection   1. Acute on chronic respiratory failure with hypoxia patient has been on continuous BiPAP have asked for respiratory therapy check an ABG if the ABG looks okay which he did as noted above we will try for high flow oxygen to see how the patient is able to do. 2. Paroxysmal atrial fibrillation rate is controlled 3. We will continue to monitor 4. Obstructive sleep apnea no change 5. Pulmonary embolism treated we will continue to follow 6. COVID-19 virus infection in recovery  I have personally seen and evaluated the patient, evaluated laboratory and imaging results, formulated the assessment and plan and placed orders. The Patient requires high complexity decision making with multiple systems involvement.  Case was discussed on Rounds with the Respiratory Therapy Director and the Respiratory staff Time Spent  Yevonne Pax, MD [facility] Pulmonary Critical Care Medicine Sleep Medicine

## 2019-12-20 NOTE — Consult Note (Signed)
Infectious Disease Consultation   Laurie Chavez  JQZ:[id number]  DOB: March 19, 1950  DOA: 12/19/2019  Requesting physician: Dr. Manson Passey  Reason for consultation: Antibiotic recommendations   History of Present Illness: Laurie Chavez is an 69 y.o. female with medical history significant for coronary disease status post PCI x12 last 1 12/2018, paroxysmal atrial fibrillation on Xarelto, chronic hypoxemic respiratory failure, morbid obesity, obstructive sleep apnea, history of pulmonary embolism who was initially hospitalized September 23, 2019 for 10 days for Covid pneumonia and again on 10/10/2019 for atrial fibrillation and seen in the ED on 10/18/2019 with kidney stone.  After that she presented again to the emergency room at [facility] in [facility] with complaints of chest pain, nausea, vomiting.  She is on anticoagulation with Xarelto.  Patient was also found to be hypotensive and septic shock.  She had MRSA bacteremia and was treated with antibiotics.  However, patient continued to have persistent MRSA bacteremia therefore she had to be treated with IV daptomycin and ceftaroline also had to be added. Her hospital course was complicated.  She developed rapid atrial fibrillation and underwent cardioversion.  She also had worsening respiratory failure and had to be transferred to the ICU where she was intubated for hypoxemia/hypercapnia.  She apparently became bradycardic after intubation and went into cardiopulmonary arrest.  ACLS protocol was followed and she had return of spontaneous circulation after one round of CPR.  She was intubated for a few days and was extubated on 12/16/2019.  After that she was on BiPAP.  She remained encephalopathic after the episode.  For her MRSA bacteremia infectious disease was consulted and as mentioned above she was treated with daptomycin but ceftaroline added due to persistent bacteremia. TEE was negative for valve vegetations. She has knee  hardware. Per records from outside facility did not receive Rifampin. She had CT done on 11/26/2019 which showed findings concerning for T7-T8, T6-T7 discitis/osteomyelitis.  Her last blood cultures which were negative was on 12/06/2019. Currently she still remains encephalopathic.  On BiPAP.   Review of Systems:  She is lethargic, minimally verbal, not following any commands.  Unable to obtain review of systems from the patient.  Past Medical History: Hypothyroidism, chronic back pain, coronary artery disease status post stent placement, chronic hypoxemic respiratory failure, history of COVID-19 infection, GERD, gout, history of MI, history of pulmonary embolism, hypertension, hyperlipidemia, hypomagnesemia, iron deficiency anemia, MRSA infection, morbid obesity with BMI 40-40 4.9, obstructive sleep apnea, paroxysmal atrial fibrillation.   Past Surgical History: History of cardiac cath with stent placement  Allergies: Iodine containing compounds, tramadol  Social History:  has no history on file for tobacco use, alcohol use, and drug use.  Family History: Cardiac arrest Sister, metastatic cancer Brother, stroke mother  Physical Exam: Vitals: Temperature 98.2, pulse 96, respiratory rate 16, blood pressure 129/60, pulse oximetry 96% on BiPAP Constitutional: Morbidly obese patient, lethargic, oriented x1  Head: Atraumatic, normocephalic Eyes: PERLA  ENMT: external ears normal, she has pressure injury on her nose, Lips appears normal, moist oral mucosa  Neck: Obese, no masses CVS: S1-S2  Respiratory: Decreased breath sounds, occasional rhonchi, no wheezing Abdomen: Morbidly obese, positive bowel sounds Musculoskeletal: Edema Neuro: Lethargic, not following commands.  Unable to do neurologic exam at this time.   Psych: Unable to assess Skin: Multiple pressure injuries, sacrococcygeal unstageable pressure injury  Data reviewed:  I have personally reviewed following labs and imaging  studies Labs:  CBC: Recent  Labs  Lab 12/20/19 0438  WBC 4.2  NEUTROABS 2.8  HGB 9.2*  HCT 30.7*  MCV 99.4  PLT 273    Basic Metabolic Panel: Recent Labs  Lab 12/20/19 0438  NA 144  K 3.1*  CL 102  CO2 30  GLUCOSE 91  BUN 15  CREATININE 0.75  CALCIUM 8.7*   GFR CrCl cannot be calculated (Unknown ideal weight.). Liver Function Tests: Recent Labs  Lab 12/20/19 0438  AST 48*  ALT 37  ALKPHOS 71  BILITOT 0.9  PROT 5.8*  ALBUMIN 2.1*   No results for input(s): LIPASE, AMYLASE in the last 168 hours. No results for input(s): AMMONIA in the last 168 hours. Coagulation profile No results for input(s): INR, PROTIME in the last 168 hours.  Cardiac Enzymes: No results for input(s): CKTOTAL, CKMB, CKMBINDEX, TROPONINI in the last 168 hours. BNP: Invalid input(s): POCBNP CBG: No results for input(s): GLUCAP in the last 168 hours. D-Dimer No results for input(s): DDIMER in the last 72 hours. Hgb A1c No results for input(s): HGBA1C in the last 72 hours. Lipid Profile No results for input(s): CHOL, HDL, LDLCALC, TRIG, CHOLHDL, LDLDIRECT in the last 72 hours. Thyroid function studies Recent Labs    12/19/19 1850  TSH 6.025*   Anemia work up No results for input(s): VITAMINB12, FOLATE, FERRITIN, TIBC, IRON, RETICCTPCT in the last 72 hours. Urinalysis No results found for: COLORURINE, APPEARANCEUR, LABSPEC, PHURINE, GLUCOSEU, HGBUR, BILIRUBINUR, KETONESUR, PROTEINUR, UROBILINOGEN, NITRITE, LEUKOCYTESUR   Microbiology Recent Results (from the past 240 hour(s))  Culture, respiratory (non-expectorated)     Status: None (Preliminary result)   Collection Time: 12/19/19  5:36 PM   Specimen: Tracheal Aspirate; Respiratory  Result Value Ref Range Status   Specimen Description TRACHEAL ASPIRATE  Final   Special Requests NONE  Final   Gram Stain   Final    RARE WBC PRESENT, PREDOMINANTLY PMN FEW SQUAMOUS EPITHELIAL CELLS PRESENT NO ORGANISMS SEEN    Culture   Final     TOO YOUNG TO READ Performed at [facility], [address] [place], [place] [address]    Report Status PENDING  Incomplete    Inpatient Medications:   Please see MAR   Radiological Exams on Admission: DG Abd 1 View  Result Date: 12/20/2019 CLINICAL DATA:  Check gastric catheter EXAM: ABDOMEN - 1 VIEW COMPARISON:  Film from earlier in the same day. FINDINGS: Gastric catheter is noted with the tip extending into the proximal pylorus. Scattered large and small bowel gas is noted. No other focal abnormality is noted. IMPRESSION: Gastric catheter as described. Electronically Signed   By: Alcide Clever M.D.   On: 12/20/2019 15:29   DG CHEST PORT 1 VIEW  Result Date: 12/19/2019 CLINICAL DATA:  69 year old female with respiratory failure. EXAM: PORTABLE CHEST 1 VIEW COMPARISON:  None. FINDINGS: Portable AP semi upright view at 1750 hours. Right upper extremity approach PICC line in place, tip at the SVC level just above the carina. Mediastinal contours within normal limits. Visualized tracheal air column is within normal limits. Allowing for portable technique the lungs are clear aside from linear opacity along the right minor fissure most resembling atelectasis, and more indistinct opacity at the left lung base. No pneumothorax. Paucity bowel gas in the upper abdomen. Cholecystectomy clips. Degenerative changes in the spine. IMPRESSION: 1. Right upper extremity approach PICC in place. 2. Opacity along the right minor fissure and at the left lung base most suggestive of atelectasis. Electronically Signed  By: Odessa Fleming M.D.   On: 12/19/2019 18:37   DG Abd Portable 1V  Result Date: 12/20/2019 CLINICAL DATA:  69 year old female new enteric tube. EXAM: PORTABLE ABDOMEN - 1 VIEW COMPARISON:  Portable chest 12/19/2019. FINDINGS: Portable AP supine view at 1409 hours. Enteric tube placed into the stomach. Tip is at the level of the gastric body. Stable cholecystectomy clips. Non obstructed bowel  gas pattern. Right lung base opacity appears increased. Persistent left lung base opacity. No acute osseous abnormality identified. IMPRESSION: 1. Enteric tube placed into the stomach, tip at the level of the gastric body. 2. Increased lung base opacity. Consider atelectasis, infection, aspiration. Electronically Signed   By: Odessa Fleming M.D.   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  She had recent COVID-19 infection in recovery, she also has history of pulmonary embolism on anticoagulation, chronic hypoventilation secondary to probable obesity hypoventilation syndrome from morbid obesity, obstructive sleep apnea.  She was intubated at the outside facility and was treated with broad-spectrum antimicrobials.  Currently extubated on BiPAP.  Pulmonary following.  At this time she is on treatment with daptomycin, ceftaroline for MRSA pneumonia.  She is also very high risk for pneumonia.  If her respiratory status is worsening consider obtaining respiratory cultures if feasible and repeat chest imaging preferably chest CT to better evaluate.  MRSA bacteremia: Patient had MRSA bacteremia and had sepsis and shock at the outside facility.  Currently shock is resolved.  Her last blood cultures that were negative at the outside facility where 12/06/2019.  She is currently on treatment with dual antibiotic with daptomycin and ceftaroline.  This was because she had persistent bacteremia with MRSA all through September 2021 and was not clearing the bacteremia until ceftaroline was added to the daptomycin. She had  transesophageal echocardiogram on 10/31/2019 at the outside facility which did not show any evidence of vegetations. Would recommend to check repeat blood cultures.  If the repeat blood cultures remain negative then we probably could switch to monotherapy and treat with daptomycin.  Please monitor CBC, CMP, CK while on the daptomycin.  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  Unfortunately she has right knee hardware therefore was unable to get an MRI.  She also had persistent bacteremia and required dual antibiotic therapy for to finally clear her MRSA bacteremia.  Her last blood cultures that were negative was 12/06/2019.  Therefore given the osteomyelitis she would need treatment for 6 weeks from the date of last negative blood cultures which would be tentatively 01/17/2020.  She will also need a repeat CT scan towards the end of her therapy to document resolution of the osteomyelitis.  Sepsis with shock, currently shock resolved: As mentioned above she is high risk for recurrent sepsis.  If she starts having worsening fevers or leukocytosis would recommend to send for pan cultures.  If she has diarrhea suggest to check stool for C. Difficile.  Morbid obesity/obstructive sleep apnea: Continue supportive management per the primary team.  Paroxysmal atrial fibrillation: Continue medication and management per the primary team.  History of pulmonary embolism: She is on chronic anticoagulation with Xarelto.  Recent COVID-19 infection, in recovery: Continue supportive management per the primary team.  Due to her complex medical problems she is very high risk for worsening and decompensation. Thank you for this consultation.  Plan of care discussed with the primary team and pharmacy.  Vonzella NippleAnupama Alwaleed Obeso M.D. 12/20/2019, 4:28 PM

## 2019-12-21 ENCOUNTER — Other Ambulatory Visit (HOSPITAL_COMMUNITY): Payer: Medicare Other

## 2019-12-21 ENCOUNTER — Encounter: Payer: Self-pay | Admitting: Internal Medicine

## 2019-12-21 DIAGNOSIS — G4733 Obstructive sleep apnea (adult) (pediatric): Secondary | ICD-10-CM

## 2019-12-21 DIAGNOSIS — J9621 Acute and chronic respiratory failure with hypoxia: Secondary | ICD-10-CM | POA: Diagnosis present

## 2019-12-21 DIAGNOSIS — I2699 Other pulmonary embolism without acute cor pulmonale: Secondary | ICD-10-CM | POA: Diagnosis present

## 2019-12-21 DIAGNOSIS — U071 COVID-19: Secondary | ICD-10-CM | POA: Diagnosis not present

## 2019-12-21 DIAGNOSIS — I480 Paroxysmal atrial fibrillation: Secondary | ICD-10-CM | POA: Diagnosis present

## 2019-12-21 LAB — POTASSIUM: Potassium: 3.2 mmol/L — ABNORMAL LOW (ref 3.5–5.1)

## 2019-12-21 NOTE — Progress Notes (Signed)
Pulmonary Critical Care Medicine [facility] [place]   PULMONARY CRITICAL CARE SERVICE  PROGRESS NOTE  Date of Service: 12/21/2019  Laurie Chavez  [id number]:[id number]  DOB: March 20, 1950   DOA: 12/19/2019  Referring Physician: Carron Curie, MD  HPI: Laurie Chavez is a 70 y.o. female seen for follow up of Acute on Chronic Respiratory Failure.  She is doing better this morning we switched her over to high flow oxygen she is now on 25 L and FiO2 of 60%.  Medications: Reviewed on Rounds  Physical Exam:  Vitals: Temperature is 98.6 pulse 66 respiratory 21 blood pressure is 112/52 saturations 98%  Ventilator Settings on high flow oxygen 25 L FiO2 60%  . General: Comfortable at this time . Eyes: Grossly normal lids, irises & conjunctiva . ENT: grossly tongue is normal . Neck: no obvious mass . Cardiovascular: S1 S2 normal no gallop . Respiratory: Scattered rhonchi very coarse breath sounds . Abdomen: soft . Skin: no rash seen on limited exam . Musculoskeletal: not rigid . Psychiatric:unable to assess . Neurologic: no seizure no involuntary movements         Lab Data:   Basic Metabolic Panel: Recent Labs  Lab 12/20/19 0438  NA 144  K 3.1*  CL 102  CO2 30  GLUCOSE 91  BUN 15  CREATININE 0.75  CALCIUM 8.7*    ABG: Recent Labs  Lab 12/19/19 1836 12/19/19 2349 12/20/19 0758 12/20/19 1303  PHART 7.427 7.440 7.482* 7.469*  PCO2ART 49.9* 49.4* 42.9 43.8  PO2ART 94.7 59.7* 99.2 83.1  HCO3 32.4* 33.0* 31.7* 31.3*  O2SAT 97.4 90.2 98.0 96.6    Liver Function Tests: Recent Labs  Lab 12/20/19 0438  AST 48*  ALT 37  ALKPHOS 71  BILITOT 0.9  PROT 5.8*  ALBUMIN 2.1*   No results for input(s): LIPASE, AMYLASE in the last 168 hours. No results for input(s): AMMONIA in the last 168 hours.  CBC: Recent Labs  Lab 12/20/19 0438  WBC 4.2  NEUTROABS 2.8  HGB 9.2*  HCT 30.7*  MCV 99.4  PLT 273    Cardiac Enzymes: No results for input(s): CKTOTAL, CKMB,  CKMBINDEX, TROPONINI in the last 168 hours.  BNP (last 3 results) Recent Labs    12/19/19 1850  BNP 248.5*    ProBNP (last 3 results) No results for input(s): PROBNP in the last 8760 hours.  Radiological Exams: DG Abd 1 View  Result Date: 12/20/2019 CLINICAL DATA:  Check gastric catheter EXAM: ABDOMEN - 1 VIEW COMPARISON:  Film from earlier in the same day. FINDINGS: Gastric catheter is noted with the tip extending into the proximal pylorus. Scattered large and small bowel gas is noted. No other focal abnormality is noted. IMPRESSION: Gastric catheter as described. Electronically Signed   By: Alcide Clever M.D.   On: 12/20/2019 15:29   DG CHEST PORT 1 VIEW  Result Date: 12/19/2019 CLINICAL DATA:  69 year old female with respiratory failure. EXAM: PORTABLE CHEST 1 VIEW COMPARISON:  None. FINDINGS: Portable AP semi upright view at 1750 hours. Right upper extremity approach PICC line in place, tip at the SVC level just above the carina. Mediastinal contours within normal limits. Visualized tracheal air column is within normal limits. Allowing for portable technique the lungs are clear aside from linear opacity along the right minor fissure most resembling atelectasis, and more indistinct opacity at the left lung base. No pneumothorax. Paucity bowel gas in the upper abdomen. Cholecystectomy clips. Degenerative changes in the spine. IMPRESSION: 1. Right upper extremity approach  PICC in place. 2. Opacity along the right minor fissure and at the left lung base most suggestive of atelectasis. Electronically Signed   By: Odessa Fleming M.D.   On: 12/19/2019 18:37   DG Abd Portable 1V  Result Date: 12/20/2019 CLINICAL DATA:  69 year old female new enteric tube. EXAM: PORTABLE ABDOMEN - 1 VIEW COMPARISON:  Portable chest 12/19/2019. FINDINGS: Portable AP supine view at 1409 hours. Enteric tube placed into the stomach. Tip is at the level of the gastric body. Stable cholecystectomy clips. Non obstructed bowel  gas pattern. Right lung base opacity appears increased. Persistent left lung base opacity. No acute osseous abnormality identified. IMPRESSION: 1. Enteric tube placed into the stomach, tip at the level of the gastric body. 2. Increased lung base opacity. Consider atelectasis, infection, aspiration. Electronically Signed   By: Odessa Fleming M.D.   On: 12/20/2019 14:58    Assessment/Plan Active Problems:   Acute on chronic respiratory failure with hypoxia (HCC)   Paroxysmal atrial fibrillation (HCC)   Obstructive sleep apnea   Pulmonary embolism and infarction (HCC)   COVID-19 virus infection   1. Acute on chronic respiratory failure with hypoxia plan continue with try to wean the FiO2 down now is on 25 L with an FiO2 of 60%. 2. Paroxysmal atrial fibrillation rate is controlled we will continue supportive care. 3. Obstructive sleep apnea she may need positive airway pressure support 4. Pulmonary embolism treated 5. COVID-19 virus infection remote in the past we will continue to monitor.  The most recent chest x-ray was showing some issues with atelectasis.   I have personally seen and evaluated the patient, evaluated laboratory and imaging results, formulated the assessment and plan and placed orders. The Patient requires high complexity decision making with multiple systems involvement.  Rounds were done with the Respiratory Therapy Director and Staff therapists and discussed with nursing staff also.  Yevonne Pax, MD [facility] Pulmonary Critical Care Medicine Sleep Medicine

## 2019-12-22 DIAGNOSIS — G4733 Obstructive sleep apnea (adult) (pediatric): Secondary | ICD-10-CM | POA: Diagnosis not present

## 2019-12-22 DIAGNOSIS — J9621 Acute and chronic respiratory failure with hypoxia: Secondary | ICD-10-CM | POA: Diagnosis not present

## 2019-12-22 DIAGNOSIS — U071 COVID-19: Secondary | ICD-10-CM | POA: Diagnosis not present

## 2019-12-22 DIAGNOSIS — I480 Paroxysmal atrial fibrillation: Secondary | ICD-10-CM | POA: Diagnosis not present

## 2019-12-22 LAB — CBC
HCT: 34.3 % — ABNORMAL LOW (ref 36.0–46.0)
Hemoglobin: 10.6 g/dL — ABNORMAL LOW (ref 12.0–15.0)
MCH: 30 pg (ref 26.0–34.0)
MCHC: 30.9 g/dL (ref 30.0–36.0)
MCV: 97.2 fL (ref 80.0–100.0)
Platelets: 270 10*3/uL (ref 150–400)
RBC: 3.53 MIL/uL — ABNORMAL LOW (ref 3.87–5.11)
RDW: 15.9 % — ABNORMAL HIGH (ref 11.5–15.5)
WBC: 6.9 10*3/uL (ref 4.0–10.5)
nRBC: 0 % (ref 0.0–0.2)

## 2019-12-22 LAB — BASIC METABOLIC PANEL
Anion gap: 10 (ref 5–15)
BUN: 24 mg/dL — ABNORMAL HIGH (ref 8–23)
CO2: 29 mmol/L (ref 22–32)
Calcium: 8.5 mg/dL — ABNORMAL LOW (ref 8.9–10.3)
Chloride: 100 mmol/L (ref 98–111)
Creatinine, Ser: 0.73 mg/dL (ref 0.44–1.00)
GFR, Estimated: >60 mL/min (ref >60)
Glucose, Bld: 118 mg/dL — ABNORMAL HIGH (ref 70–99)
Potassium: 3.2 mmol/L — ABNORMAL LOW (ref 3.5–5.1)
Sodium: 139 mmol/L (ref 135–145)

## 2019-12-22 LAB — CULTURE, RESPIRATORY W GRAM STAIN: Culture: NORMAL

## 2019-12-22 NOTE — Progress Notes (Signed)
Pulmonary Critical Care Medicine [facility] [place]   PULMONARY CRITICAL CARE SERVICE  PROGRESS NOTE  Date of Service: 12/22/2019  Laurie Chavez  NWG:[id number]  DOB: 05-06-1950   DOA: 12/19/2019  Referring Physician: Carron Curie, MD  HPI: Laurie Chavez is a 69 y.o. female seen for follow up of Acute on Chronic Respiratory Failure.  Patient is on 25 L oxygen has been decreased down to 60% FiO2  Medications: Reviewed on Rounds  Physical Exam:  Vitals: Temperature is 98.5 pulse 68 respiratory rate 20 blood pressure is 117/63 saturations 94%  Ventilator Settings on heated high flow 25 L  . General: Comfortable at this time . Eyes: Grossly normal lids, irises & conjunctiva . ENT: grossly tongue is normal . Neck: no obvious mass . Cardiovascular: S1 S2 normal no gallop . Respiratory: No rhonchi no rales noted at this time . Abdomen: soft . Skin: no rash seen on limited exam . Musculoskeletal: not rigid . Psychiatric:unable to assess . Neurologic: no seizure no involuntary movements         Lab Data:   Basic Metabolic Panel: Recent Labs  Lab 12/20/19 0438 12/21/19 1111 12/22/19 0716  NA 144  --  139  K 3.1* 3.2* 3.2*  CL 102  --  100  CO2 30  --  29  GLUCOSE 91  --  118*  BUN 15  --  24*  CREATININE 0.75  --  0.73  CALCIUM 8.7*  --  8.5*    ABG: Recent Labs  Lab 12/19/19 1836 12/19/19 2349 12/20/19 0758 12/20/19 1303  PHART 7.427 7.440 7.482* 7.469*  PCO2ART 49.9* 49.4* 42.9 43.8  PO2ART 94.7 59.7* 99.2 83.1  HCO3 32.4* 33.0* 31.7* 31.3*  O2SAT 97.4 90.2 98.0 96.6    Liver Function Tests: Recent Labs  Lab 12/20/19 0438  AST 48*  ALT 37  ALKPHOS 71  BILITOT 0.9  PROT 5.8*  ALBUMIN 2.1*   No results for input(s): LIPASE, AMYLASE in the last 168 hours. No results for input(s): AMMONIA in the last 168 hours.  CBC: Recent Labs  Lab 12/20/19 0438 12/22/19 0716  WBC 4.2 6.9  NEUTROABS 2.8  --   HGB 9.2* 10.6*  HCT 30.7* 34.3*   MCV 99.4 97.2  PLT 273 270    Cardiac Enzymes: No results for input(s): CKTOTAL, CKMB, CKMBINDEX, TROPONINI in the last 168 hours.  BNP (last 3 results) Recent Labs    12/19/19 1850  BNP 248.5*    ProBNP (last 3 results) No results for input(s): PROBNP in the last 8760 hours.  Radiological Exams: DG Abd 1 View  Result Date: 12/20/2019 CLINICAL DATA:  Check gastric catheter EXAM: ABDOMEN - 1 VIEW COMPARISON:  Film from earlier in the same day. FINDINGS: Gastric catheter is noted with the tip extending into the proximal pylorus. Scattered large and small bowel gas is noted. No other focal abnormality is noted. IMPRESSION: Gastric catheter as described. Electronically Signed   By: Alcide Clever M.D.   On: 12/20/2019 15:29   DG CHEST PORT 1 VIEW  Result Date: 12/21/2019 CLINICAL DATA:  Respiratory failure. EXAM: PORTABLE CHEST 1 VIEW COMPARISON:  12/19/2019 FINDINGS: The right PICC line is stable. New NG tube is coursing down the esophagus and into the stomach. Stable cardiac enlargement. Central vascular congestion and small pleural effusions with bibasilar atelectasis. No pneumothorax. IMPRESSION: New NG tube in good position. Stable cardiac enlargement, vascular congestion, pleural effusions and atelectasis. Electronically Signed   By: Orlene Plum.D.  On: 12/21/2019 11:44   DG Abd Portable 1V  Result Date: 12/20/2019 CLINICAL DATA:  70 year old female new enteric tube. EXAM: PORTABLE ABDOMEN - 1 VIEW COMPARISON:  Portable chest 12/19/2019. FINDINGS: Portable AP supine view at 1409 hours. Enteric tube placed into the stomach. Tip is at the level of the gastric body. Stable cholecystectomy clips. Non obstructed bowel gas pattern. Right lung base opacity appears increased. Persistent left lung base opacity. No acute osseous abnormality identified. IMPRESSION: 1. Enteric tube placed into the stomach, tip at the level of the gastric body. 2. Increased lung base opacity. Consider  atelectasis, infection, aspiration. Electronically Signed   By: Odessa Fleming M.D.   On: 12/20/2019 14:58    Assessment/Plan Active Problems:   Acute on chronic respiratory failure with hypoxia (HCC)   Paroxysmal atrial fibrillation (HCC)   Obstructive sleep apnea   Pulmonary embolism and infarction (HCC)   COVID-19 virus infection   1. Acute on chronic respiratory failure with hypoxia we will continue to wean the FiO2 down patient should be able to go down to 50% 2. Paroxysmal atrial fibrillation rate now rate is controlled we will continue supportive care 3. Obstructive sleep apnea no desats are noted 4. Pulmonary embolism has been on anticoagulation 5. COVID-19 virus infection in recovery   I have personally seen and evaluated the patient, evaluated laboratory and imaging results, formulated the assessment and plan and placed orders. The Patient requires high complexity decision making with multiple systems involvement.  Rounds were done with the Respiratory Therapy Director and Staff therapists and discussed with nursing staff also.  Yevonne Pax, MD [facility] Pulmonary Critical Care Medicine Sleep Medicine

## 2019-12-23 DIAGNOSIS — I480 Paroxysmal atrial fibrillation: Secondary | ICD-10-CM | POA: Diagnosis not present

## 2019-12-23 DIAGNOSIS — J9621 Acute and chronic respiratory failure with hypoxia: Secondary | ICD-10-CM | POA: Diagnosis not present

## 2019-12-23 DIAGNOSIS — G4733 Obstructive sleep apnea (adult) (pediatric): Secondary | ICD-10-CM | POA: Diagnosis not present

## 2019-12-23 DIAGNOSIS — U071 COVID-19: Secondary | ICD-10-CM | POA: Diagnosis not present

## 2019-12-23 LAB — POTASSIUM: Potassium: 4.4 mmol/L (ref 3.5–5.1)

## 2019-12-23 NOTE — Progress Notes (Signed)
Pulmonary Critical Care Medicine [facility] [place]   PULMONARY CRITICAL CARE SERVICE  PROGRESS NOTE  Date of Service: 12/23/2019  Laurie Chavez  EUM:[id number]  DOB: [birth year]   DOA: 12/19/2019  Referring Physician: Carron Curie, MD  HPI: Laurie Chavez is a 69 y.o. female seen for follow up of Acute on Chronic Respiratory Failure. Patient currently is on 20 L of oxygen with 40% FiO2  Medications: Reviewed on Rounds  Physical Exam:  Vitals: Temperature is 98.6 pulse 71 respiratory rate is 18 blood pressure is 141/68 saturations 96%  Ventilator Settings on high flow oxygen 20 L FiO2 is at 40% now  . General: Comfortable at this time . Eyes: Grossly normal lids, irises & conjunctiva . ENT: grossly tongue is normal . Neck: no obvious mass . Cardiovascular: S1 S2 normal no gallop . Respiratory: No rhonchi no rales noted at this time . Abdomen: soft . Skin: no rash seen on limited exam . Musculoskeletal: not rigid . Psychiatric:unable to assess . Neurologic: no seizure no involuntary movements         Lab Data:   Basic Metabolic Panel: Recent Labs  Lab 12/20/19 0438 12/21/19 1111 12/22/19 0716 12/23/19 0440  NA 144  --  139  --   K 3.1* 3.2* 3.2* 4.4  CL 102  --  100  --   CO2 30  --  29  --   GLUCOSE 91  --  118*  --   BUN 15  --  24*  --   CREATININE 0.75  --  0.73  --   CALCIUM 8.7*  --  8.5*  --     ABG: Recent Labs  Lab 12/19/19 1836 12/19/19 2349 12/20/19 0758 12/20/19 1303  PHART 7.427 7.440 7.482* 7.469*  PCO2ART 49.9* 49.4* 42.9 43.8  PO2ART 94.7 59.7* 99.2 83.1  HCO3 32.4* 33.0* 31.7* 31.3*  O2SAT 97.4 90.2 98.0 96.6    Liver Function Tests: Recent Labs  Lab 12/20/19 0438  AST 48*  ALT 37  ALKPHOS 71  BILITOT 0.9  PROT 5.8*  ALBUMIN 2.1*   No results for input(s): LIPASE, AMYLASE in the last 168 hours. No results for input(s): AMMONIA in the last 168 hours.  CBC: Recent Labs  Lab 12/20/19 0438 12/22/19 0716  WBC  4.2 6.9  NEUTROABS 2.8  --   HGB 9.2* 10.6*  HCT 30.7* 34.3*  MCV 99.4 97.2  PLT 273 270    Cardiac Enzymes: No results for input(s): CKTOTAL, CKMB, CKMBINDEX, TROPONINI in the last 168 hours.  BNP (last 3 results) Recent Labs    12/19/19 1850  BNP 248.5*    ProBNP (last 3 results) No results for input(s): PROBNP in the last 8760 hours.  Radiological Exams: DG CHEST PORT 1 VIEW  Result Date: 12/21/2019 CLINICAL DATA:  Respiratory failure. EXAM: PORTABLE CHEST 1 VIEW COMPARISON:  12/19/2019 FINDINGS: The right PICC line is stable. New NG tube is coursing down the esophagus and into the stomach. Stable cardiac enlargement. Central vascular congestion and small pleural effusions with bibasilar atelectasis. No pneumothorax. IMPRESSION: New NG tube in good position. Stable cardiac enlargement, vascular congestion, pleural effusions and atelectasis. Electronically Signed   By: Rudie Meyer M.D.   On: 12/21/2019 11:44    Assessment/Plan Active Problems:   Acute on chronic respiratory failure with hypoxia (HCC)   Paroxysmal atrial fibrillation (HCC)   Obstructive sleep apnea   Pulmonary embolism and infarction (HCC)   COVID-19 virus infection   1. Acute on  chronic respiratory failure hypoxia plan is to continue with the titrating FiO2 down now is on 40% is already mentioned 2. Paroxysmal atrial fibrillation rate is controlled we will continue to follow next  3. obstructive sleep apnea no change 4. Pulmonary embolism treated we will monitor 5. COVID-19 virus infection resolution   I have personally seen and evaluated the patient, evaluated laboratory and imaging results, formulated the assessment and plan and placed orders. The Patient requires high complexity decision making with multiple systems involvement.  Rounds were done with the Respiratory Therapy Director and Staff therapists and discussed with nursing staff also.  Yevonne Pax, MD [facility] Pulmonary Critical Care  Medicine Sleep Medicine

## 2019-12-24 DIAGNOSIS — U071 COVID-19: Secondary | ICD-10-CM | POA: Diagnosis not present

## 2019-12-24 DIAGNOSIS — I480 Paroxysmal atrial fibrillation: Secondary | ICD-10-CM | POA: Diagnosis not present

## 2019-12-24 DIAGNOSIS — G4733 Obstructive sleep apnea (adult) (pediatric): Secondary | ICD-10-CM | POA: Diagnosis not present

## 2019-12-24 DIAGNOSIS — J9621 Acute and chronic respiratory failure with hypoxia: Secondary | ICD-10-CM | POA: Diagnosis not present

## 2019-12-24 LAB — CBC
HCT: 35.5 % — ABNORMAL LOW (ref 36.0–46.0)
Hemoglobin: 10.9 g/dL — ABNORMAL LOW (ref 12.0–15.0)
MCH: 29.6 pg (ref 26.0–34.0)
MCHC: 30.7 g/dL (ref 30.0–36.0)
MCV: 96.5 fL (ref 80.0–100.0)
Platelets: 250 10*3/uL (ref 150–400)
RBC: 3.68 MIL/uL — ABNORMAL LOW (ref 3.87–5.11)
RDW: 15.9 % — ABNORMAL HIGH (ref 11.5–15.5)
WBC: 8.7 10*3/uL (ref 4.0–10.5)
nRBC: 0 % (ref 0.0–0.2)

## 2019-12-24 LAB — BASIC METABOLIC PANEL
Anion gap: 13 (ref 5–15)
BUN: 41 mg/dL — ABNORMAL HIGH (ref 8–23)
CO2: 25 mmol/L (ref 22–32)
Calcium: 8.8 mg/dL — ABNORMAL LOW (ref 8.9–10.3)
Chloride: 104 mmol/L (ref 98–111)
Creatinine, Ser: 0.8 mg/dL (ref 0.44–1.00)
GFR, Estimated: >60 mL/min (ref >60)
Glucose, Bld: 114 mg/dL — ABNORMAL HIGH (ref 70–99)
Potassium: 3.7 mmol/L (ref 3.5–5.1)
Sodium: 142 mmol/L (ref 135–145)

## 2019-12-24 NOTE — Progress Notes (Signed)
Pulmonary Critical Care Medicine [facility] [place]   PULMONARY CRITICAL CARE SERVICE  PROGRESS NOTE  Date of Service: 12/24/2019  Laurie Chavez  ASN:[id number]  DOB: Oct 31, 1950   DOA: 12/19/2019  Referring Physician: Carron Curie, MD  HPI: Laurie Chavez is a 69 y.o. female seen for follow up of Acute on Chronic Respiratory Failure.  Patient is on heated high flow on 20 L and requiring 35% FiO2 slowly has been weaning down  Medications: Reviewed on Rounds  Physical Exam:  Vitals: Temperature 97.7 pulse 72 respiratory rate is 19 blood pressure is 119/66 saturation was 95%  Ventilator Settings on heated high flow oxygen 20 L with an FiO2 35%  . General: Comfortable at this time . Eyes: Grossly normal lids, irises & conjunctiva . ENT: grossly tongue is normal . Neck: no obvious mass . Cardiovascular: S1 S2 normal no gallop . Respiratory: No rhonchi no rales . Abdomen: soft . Skin: no rash seen on limited exam . Musculoskeletal: not rigid . Psychiatric:unable to assess . Neurologic: no seizure no involuntary movements         Lab Data:   Basic Metabolic Panel: Recent Labs  Lab 12/20/19 0438 12/21/19 1111 12/22/19 0716 12/23/19 0440 12/24/19 0628  NA 144  --  139  --  142  K 3.1* 3.2* 3.2* 4.4 3.7  CL 102  --  100  --  104  CO2 30  --  29  --  25  GLUCOSE 91  --  118*  --  114*  BUN 15  --  24*  --  41*  CREATININE 0.75  --  0.73  --  0.80  CALCIUM 8.7*  --  8.5*  --  8.8*    ABG: Recent Labs  Lab 12/19/19 1836 12/19/19 2349 12/20/19 0758 12/20/19 1303  PHART 7.427 7.440 7.482* 7.469*  PCO2ART 49.9* 49.4* 42.9 43.8  PO2ART 94.7 59.7* 99.2 83.1  HCO3 32.4* 33.0* 31.7* 31.3*  O2SAT 97.4 90.2 98.0 96.6    Liver Function Tests: Recent Labs  Lab 12/20/19 0438  AST 48*  ALT 37  ALKPHOS 71  BILITOT 0.9  PROT 5.8*  ALBUMIN 2.1*   No results for input(s): LIPASE, AMYLASE in the last 168 hours. No results for input(s): AMMONIA in the last  168 hours.  CBC: Recent Labs  Lab 12/20/19 0438 12/22/19 0716 12/24/19 0628  WBC 4.2 6.9 8.7  NEUTROABS 2.8  --   --   HGB 9.2* 10.6* 10.9*  HCT 30.7* 34.3* 35.5*  MCV 99.4 97.2 96.5  PLT 273 270 250    Cardiac Enzymes: No results for input(s): CKTOTAL, CKMB, CKMBINDEX, TROPONINI in the last 168 hours.  BNP (last 3 results) Recent Labs    12/19/19 1850  BNP 248.5*    ProBNP (last 3 results) No results for input(s): PROBNP in the last 8760 hours.  Radiological Exams: No results found.  Assessment/Plan Active Problems:   Acute on chronic respiratory failure with hypoxia (HCC)   Paroxysmal atrial fibrillation (HCC)   Obstructive sleep apnea   Pulmonary embolism and infarction (HCC)   COVID-19 virus infection   1. Acute on chronic respiratory failure with hypoxia we will continue with on weaning FiO2 try Oxymizer 2. Paroxysmal atrial fibrillation rate is controlled 3. Obstructive sleep apnea no change 4. Pulmonary embolism treated 5. COVID-19 virus infection in recovery   I have personally seen and evaluated the patient, evaluated laboratory and imaging results, formulated the assessment and plan and placed orders. The  Patient requires high complexity decision making with multiple systems involvement.  Rounds were done with the Respiratory Therapy Director and Staff therapists and discussed with nursing staff also.  Allyne Gee, MD [facility] Pulmonary Critical Care Medicine Sleep Medicine

## 2019-12-25 DIAGNOSIS — I480 Paroxysmal atrial fibrillation: Secondary | ICD-10-CM | POA: Diagnosis not present

## 2019-12-25 DIAGNOSIS — U071 COVID-19: Secondary | ICD-10-CM | POA: Diagnosis not present

## 2019-12-25 DIAGNOSIS — G4733 Obstructive sleep apnea (adult) (pediatric): Secondary | ICD-10-CM | POA: Diagnosis not present

## 2019-12-25 DIAGNOSIS — J9621 Acute and chronic respiratory failure with hypoxia: Secondary | ICD-10-CM | POA: Diagnosis not present

## 2019-12-25 NOTE — Progress Notes (Signed)
Pulmonary Critical Care Medicine [facility] [place]   PULMONARY CRITICAL CARE SERVICE  PROGRESS NOTE  Date of Service: 12/25/2019  Laurie Chavez  FTD:[id number]  DOB: 1950-06-02   DOA: 12/19/2019  Referring Physician: Carron Curie, MD  HPI: Laurie Chavez is a 69 y.o. female seen for follow up of Acute on Chronic Respiratory Failure.  Patient right now is on 4 L of oxygen via Oxymizer  Medications: Reviewed on Rounds  Physical Exam:  Vitals: Temperature is 98.0 pulse 69 respiratory rate 20 blood pressure is 130/71 saturations 98%  Ventilator Settings 4 L Oxymizer  . General: Comfortable at this time . Eyes: Grossly normal lids, irises & conjunctiva . ENT: grossly tongue is normal . Neck: no obvious mass . Cardiovascular: S1 S2 normal no gallop . Respiratory: No rhonchi no rales are noted at this time . Abdomen: soft . Skin: no rash seen on limited exam . Musculoskeletal: not rigid . Psychiatric:unable to assess . Neurologic: no seizure no involuntary movements         Lab Data:   Basic Metabolic Panel: Recent Labs  Lab 12/20/19 0438 12/21/19 1111 12/22/19 0716 12/23/19 0440 12/24/19 0628  NA 144  --  139  --  142  K 3.1* 3.2* 3.2* 4.4 3.7  CL 102  --  100  --  104  CO2 30  --  29  --  25  GLUCOSE 91  --  118*  --  114*  BUN 15  --  24*  --  41*  CREATININE 0.75  --  0.73  --  0.80  CALCIUM 8.7*  --  8.5*  --  8.8*    ABG: Recent Labs  Lab 12/19/19 1836 12/19/19 2349 12/20/19 0758 12/20/19 1303  PHART 7.427 7.440 7.482* 7.469*  PCO2ART 49.9* 49.4* 42.9 43.8  PO2ART 94.7 59.7* 99.2 83.1  HCO3 32.4* 33.0* 31.7* 31.3*  O2SAT 97.4 90.2 98.0 96.6    Liver Function Tests: Recent Labs  Lab 12/20/19 0438  AST 48*  ALT 37  ALKPHOS 71  BILITOT 0.9  PROT 5.8*  ALBUMIN 2.1*   No results for input(s): LIPASE, AMYLASE in the last 168 hours. No results for input(s): AMMONIA in the last 168 hours.  CBC: Recent Labs  Lab 12/20/19 0438  12/22/19 0716 12/24/19 0628  WBC 4.2 6.9 8.7  NEUTROABS 2.8  --   --   HGB 9.2* 10.6* 10.9*  HCT 30.7* 34.3* 35.5*  MCV 99.4 97.2 96.5  PLT 273 270 250    Cardiac Enzymes: No results for input(s): CKTOTAL, CKMB, CKMBINDEX, TROPONINI in the last 168 hours.  BNP (last 3 results) Recent Labs    12/19/19 1850  BNP 248.5*    ProBNP (last 3 results) No results for input(s): PROBNP in the last 8760 hours.  Radiological Exams: No results found.  Assessment/Plan Active Problems:   Acute on chronic respiratory failure with hypoxia (HCC)   Paroxysmal atrial fibrillation (HCC)   Obstructive sleep apnea   Pulmonary embolism and infarction (HCC)   COVID-19 virus infection   1. Acute on chronic respiratory failure with hypoxia titrate oxygen as tolerated with secretion management supportive care. 2. Paroxysmal atrial fibrillation rate is controlled 3. Obstructive sleep apnea no change 4. Pulmonary embolism supportive care 5. COVID-19 virus infection in recovery   I have personally seen and evaluated the patient, evaluated laboratory and imaging results, formulated the assessment and plan and placed orders. The Patient requires high complexity decision making with multiple systems involvement.  Rounds were done with the Respiratory Therapy Director and Staff therapists and discussed with nursing staff also.  Allyne Gee, MD [facility] Pulmonary Critical Care Medicine Sleep Medicine

## 2019-12-26 LAB — CULTURE, BLOOD (ROUTINE X 2)
Culture: NO GROWTH
Culture: NO GROWTH

## 2019-12-27 ENCOUNTER — Other Ambulatory Visit (HOSPITAL_COMMUNITY): Payer: Medicare Other

## 2019-12-27 LAB — BASIC METABOLIC PANEL
Anion gap: 15 (ref 5–15)
BUN: 80 mg/dL — ABNORMAL HIGH (ref 8–23)
CO2: 26 mmol/L (ref 22–32)
Calcium: 9.4 mg/dL (ref 8.9–10.3)
Chloride: 110 mmol/L (ref 98–111)
Creatinine, Ser: 1.15 mg/dL — ABNORMAL HIGH (ref 0.44–1.00)
GFR, Estimated: 52 mL/min — ABNORMAL LOW (ref >60)
Glucose, Bld: 130 mg/dL — ABNORMAL HIGH (ref 70–99)
Potassium: 3 mmol/L — ABNORMAL LOW (ref 3.5–5.1)
Sodium: 151 mmol/L — ABNORMAL HIGH (ref 135–145)

## 2019-12-27 LAB — CBC
HCT: 36.7 % (ref 36.0–46.0)
Hemoglobin: 11.1 g/dL — ABNORMAL LOW (ref 12.0–15.0)
MCH: 30 pg (ref 26.0–34.0)
MCHC: 30.2 g/dL (ref 30.0–36.0)
MCV: 99.2 fL (ref 80.0–100.0)
Platelets: 256 10*3/uL (ref 150–400)
RBC: 3.7 MIL/uL — ABNORMAL LOW (ref 3.87–5.11)
RDW: 16.6 % — ABNORMAL HIGH (ref 11.5–15.5)
WBC: 11.1 10*3/uL — ABNORMAL HIGH (ref 4.0–10.5)
nRBC: 0 % (ref 0.0–0.2)

## 2019-12-27 LAB — CK: Total CK: 9 U/L — ABNORMAL LOW (ref 38–234)

## 2019-12-28 ENCOUNTER — Other Ambulatory Visit (HOSPITAL_COMMUNITY): Payer: Medicare Other

## 2019-12-28 LAB — URINALYSIS, ROUTINE W REFLEX MICROSCOPIC
Bilirubin Urine: NEGATIVE
Glucose, UA: NEGATIVE mg/dL
Ketones, ur: NEGATIVE mg/dL
Nitrite: NEGATIVE
Protein, ur: NEGATIVE mg/dL
RBC / HPF: >50 RBC/hpf — ABNORMAL HIGH (ref 0–5)
Specific Gravity, Urine: 1.018 (ref 1.005–1.030)
WBC, UA: >50 WBC/hpf — ABNORMAL HIGH (ref 0–5)
pH: 8 (ref 5.0–8.0)

## 2019-12-28 LAB — BASIC METABOLIC PANEL
Anion gap: 12 (ref 5–15)
BUN: 74 mg/dL — ABNORMAL HIGH (ref 8–23)
CO2: 25 mmol/L (ref 22–32)
Calcium: 9 mg/dL (ref 8.9–10.3)
Chloride: 112 mmol/L — ABNORMAL HIGH (ref 98–111)
Creatinine, Ser: 0.97 mg/dL (ref 0.44–1.00)
GFR, Estimated: >60 mL/min (ref >60)
Glucose, Bld: 155 mg/dL — ABNORMAL HIGH (ref 70–99)
Potassium: 3.1 mmol/L — ABNORMAL LOW (ref 3.5–5.1)
Sodium: 149 mmol/L — ABNORMAL HIGH (ref 135–145)

## 2019-12-30 LAB — CBC
HCT: 33.6 % — ABNORMAL LOW (ref 36.0–46.0)
Hemoglobin: 10.2 g/dL — ABNORMAL LOW (ref 12.0–15.0)
MCH: 30.4 pg (ref 26.0–34.0)
MCHC: 30.4 g/dL (ref 30.0–36.0)
MCV: 100 fL (ref 80.0–100.0)
Platelets: 213 10*3/uL (ref 150–400)
RBC: 3.36 MIL/uL — ABNORMAL LOW (ref 3.87–5.11)
RDW: 16.3 % — ABNORMAL HIGH (ref 11.5–15.5)
WBC: 8.3 10*3/uL (ref 4.0–10.5)
nRBC: 0 % (ref 0.0–0.2)

## 2019-12-30 LAB — CK: Total CK: 14 U/L — ABNORMAL LOW (ref 38–234)

## 2019-12-30 LAB — BASIC METABOLIC PANEL
Anion gap: 10 (ref 5–15)
BUN: 51 mg/dL — ABNORMAL HIGH (ref 8–23)
CO2: 18 mmol/L — ABNORMAL LOW (ref 22–32)
Calcium: 8.9 mg/dL (ref 8.9–10.3)
Chloride: 120 mmol/L — ABNORMAL HIGH (ref 98–111)
Creatinine, Ser: 0.72 mg/dL (ref 0.44–1.00)
GFR, Estimated: >60 mL/min (ref >60)
Glucose, Bld: 133 mg/dL — ABNORMAL HIGH (ref 70–99)
Potassium: 3.6 mmol/L (ref 3.5–5.1)
Sodium: 148 mmol/L — ABNORMAL HIGH (ref 135–145)

## 2019-12-31 LAB — URINE CULTURE: Culture: 70000 — AB

## 2019-12-31 LAB — CARBAPENEM RESISTANCE PANEL
Carba Resistance IMP Gene: NOT DETECTED
Carba Resistance KPC Gene: NOT DETECTED
Carba Resistance NDM Gene: NOT DETECTED
Carba Resistance OXA48 Gene: NOT DETECTED
Carba Resistance VIM Gene: NOT DETECTED

## 2019-12-31 NOTE — Progress Notes (Signed)
PROGRESS NOTE    Laurie Chavez  DTO:[id number] DOB: 12-Apr-1950 DOA: 12/19/2019   Brief Narrative:  Laurie Chavez is an 69 y.o. female with medical history significant for coronary disease status post PCI x12 last 1 12/2018, paroxysmal atrial fibrillation on Xarelto, chronic hypoxemic respiratory failure, morbid obesity, obstructive sleep apnea, history of pulmonary embolism who was initially hospitalized September 23, 2019 for 10 days for Covid pneumonia and again on 10/10/2019 for atrial fibrillation and seen in the ED on 10/18/2019 with kidney stone.  After that she presented again to the emergency room at [facility] in [facility] with complaints of chest pain, nausea, vomiting.  She is on anticoagulation with Xarelto.  Patient was also found to be hypotensive and septic shock.  She had MRSA bacteremia and was treated with antibiotics.  However, patient continued to have persistent MRSA bacteremia therefore she had to be treated with IV daptomycin and ceftaroline also had to be added. Her hospital course was complicated.  She developed rapid atrial fibrillation and underwent cardioversion.  She also had worsening respiratory failure and had to be transferred to the ICU where she was intubated for hypoxemia/hypercapnia.  She apparently became bradycardic after intubation and went into cardiopulmonary arrest.  ACLS protocol was followed and she had return of spontaneous circulation after one round of CPR.  She was intubated for a few days and was extubated on 12/16/2019.  After that she was on BiPAP.  She remained encephalopathic after the episode.  For her MRSA bacteremia infectious disease was consulted and as mentioned above she was treated with daptomycin but ceftaroline added due to persistent bacteremia. TEE was negative for valve vegetations. She has knee hardware. Per records from outside facility did not receive Rifampin. She had CT done on 11/26/2019 which showed findings  concerning for T7-T8, T6-T7 discitis/osteomyelitis.  Her last negative blood cultures at the outside facility was on 12/06/2019. She is encephalopathic.   12/27/2019: We tried to de-escalate her to monotherapy with daptomycin.  However, she started having low-grade fevers with worsening leukocytosis.  Also her BUN and creatinine worsening.  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  She had recent COVID-19 infection in recovery, she also has history of pulmonary embolism on anticoagulation, chronic hypoventilation secondary to probable obesity hypoventilation syndrome from morbid obesity, obstructive sleep apnea.  She was intubated at the outside facility and was treated with broad-spectrum antimicrobials.  She is extubated at this time, as needed BiPAP.  At this time she is on treatment with daptomycin, ceftaroline for MRSA bacteremia.  She is also very high risk for pneumonia.  If her respiratory status is worsening consider obtaining respiratory cultures if feasible and repeat chest imaging preferably chest CT to better evaluate.  MRSA bacteremia: Patient had MRSA bacteremia and had sepsis and shock at the outside facility.  Currently shock is resolved.  Her last blood cultures that were negative at the outside facility where 12/06/2019.  She is currently on treatment with dual antibiotic with daptomycin and ceftaroline.  This was because she had persistent bacteremia with MRSA all through September 2021 and was not clearing the  bacteremia until ceftaroline was added to the daptomycin. She had transesophageal echocardiogram  on 10/31/2019 at the outside facility which did not show any evidence of vegetations.   12/27/2019: We tried to switch to monotherapy with daptomycin.  However, she started having low-grade fever with worsening leukocytosis.  Therefore suggest to add back the ceftaroline.  Recommend to check repeat blood cultures and repeat chest imaging.   If chest x-ray shows any findings concerning for pneumonia suggest to add Levaquin for atypical coverage as well.  Daptomycin does not cover for pneumonia.  Please monitor CBC, CMP while on the antibiotics.  Monitor CK while on the daptomycin.  Fever/leukocytosis: As mentioned above, with right to switch to monotherapy with daptomycin.  However, she had fever and leukocytosis.  Therefore suggest to check repeat blood cultures and chest x-ray and add the ceftaroline back.  Plan as mentioned above.  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  Unfortunately she has right knee hardware therefore was unable to get an MRI.  She also had persistent bacteremia and required dual antibiotic therapy for to finally clear her MRSA bacteremia.  Her last blood cultures that were negative was 12/06/2019.  Therefore given the osteomyelitis she would need treatment for 6 weeks from the date of last negative blood cultures which would be tentatively 01/17/2020.  She will also need a repeat CT scan towards the end of her therapy to document resolution of the osteomyelitis.  Acute renal insufficiency: Patient noted to have elevated BUN/creatinine.  She is on high dose Lasix.  We may need to cut back on the Lasix.  She may need IV hydration.  Please monitor BUN/cr closely while on antibiotics.  She is high risk for worsening renal failure.  Avoid nephrotoxic medications.  Sepsis  with shock, currently shock resolved: As mentioned above she is high risk for recurrent sepsis.  If she starts having worsening fevers or leukocytosis would recommend to send for pan cultures.  If she has diarrhea suggest to check stool for C. Difficile.  Morbid obesity/obstructive sleep apnea: Continue supportive management per the primary team.  Paroxysmal atrial fibrillation: Continue medication and management per the primary team.  History of pulmonary embolism: She is on chronic anticoagulation with Xarelto.  Recent COVID-19 infection, in recovery: Continue supportive management per the primary team.  Sacrococcygeal pressure injury unstageable: Continue local wound care.  Unfortunately due to her debility she is high risk for worsening.  If any worsening, consider consulting surgery for evaluation.  Due to her complex medical problems she is very high risk for worsening and decompensation. Plan of care discussed with the primary team and pharmacy.  Subjective: She remains encephalopathic.  Having low-grade fevers with leukocytosis.  Objective: Vitals: Temperature 97.6, pulse 72, respiratory rate 16, blood pressure 136/68, oxygen saturation 96%  Examination: Constitutional: Morbidly obese patient, chronically ill-appearing, encephalopathic   Head: Atraumatic, normocephalic Eyes: PERLA  ENMT: external ears normal, she has pressure injury on her nose, Lips appears normal, moist oral mucosa  Neck: Obese, no masses CVS: S1-S2  Respiratory: Decreased breath sounds, rhonchi, no wheezing Abdomen: Morbidly obese, positive bowel sounds Musculoskeletal: Edema Neuro: Lethargic, not following commands.  Unable to do neurologic exam at this time.   Psych: Unable to assess Skin: Multiple pressure injuries, sacrococcygeal unstageable pressure injury    Data Reviewed: I have personally reviewed following labs and imaging studies  CBC: Recent Labs  Lab 12/27/19 1126 12/30/19 0549   WBC 11.1* 8.3  HGB  11.1* 10.2*  HCT 36.7 33.6*  MCV 99.2 100.0  PLT 256 213    Basic Metabolic Panel: Recent Labs  Lab 12/27/19 1126 12/28/19 0327 12/30/19 0549  NA 151* 149* 148*  K 3.0* 3.1* 3.6  CL 110 112* 120*  CO2 26 25 18*  GLUCOSE 130* 155* 133*  BUN 80* 74* 51*  CREATININE 1.15* 0.97 0.72  CALCIUM 9.4 9.0 8.9    GFR: CrCl cannot be calculated (Unknown ideal weight.).  Liver Function Tests: No results for input(s): AST, ALT, ALKPHOS, BILITOT, PROT, ALBUMIN in the last 168 hours.  CBG: No results for input(s): GLUCAP in the last 168 hours.   Recent Results (from the past 240 hour(s))  Culture, blood (routine x 2)     Status: None   Collection Time: 12/21/19  5:12 PM   Specimen: BLOOD LEFT HAND  Result Value Ref Range Status   Specimen Description BLOOD LEFT HAND  Final   Special Requests   Final    BOTTLES DRAWN AEROBIC ONLY Blood Culture results may not be optimal due to an inadequate volume of blood received in culture bottles   Culture   Final    NO GROWTH 5 DAYS Performed at [facility], [address], [place], [place] [address]    Report Status 12/26/2019 FINAL  Final  Culture, blood (routine x 2)     Status: None   Collection Time: 12/21/19  5:18 PM   Specimen: BLOOD LEFT HAND  Result Value Ref Range Status   Specimen Description BLOOD LEFT HAND  Final   Special Requests   Final    BOTTLES DRAWN AEROBIC AND ANAEROBIC Blood Culture results may not be optimal due to an inadequate volume of blood received in culture bottles   Culture   Final    NO GROWTH 5 DAYS Performed at [facility], [address], [place], [place] [address]    Report Status 12/26/2019 FINAL  Final  Culture, blood (Routine X 2) w Reflex to ID Panel     Status: None (Preliminary result)   Collection Time: 12/27/19 11:26 AM   Specimen: BLOOD  Result Value Ref Range Status   Specimen Description BLOOD BLOOD RIGHT HAND  Final   Special Requests   Final     BOTTLES DRAWN AEROBIC AND ANAEROBIC Blood Culture results may not be optimal due to an inadequate volume of blood received in culture bottles   Culture   Final    NO GROWTH 4 DAYS Performed at [facility], [address], [place], [place] [address]    Report Status PENDING  Incomplete  Culture, blood (Routine X 2) w Reflex to ID Panel     Status: None (Preliminary result)   Collection Time: 12/27/19 11:40 AM   Specimen: BLOOD  Result Value Ref Range Status   Specimen Description BLOOD LEFT ANTECUBITAL  Final   Special Requests   Final    AEROBIC BOTTLE ONLY Blood Culture results may not be optimal due to an inadequate volume of blood received in culture bottles   Culture   Final    NO GROWTH 4 DAYS Performed at [facility], [address], [place], [place] [address]    Report Status PENDING  Incomplete  Culture, Urine     Status: Abnormal   Collection Time: 12/28/19  7:47 AM   Specimen: Urine, Random  Result Value Ref Range Status   Specimen Description URINE, RANDOM  Final  Special Requests   Final    NONE Performed at [facility], [address] [place], [place] [id number]    Culture (A)  Final    70,000 COLONIES/mL ENTEROBACTER CLOACAE 60,000 COLONIES/mL KLEBSIELLA PNEUMONIAE    Report Status 12/31/2019 FINAL  Final   Organism ID, Bacteria ENTEROBACTER CLOACAE (A)  Final   Organism ID, Bacteria KLEBSIELLA PNEUMONIAE (A)  Final      Susceptibility   Enterobacter cloacae - MIC*    CEFAZOLIN >=64 RESISTANT Resistant     CEFEPIME 2 SENSITIVE Sensitive     CIPROFLOXACIN <=0.25 SENSITIVE Sensitive     GENTAMICIN <=1 SENSITIVE Sensitive     IMIPENEM 1 SENSITIVE Sensitive     NITROFURANTOIN 64 INTERMEDIATE Intermediate     TRIMETH/SULFA <=20 SENSITIVE Sensitive     PIP/TAZO >=128 RESISTANT Resistant     * 70,000 COLONIES/mL ENTEROBACTER CLOACAE   Klebsiella pneumoniae - MIC*    AMPICILLIN >=32 RESISTANT Resistant     CEFAZOLIN <=4 SENSITIVE Sensitive      CEFEPIME <=0.12 SENSITIVE Sensitive     CEFTRIAXONE <=0.25 SENSITIVE Sensitive     CIPROFLOXACIN <=0.25 SENSITIVE Sensitive     GENTAMICIN <=1 SENSITIVE Sensitive     IMIPENEM <=0.25 SENSITIVE Sensitive     NITROFURANTOIN 64 INTERMEDIATE Intermediate     TRIMETH/SULFA <=20 SENSITIVE Sensitive     AMPICILLIN/SULBACTAM 8 SENSITIVE Sensitive     PIP/TAZO >=128 RESISTANT Resistant     * 60,000 COLONIES/mL KLEBSIELLA PNEUMONIAE         Radiology Studies: No results found.   Scheduled Meds: Please see MAR  Vonzella NippleAnupama Kariya Lavergne, MD   12/31/2019, 4:02 PM

## 2019-12-31 NOTE — Progress Notes (Signed)
PROGRESS NOTE    Laurie Chavez  RSW:[id number] DOB: [birth year] DOA: 12/19/2019   Brief Narrative:  Laurie Chavez an 69 y.o.femalewith medical history significant for coronary disease status post PCI x12 last 1 12/2018, paroxysmal atrial fibrillation on Xarelto, chronic hypoxemic respiratory failure, morbid obesity, obstructive sleep apnea, history of pulmonary embolism who was initially hospitalized September 23, 2019 for 10 days for Covid pneumonia and again on 10/10/2019 for atrial fibrillation and seen in the ED on 10/18/2019 with kidney stone. After that she presented again to the emergency room at [facility] in [place] with complaints of chest pain, nausea, vomiting. She is on anticoagulation with Xarelto.Patient was also found to be hypotensive and septic shock. She had MRSA bacteremia and was treated with antibiotics. However, patient continued to have persistent MRSA bacteremia therefore she had to be treated with IV daptomycin and ceftaroline also had to be added. Her hospital course was complicated. She developed rapid atrial fibrillation and underwent cardioversion. She also had worsening respiratory failure and had to be transferred to the ICU where she was intubated for hypoxemia/hypercapnia. She apparently became bradycardic after intubation and went into cardiopulmonary arrest. ACLS protocol was followed and she had return of spontaneous circulation after one round of CPR. She was intubated for a few days and was extubated on 12/16/2019. After that she was on BiPAP. She remained encephalopathic after the episode. For her MRSA bacteremia infectious disease was consulted and as mentioned above she was treated with daptomycin but ceftaroline added due to persistent bacteremia. TEE was negative for valve vegetations. She has knee hardware. Per records from outside facility did not receive Rifampin.She had CT done on 11/26/2019 which showed findings  concerning for T7-T8, T6-T7 discitis/osteomyelitis. Her last negative blood cultures at the outside facility was on 12/06/2019.  12/27/2019: We tried to de-escalate her to monotherapy with daptomycin.  However, she started having low-grade fevers with worsening leukocytosis.  Also her BUN and creatinine worsened. 12/31/2019: She is more awake but still encephalopathic.  She had urine cultures from 12/28/2019 which showed Enterobacter cloacae, Klebsiella pneumoniae.    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 She had recent COVID-19 infection in recovery, she also has history of pulmonary embolism on anticoagulation, chronic hypoventilation secondary to probable obesity hypoventilation syndrome from morbid obesity, obstructive sleep apnea. She was intubated at the outside facility and was treated with broad-spectrum antimicrobials.  She is currently extubated, on oxygen. At this time she is on treatment with daptomycin, ceftaroline for MRSA bacteremia.  Chest x-ray last week showed findings concerning for pneumonia. Levaquin added.  -She has dysphagia and very high risk for aspiration and recurrent aspiration pneumonia. If her respiratory status worsens consider obtaining respiratory cultures if feasible and repeat chest imaging preferably chest CT to better evaluate.  MRSA bacteremia: Patient had MRSA bacteremia and had sepsis and shock at the outside facility. Currently shock is  resolved. Her last blood cultures that were negative at the outside facility where 12/06/2019. She is currently on treatment with dual antibiotic with daptomycin and ceftaroline. This was because she had persistent bacteremia with MRSA all through September 2021 and was not clearing the bacteremia until ceftaroline  was added to the daptomycin. She had transesophageal echocardiogram on 10/31/2019 at the outside facility which did not show any evidence of vegetations.   Last week 12/27/2019 we tried to switch to monotherapy with daptomycin.  However, she started having low-grade fever with worsening leukocytosis. Therefore added back the ceftaroline.  Chest x-ray showed findings concerning for pneumonia, therefore Levaquin added. Please monitor CBC, CMP while on the antibiotics.  Monitor CK while on the daptomycin. -If she remains stable for the next couple of days we could try to discontinue the daptomycin and attempt monotherapy with ceftaroline.  Fever/leukocytosis: As mentioned above, with right to switch to monotherapy with daptomycin.  However, she had fever and leukocytosis. Therefore added the ceftaroline back.  Chest x-ray showed findings concerning for pneumonia.  Because of that Levaquin was added. Plan as mentioned above.  UTI with Enterobacter, Klebsiella: Urine cultures from 12/28/2019 showed Enterobacter and Klebsiella.  Sensitive to fluoroquinolones.  Levaquin added which should cover both for the UTI as well as for the pneumonia.  Plan to treat for duration of 1 week pending improvement.  Suggest to discontinue the Foley catheter if feasible.  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 Unfortunately she has right knee hardware therefore was unable to get an MRI. She also had persistent bacteremia and required dual antibiotic therapy for to finally clear her MRSA  bacteremia. Her last blood cultures that were negative was 12/06/2019. Therefore given the osteomyelitis she would need treatment for 6 weeks from the date of last negative blood cultures which would be tentatively 01/17/2020.  -If she remains stable, we could try to de-escalate to monotherapy with ceftaroline in the next couple of days. She will also need a repeat CT scan towards the end of her therapy to document resolution of the osteomyelitis.  Acute renal insufficiency: Patient last week was noted to have elevated BUN/creatinine. This week improving.  Further management per the primary team. Please monitor BUN/cr closely while on antibiotics.  She is high risk for worsening renal failure.  Avoid nephrotoxic medications.  Sepsis with shock, currently shock resolved: As mentioned above she is high risk for recurrent sepsis. If she starts having worsening fevers or leukocytosis would recommend to send for pan cultures. If she has diarrhea suggest to check stool for C. Difficile.  Morbid obesity/obstructive sleep apnea: Continue supportive management per the primary team.  Paroxysmal atrial fibrillation: Continue medication and management per the primary team.  History of pulmonary embolism: She is on chronic anticoagulation with Xarelto.  Recent COVID-19 infection, in recovery: Continue supportive management per the primary team.  Sacrococcygeal pressure injury unstageable: Continue local wound care.  Unfortunately due to her debility she is high risk for worsening.  If any worsening, consider consulting surgery for evaluation.  Dysphagia: She has a Dobbhoff tube in place and high risk for aspiration and recurrent aspiration pneumonia.  Further management of dysphagia per the primary team.  Due to her complex medical problems she is very high risk for worsening and decompensation. Plan of care discussed with the primary team and pharmacy.    Subjective: She is more awake but  still encephalopathic.  Urine cultures showing Enterobacter, Klebsiella.  Having low-grade fever, T-max 99.2.  Objective: Vitals: Temperature 98.8, T-max 99.2, pulse 78, respiratory rate 22, blood pressure 131/80, pulse oximetry 98% on oxygen nasal cannula  Examination: Constitutional:Morbidly obese patient, chronically ill-appearing Head:Atraumatic, normocephalic Eyes:  PERLA  ENMT: external earsnormal, has Dobbhoff tube, lips appears normal,moist oral mucosa Neck:Obese, no masses CVS: S1-S2  Respiratory:Decreased breath sounds, rhonchi, no wheezing Abdomen:Morbidly obese, positive bowel sounds Musculoskeletal:Edema Neuro:Opening eyes but not following commands, appears to have severe debility with generalized weakness Psych:Unable to assess Skin:Multiple pressure injuries, sacrococcygeal unstageable pressure injury   Data Reviewed: I have personally reviewed following labs and imaging studies  CBC: Recent Labs  Lab 12/27/19 1126 12/30/19 0549  WBC 11.1* 8.3  HGB 11.1* 10.2*  HCT 36.7 33.6*  MCV 99.2 100.0  PLT 256 213    Basic Metabolic Panel: Recent Labs  Lab 12/27/19 1126 12/28/19 0327 12/30/19 0549  NA 151* 149* 148*  K 3.0* 3.1* 3.6  CL 110 112* 120*  CO2 26 25 18*  GLUCOSE 130* 155* 133*  BUN 80* 74* 51*  CREATININE 1.15* 0.97 0.72  CALCIUM 9.4 9.0 8.9    GFR: CrCl cannot be calculated (Unknown ideal weight.).  Liver Function Tests: No results for input(s): AST, ALT, ALKPHOS, BILITOT, PROT, ALBUMIN in the last 168 hours.  CBG: No results for input(s): GLUCAP in the last 168 hours.   Recent Results (from the past 240 hour(s))  Culture, blood (Routine X 2) w Reflex to ID Panel     Status: None (Preliminary result)   Collection Time: 12/27/19 11:26 AM   Specimen: BLOOD  Result Value Ref Range Status   Specimen Description BLOOD BLOOD RIGHT HAND  Final   Special Requests   Final    BOTTLES DRAWN AEROBIC AND ANAEROBIC Blood Culture  results may not be optimal due to an inadequate volume of blood received in culture bottles   Culture   Final    NO GROWTH 4 DAYS Performed at [facility], [address], [place], [place] [address]    Report Status PENDING  Incomplete  Culture, blood (Routine X 2) w Reflex to ID Panel     Status: None (Preliminary result)   Collection Time: 12/27/19 11:40 AM   Specimen: BLOOD  Result Value Ref Range Status   Specimen Description BLOOD LEFT ANTECUBITAL  Final   Special Requests   Final    AEROBIC BOTTLE ONLY Blood Culture results may not be optimal due to an inadequate volume of blood received in culture bottles   Culture   Final    NO GROWTH 4 DAYS Performed at [facility], [address], [place], [place] [address]    Report Status PENDING  Incomplete  Culture, Urine     Status: Abnormal   Collection Time: 12/28/19  7:47 AM   Specimen: Urine, Random  Result Value Ref Range Status   Specimen Description URINE, RANDOM  Final   Special Requests   Final    NONE Performed at [facility], [address] [place], [place] [address]    Culture (A)  Final    70,000 COLONIES/mL ENTEROBACTER CLOACAE 60,000 COLONIES/mL KLEBSIELLA PNEUMONIAE    Report Status 12/31/2019 FINAL  Final   Organism ID, Bacteria ENTEROBACTER CLOACAE (A)  Final   Organism ID, Bacteria KLEBSIELLA PNEUMONIAE (A)  Final      Susceptibility   Enterobacter cloacae - MIC*    CEFAZOLIN >=64 RESISTANT Resistant     CEFEPIME 2 SENSITIVE Sensitive     CIPROFLOXACIN <=0.25 SENSITIVE Sensitive     GENTAMICIN <=1 SENSITIVE Sensitive     IMIPENEM 1 SENSITIVE Sensitive     NITROFURANTOIN 64 INTERMEDIATE Intermediate     TRIMETH/SULFA <=  20 SENSITIVE Sensitive     PIP/TAZO >=128 RESISTANT Resistant     * 70,000 COLONIES/mL ENTEROBACTER CLOACAE   Klebsiella pneumoniae - MIC*    AMPICILLIN >=32 RESISTANT Resistant     CEFAZOLIN <=4 SENSITIVE Sensitive     CEFEPIME <=0.12 SENSITIVE Sensitive      CEFTRIAXONE <=0.25 SENSITIVE Sensitive     CIPROFLOXACIN <=0.25 SENSITIVE Sensitive     GENTAMICIN <=1 SENSITIVE Sensitive     IMIPENEM <=0.25 SENSITIVE Sensitive     NITROFURANTOIN 64 INTERMEDIATE Intermediate     TRIMETH/SULFA <=20 SENSITIVE Sensitive     AMPICILLIN/SULBACTAM 8 SENSITIVE Sensitive     PIP/TAZO >=128 RESISTANT Resistant     * 60,000 COLONIES/mL KLEBSIELLA PNEUMONIAE  Carbapenem Resistance Panel     Status: None   Collection Time: 12/28/19  7:47 AM  Result Value Ref Range Status   Carba Resistance IMP Gene NOT DETECTED NOT DETECTED Final   Carba Resistance VIM Gene NOT DETECTED NOT DETECTED Final   Carba Resistance NDM Gene NOT DETECTED NOT DETECTED Final   Carba Resistance KPC Gene NOT DETECTED NOT DETECTED Final   Carba Resistance OXA48 Gene NOT DETECTED NOT DETECTED Final    Comment: (NOTE) Cepheid Carba-R is an FDA-cleared nucleic acid amplification test  (NAAT)for the detection and differentiation of genes encoding the  most prevalent carbapenemases in bacterial isolate samples. Carbapenemase gene identification and implementation of comprehensive  infection control measures are recommended by the CDC to prevent the  spread of the resistant organisms. Performed at [facility], [address] [place], [place] [id number]       Radiology Studies: No results found.    Scheduled Meds: Please see MAR    Vonzella NippleAnupama Ameyah Bangura, MD   12/31/2019, 5:25 PM

## 2020-01-01 LAB — CULTURE, BLOOD (ROUTINE X 2)
Culture: NO GROWTH
Culture: NO GROWTH

## 2020-01-04 ENCOUNTER — Other Ambulatory Visit (HOSPITAL_COMMUNITY): Payer: Medicare Other

## 2020-01-05 LAB — CBC
HCT: 37.1 % (ref 36.0–46.0)
Hemoglobin: 11.3 g/dL — ABNORMAL LOW (ref 12.0–15.0)
MCH: 30.1 pg (ref 26.0–34.0)
MCHC: 30.5 g/dL (ref 30.0–36.0)
MCV: 98.7 fL (ref 80.0–100.0)
Platelets: 209 10*3/uL (ref 150–400)
RBC: 3.76 MIL/uL — ABNORMAL LOW (ref 3.87–5.11)
RDW: 16.6 % — ABNORMAL HIGH (ref 11.5–15.5)
WBC: 8.7 10*3/uL (ref 4.0–10.5)
nRBC: 0 % (ref 0.0–0.2)

## 2020-01-05 LAB — BASIC METABOLIC PANEL
Anion gap: 15 (ref 5–15)
BUN: 31 mg/dL — ABNORMAL HIGH (ref 8–23)
CO2: 11 mmol/L — ABNORMAL LOW (ref 22–32)
Calcium: 8.9 mg/dL (ref 8.9–10.3)
Chloride: 111 mmol/L (ref 98–111)
Creatinine, Ser: 0.72 mg/dL (ref 0.44–1.00)
GFR, Estimated: >60 mL/min (ref >60)
Glucose, Bld: 97 mg/dL (ref 70–99)
Potassium: 4.8 mmol/L (ref 3.5–5.1)
Sodium: 137 mmol/L (ref 135–145)

## 2020-01-10 LAB — BASIC METABOLIC PANEL
Anion gap: 13 (ref 5–15)
BUN: 13 mg/dL (ref 8–23)
CO2: 14 mmol/L — ABNORMAL LOW (ref 22–32)
Calcium: 9.5 mg/dL (ref 8.9–10.3)
Chloride: 113 mmol/L — ABNORMAL HIGH (ref 98–111)
Creatinine, Ser: 0.67 mg/dL (ref 0.44–1.00)
GFR, Estimated: >60 mL/min (ref >60)
Glucose, Bld: 102 mg/dL — ABNORMAL HIGH (ref 70–99)
Potassium: 2.7 mmol/L — CL (ref 3.5–5.1)
Sodium: 140 mmol/L (ref 135–145)

## 2020-01-10 LAB — CBC
HCT: 37.2 % (ref 36.0–46.0)
Hemoglobin: 11.8 g/dL — ABNORMAL LOW (ref 12.0–15.0)
MCH: 29.9 pg (ref 26.0–34.0)
MCHC: 31.7 g/dL (ref 30.0–36.0)
MCV: 94.2 fL (ref 80.0–100.0)
Platelets: 175 10*3/uL (ref 150–400)
RBC: 3.95 MIL/uL (ref 3.87–5.11)
RDW: 16.8 % — ABNORMAL HIGH (ref 11.5–15.5)
WBC: 6.2 10*3/uL (ref 4.0–10.5)
nRBC: 0 % (ref 0.0–0.2)

## 2020-01-10 LAB — CK: Total CK: 26 U/L — ABNORMAL LOW (ref 38–234)

## 2020-01-11 LAB — POTASSIUM: Potassium: 3.7 mmol/L (ref 3.5–5.1)

## 2020-01-11 NOTE — Progress Notes (Signed)
PROGRESS NOTE    Laurie Chavez  TDV:[id number] DOB: [birth year] DOA: 12/19/2019   Brief Narrative:  Laurie Chavez an 69 y.o.femalewith medical history significant for coronary disease status post PCI x12 last 1 12/2018, paroxysmal atrial fibrillation on Xarelto, chronic hypoxemic respiratory failure, morbid obesity, obstructive sleep apnea, history of pulmonary embolism who was initially hospitalized September 23, 2019 for 10 days for Covid pneumonia and again on 10/10/2019 for atrial fibrillation and seen in the ED on 10/18/2019 with kidney stone. After that she presented again to the emergency room at [facility] in [facility] with complaints of chest pain, nausea, vomiting. Patient was also found to be hypotensive and septic shock. She had MRSA bacteremia and was treated with antibiotics. However, patient continued to have persistent MRSA bacteremia therefore she had to be treated with IV daptomycin and ceftaroline also had to be added. Her hospital course was complicated. She developed rapid atrial fibrillation and underwent cardioversion. She also had worsening respiratory failure and had to be transferred to the ICU where she was intubated for hypoxemia/hypercapnia. She apparently became bradycardic after intubation and went into cardiopulmonary arrest. ACLS protocol was followed and she had return of spontaneous circulation after one round of CPR. She was intubated for a few days and was extubated on 12/16/2019. After that she was on BiPAP. She remained encephalopathic after the episode. For her MRSA bacteremia infectious disease was consulted and as mentioned above she was treated with daptomycin but ceftaroline added due to persistent bacteremia. TEE was negative for valve vegetations. She has knee hardware. Per records from outside facility did not receive Rifampin.She had CT done on 11/26/2019 which showed findings concerning for T6-T7, T7-T8,   discitis/osteomyelitis. Her last negative blood cultures at the outside facility was on 12/06/2019.  12/27/2019: We tried to de-escalate her to monotherapy with daptomycin.  However, she started having low-grade fevers with worsening leukocytosis.  Also her BUN and creatinine worsened. 12/31/2019: She is more awake but still encephalopathic.  She had urine cultures from 12/28/2019 which showed Enterobacter cloacae, Klebsiella pneumoniae.  01/11/2020: She is more awake today.  She is complaining of generalized weakness.   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 She had recent COVID-19 infection in recovery, she also has history of pulmonary embolism on anticoagulation, chronic hypoventilation secondary to probable obesity hypoventilation syndrome from morbid obesity, obstructive sleep apnea. She was intubated at the outside facility and was treated with broad-spectrum antimicrobials.  She is currently extubated, respiratory status appears to be stable at this time. She is on treatment with daptomycin, ceftaroline for MRSA bacteremia.  Chest x-ray previously showed findings concerning for pneumonia for which she was treated with Levaquin.  -She has dysphagia and high risk for aspiration and recurrent aspiration pneumonia. If her respiratory status worsens consider obtaining respiratory cultures if feasible and repeat chest imaging preferably chest CT to better evaluate.  MRSA  bacteremia: Patient had MRSA bacteremia and had sepsis and shock at the outside facility. Currently shock is resolved. Her last negative blood cultures at the outside facility on 12/06/2019. She is currently on treatment with dual antibiotic with daptomycin and ceftaroline. This was because she had persistent  bacteremia with MRSA all through September 2021 and was not clearing the bacteremia until ceftaroline was added to the daptomycin. She had transesophageal echocardiogram on 10/31/2019 at the outside facility which did not show any evidence of vegetations.   On11/18/2021 we tried to switch to monotherapy with daptomycin.  However, she started having low-grade fever with worsening leukocytosis. Therefore added back the ceftaroline.  Chest x-ray showed findings concerning for pneumonia, therefore she was treated with Levaquin. Please monitor CBC, CMP while on the antibiotics.  Monitor CK while on the daptomycin. -Tentative end date for the antibiotics is 01/17/2020.  Fever/leukocytosis: As mentioned above, we attempted to switch to monotherapy with daptomycin.  However, she had fever and leukocytosis. Therefore added the ceftaroline back.  Chest x-ray showed findings concerning for pneumonia.  Because of that she was treated with Levaquin.  Currently improving. Plan as mentioned above.  UTI with Enterobacter, Klebsiella: Urine cultures from 12/28/2019 showed Enterobacter and Klebsiella.  Sensitive to fluoroquinolones.  She was treated with Levaquin which should have covered both for the UTI as well as for the pneumonia.   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 Unfortunately she has right knee hardware therefore was unable to get an MRI. She also had persistent bacteremia and required dual antibiotic therapy for to finally clear her MRSA bacteremia. Her last blood cultures that  were negative was 12/06/2019. Therefore given the osteomyelitis she would need treatment for 6 weeks from the date of last negative blood cultures which would be tentatively 01/17/2020.  -She will also need a repeat CT scan of the T-spine towards the end of her therapy to document resolution of the osteomyelitis.  Acute renal insufficiency: Previously noted to have elevated BUN/creatinine.  Now improving.  Further management per the primary team. Please monitor BUN/cr closely while on antibiotics.  She is high risk for worsening renal failure.  Avoid nephrotoxic medications.  Sepsis with shock, currently shock resolved: As mentioned above she is high risk for recurrent sepsis. If she starts having worsening fevers or leukocytosis would recommend to send for pan cultures. If she has diarrhea suggest to check stool for C. Difficile.  Morbid obesity/obstructive sleep apnea: Continue supportive management per the primary team.  Paroxysmal atrial fibrillation: Continue medication and management per the primary team.  History of pulmonary embolism: She is on chronic anticoagulation with Xarelto.  Recent COVID-19 infection, in recovery: Continue supportive management per the primary team.  Sacrococcygeal pressure injury unstageable: Continue local wound care.  Unfortunately due to her debility she is high risk for worsening.  If any worsening, consider consulting surgery for evaluation.  Dysphagia: Improving. Further management of dysphagia per the primary team.  Due to her complex medical problems she is very high risk for worsening and decompensation. Plan of care discussed with the patient, daughter at the bedside and primary team and pharmacy.    Subjective: She is more awake.  No acute events reported.  She denies having any major complaints at this time.  Objective: Vitals: Temperature 97.8, pulse 75, respiratory rate 24, blood pressure 131/70    Examination: Constitutional:Morbidly obese patient,  awake, not in any acute distress at this time Head:Atraumatic, normocephalic Eyes: PERLA  ENMT: external earsnormal, has Dobbhoff tube, lips appears normal,moist oral mucosa Neck:Obese, no masses CVS: S1-S2  Respiratory:Decreased breath sounds, rhonchi, no wheezing Abdomen:Morbidly obese, positive bowel sounds Musculoskeletal:Edema Neuro:Awake and oriented x3, has severe  debility with generalized weakness more in the lower extremities  Psych:Stable Skin:Multiple pressure injuries, sacrococcygeal unstageable pressure injury   Data Reviewed: I have personally reviewed following labs and imaging studies  CBC: Recent Labs  Lab 01/05/20 0451 01/10/20 0717  WBC 8.7 6.2  HGB 11.3* 11.8*  HCT 37.1 37.2  MCV 98.7 94.2  PLT 209 175    Basic Metabolic Panel: Recent Labs  Lab 01/05/20 0451 01/10/20 0717 01/11/20 0412  NA 137 140  --   K 4.8 2.7* 3.7  CL 111 113*  --   CO2 11* 14*  --   GLUCOSE 97 102*  --   BUN 31* 13  --   CREATININE 0.72 0.67  --   CALCIUM 8.9 9.5  --     GFR: CrCl cannot be calculated (Unknown ideal weight.).  Liver Function Tests: No results for input(s): AST, ALT, ALKPHOS, BILITOT, PROT, ALBUMIN in the last 168 hours.  CBG: No results for input(s): GLUCAP in the last 168 hours.   No results found for this or any previous visit (from the past 240 hour(s)).    Radiology Studies: No results found.    Scheduled Meds: Please see MAR    Vonzella Nipple, MD   01/11/2020, 3:42 PM

## 2020-01-13 ENCOUNTER — Other Ambulatory Visit (HOSPITAL_COMMUNITY): Payer: Medicare Other

## 2020-01-15 LAB — CBC
HCT: 32.7 % — ABNORMAL LOW (ref 36.0–46.0)
Hemoglobin: 10.4 g/dL — ABNORMAL LOW (ref 12.0–15.0)
MCH: 30.1 pg (ref 26.0–34.0)
MCHC: 31.8 g/dL (ref 30.0–36.0)
MCV: 94.8 fL (ref 80.0–100.0)
Platelets: 113 10*3/uL — ABNORMAL LOW (ref 150–400)
RBC: 3.45 MIL/uL — ABNORMAL LOW (ref 3.87–5.11)
RDW: 16.8 % — ABNORMAL HIGH (ref 11.5–15.5)
WBC: 6 10*3/uL (ref 4.0–10.5)
nRBC: 0 % (ref 0.0–0.2)

## 2020-01-15 LAB — BASIC METABOLIC PANEL
Anion gap: 12 (ref 5–15)
BUN: 8 mg/dL (ref 8–23)
CO2: 15 mmol/L — ABNORMAL LOW (ref 22–32)
Calcium: 8.2 mg/dL — ABNORMAL LOW (ref 8.9–10.3)
Chloride: 115 mmol/L — ABNORMAL HIGH (ref 98–111)
Creatinine, Ser: 0.57 mg/dL (ref 0.44–1.00)
GFR, Estimated: >60 mL/min (ref >60)
Glucose, Bld: 88 mg/dL (ref 70–99)
Potassium: 2.4 mmol/L — CL (ref 3.5–5.1)
Sodium: 142 mmol/L (ref 135–145)

## 2020-01-16 ENCOUNTER — Other Ambulatory Visit (HOSPITAL_COMMUNITY): Payer: Medicare Other

## 2020-01-16 LAB — POTASSIUM: Potassium: 4.1 mmol/L (ref 3.5–5.1)

## 2020-01-16 MED ORDER — IOHEXOL 300 MG/ML  SOLN
100.0000 mL | Freq: Once | INTRAMUSCULAR | Status: AC | PRN
  Administered 2020-01-16: 100 mL via INTRAVENOUS

## 2020-01-17 ENCOUNTER — Other Ambulatory Visit (HOSPITAL_COMMUNITY): Payer: Medicare Other

## 2020-01-17 LAB — SARS CORONAVIRUS 2 (TAT 6-24 HRS): SARS Coronavirus 2: NEGATIVE

## 2020-01-17 NOTE — Progress Notes (Signed)
PROGRESS NOTE    Laurie Chavez  PIR:[id number] DOB: Jan 16, 1951 DOA: 12/19/2019   Brief Narrative:  Laurie Chavez an 69 y.o.femalewith medical history significant for coronary disease status post PCI x12 last 1 12/2018, paroxysmal atrial fibrillation on Xarelto, chronic hypoxemic respiratory failure, morbid obesity, obstructive sleep apnea, history of pulmonary embolism who was initially hospitalized September 23, 2019 for 10 days for Covid pneumonia and again on 10/10/2019 for atrial fibrillation and seen in the ED on 10/18/2019 with kidney stone. After that she presented again to the emergency room at [facility] in [facility] with complaints of chest pain, nausea, vomiting. Patient was also found to be hypotensive and septic shock. She had MRSA bacteremia and was treated with antibiotics. However, patient continued to have persistent MRSA bacteremia therefore she had to be treated with IV daptomycin and ceftaroline also had to be added. Her hospital course was complicated. She developed rapid atrial fibrillation and underwent cardioversion. She also had worsening respiratory failure and had to be transferred to the ICU where she was intubated for hypoxemia/hypercapnia. She apparently became bradycardic after intubation and went into cardiopulmonary arrest. ACLS protocol was followed and she had return of spontaneous circulation after one round of CPR. She was intubated for a few days and was extubated on 12/16/2019. After that she was on BiPAP. She remained encephalopathic after the episode. For her MRSA bacteremia infectious disease was consulted and as mentioned above she was treated with daptomycin but ceftaroline added due to persistent bacteremia. TEE was negative for valve vegetations. She has knee hardware. Per records from outside facility did not receive Rifampin.She had CT done on 11/26/2019 which showed findings concerning for T6-T7, T7-T8,   discitis/osteomyelitis. Her last negative blood cultures at the outside facility was on 12/06/2019.  12/27/2019: We tried to de-escalate her to monotherapy with daptomycin.  However, she started having low-grade fevers with worsening leukocytosis.  Also her BUN and creatinine worsened. 12/31/2019: She is more awake but still encephalopathic.  She had urine cultures from 12/28/2019 which showed Enterobacter cloacae, Klebsiella pneumoniae.  01/11/2020: She is more awake today.   01/17/20: She is complaining of weakness and swelling in her bilateral feet.  CT thoracic spine showing T6-T7, T7-T8 discitis/osteomyelitis with advanced endplate destruction, T8 partial vertebral collapse.   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 She had recent COVID-19 infection in recovery, she also has history of pulmonary embolism on anticoagulation, chronic hypoventilation secondary to probable obesity hypoventilation syndrome from morbid obesity, obstructive sleep apnea. She was intubated at the outside facility and was treated with broad-spectrum antimicrobials.  She is currently extubated, respiratory status appears to be stable at this time. Completing treatment with daptomycin, ceftaroline for MRSA bacteremia.  Chest x-ray previously showed findings concerning for pneumonia for which she was treated with Levaquin.  -She has dysphagia and high risk for aspiration and recurrent  aspiration pneumonia. If her respiratory status worsens consider obtaining respiratory cultures if feasible and repeat chest imaging preferably chest CT to better evaluate.  MRSA bacteremia: Patient had MRSA bacteremia and had sepsis and shock at the outside facility. Currently shock is resolved. Her last negative blood cultures at  the outside facility on 12/06/2019. She is completing treatment with dual antibiotic with daptomycin and ceftaroline. This was because she had persistent bacteremia with MRSA all through September 2021 and was not clearing the bacteremia until ceftaroline was added to the daptomycin. She had transesophageal echocardiogram on 10/31/2019 at the outside facility which did not show any evidence of vegetations.   On 12/27/2019 we tried to switch to monotherapy with daptomycin.  However, she started having low-grade fever with worsening leukocytosis. Therefore added back the ceftaroline.  Chest x-ray showed findings concerning for pneumonia, therefore she was treated with Levaquin.  01/17/2020: CT of the thoracic spine done on 01/16/2020 showing T6-T7, T7-T8 discitis/osteomyelitis, advanced endplate destruction, T8 partial vertebral collapse, paravertebral edema without visible abscess.  T1-2 disc disease with endplate irregularity which could be degenerative or from discitis.   -Given these findings will extend the daptomycin for 2 more weeks after which she probably will have to be on prolonged antibiotic suppression with doxycycline. Please monitor CBC, CMP while on the antibiotics.  Monitor CK while on the daptomycin.  Fever/leukocytosis: As mentioned above, we attempted to switch to monotherapy with daptomycin.  However, she had fever and leukocytosis. Therefore added the ceftaroline back.  Chest x-ray showed findings concerning for pneumonia.  Because of that she was treated with Levaquin.  Currently improving. Plan as mentioned above.  UTI with Enterobacter, Klebsiella:  Urine cultures from 12/28/2019 showed Enterobacter and Klebsiella.  Sensitive to fluoroquinolones.  She was treated with Levaquin which should have covered both for the UTI as well as for the pneumonia.  She denies having any urinary complaints at this time.  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 Unfortunately she has right knee hardware therefore was unable to get an MRI. She also had persistent bacteremia and required dual antibiotic therapy for to finally clear her MRSA bacteremia. Her last blood cultures that were negative was 12/06/2019. She is completing treatment for 6 weeks from the date of last negative blood cultures which is ending 01/17/2020.  - CT T-spine done on 01/16/2020 showing T6-T7, T7-T8 discitis/osteomyelitis with endplate destruction, T8 partial vertebral collapse.  Some paravertebral edema without visible abscess.  T1-T2 disc disease with endplate irregularity likely degenerative or from discitis.  Given these findings will plan to extend the daptomycin for another 2 weeks after which she will have to be on long-term doxycycline. -Per staff discharge planning being done.  As mentioned above monitor CK while on the daptomycin.  Acute renal insufficiency: Previously noted to have elevated BUN/creatinine.  Now improving.  Further management per the primary team. Please monitor BUN/cr closely while on antibiotics.  She is high risk for worsening renal failure.  Avoid nephrotoxic medications.  Sepsis with shock, currently shock resolved: As mentioned above she is high risk for recurrent sepsis. If she starts having worsening fevers or leukocytosis would recommend to send for pan cultures. If she has diarrhea suggest to check stool for C. Difficile.  Morbid obesity/obstructive sleep apnea: Continue supportive management per the primary team.  Paroxysmal  atrial fibrillation: Continue medication and management per the primary team.  History of pulmonary embolism: She is on chronic anticoagulation with Xarelto.  Recent COVID-19 infection, in recovery: Continue supportive management per the primary team.  Sacrococcygeal pressure injury unstageable: Continue local wound care.  Unfortunately due to her debility she is high risk for worsening.  If any worsening, consider  consulting surgery for evaluation.  Dysphagia: Improving. Further management of dysphagia per the primary team.  Due to her complex medical problems she is very high risk for worsening and decompensation. Plan of care discussed with the patient, son at the bedside and primary team and pharmacy.    Subjective: She is complaining of swelling of her feet and generalized debility and weakness.  Does not report any new focal deficits.  Objective: Vitals: Temperature 98.2, pulse rate 66, respiratory rate 18, blood pressure 132/68   Examination: Constitutional:Morbidly obese,  awake, not in any acute distress at this time Head:Atraumatic, normocephalic Eyes: PERLA  ENMT: external earsnormal, lips appears normal,moist oral mucosa Neck:no masses CVS: S1-S2  Respiratory:Decreased breath sounds, rhonchi, no wheezing Abdomen:Morbidly obese, nontender, positive bowel sounds Musculoskeletal:Edema Neuro:Awake and oriented x3, has debility with generalized weakness more in the lower extremities  Psych:Stable Skin:Multiple pressure injuries, sacrococcygeal pressure injury   Data Reviewed: I have personally reviewed following labs and imaging studies  CBC: Recent Labs  Lab 01/15/20 0729  WBC 6.0  HGB 10.4*  HCT 32.7*  MCV 94.8  PLT 113*    Basic Metabolic Panel: Recent Labs  Lab 01/11/20 0412 01/15/20 0729 01/16/20 0440  NA  --  142  --   K 3.7 2.4* 4.1  CL  --  115*  --   CO2  --  15*  --   GLUCOSE  --  88  --   BUN  --  8  --   CREATININE  --   0.57  --   CALCIUM  --  8.2*  --     GFR: CrCl cannot be calculated (Unknown ideal weight.).  Liver Function Tests: No results for input(s): AST, ALT, ALKPHOS, BILITOT, PROT, ALBUMIN in the last 168 hours.  CBG: No results for input(s): GLUCAP in the last 168 hours.   No results found for this or any previous visit (from the past 240 hour(s)).    Radiology Studies: CT THORACIC SPINE W CONTRAST  Result Date: 01/16/2020 CLINICAL DATA:  Mid back pain and osteomyelitis EXAM: CT THORACIC SPINE WITH CONTRAST TECHNIQUE: Multidetector CT images of thoracic was performed according to the standard protocol following intravenous contrast administration. CONTRAST:  OMNIPAQUE IOHEXOL 300 MG/ML  SOLN COMPARISON:  None. FINDINGS: Alignment: Exaggerated kyphosis at the levels of disc collapse in the midthoracic spine. Vertebrae: Endplate sclerosis and destruction at T6-7 and especially T7-8, with superior endplate depression greatest at T8. These findings correlate with notes reporting known discitis osteomyelitis at these levels on outside imaging. Paravertebral fat stranding is likely present but no visible paravertebral abscess. Endplate sclerosis and irregularity at T1-2 without adjacent fat stranding, which could be from prior inflammation or degenerative. No acute fracture. No focal bone lesion. Paraspinal and other soft tissues: As above. There also trace pleural effusions and mild atelectasis. The heart is enlarged. Disc levels: Aside from the discitis findings there is generalized disc narrowing and endplate degeneration with ventral endplate spurring. Degenerative facet spurring in the mid to lower thoracic spine without focal or advanced level. A small calcified disc protrusion is seen left paracentral at T7-8 and right paracentral at T3-4. No visible cord compression. IMPRESSION: 1. T6-7 and T7-8 discitis/osteomyelitis which is known per chart. Endplate destruction is advanced and there is  especially T8 partial vertebral collapse. No available comparison imaging or reports to determine if there has been progression. There is some paravertebral edema without visible abscess. 2. T1-2 disc disease with endplate irregularity which could be  degenerative or from discitis (even more remote). Electronically Signed   By: Marnee Spring M.D.   On: 01/16/2020 04:08      Scheduled Meds: Please see MAR    Vonzella Nipple, MD   01/17/2020, 2:34 PM

## 2020-01-17 NOTE — Progress Notes (Signed)
PROGRESS NOTE    Laurie Chavez  PIR:[id number] DOB: Jan 16, 1951 DOA: 12/19/2019   Brief Narrative:  Laurie Chavez an 69 y.o.femalewith medical history significant for coronary disease status post PCI x12 last 1 12/2018, paroxysmal atrial fibrillation on Xarelto, chronic hypoxemic respiratory failure, morbid obesity, obstructive sleep apnea, history of pulmonary embolism who was initially hospitalized September 23, 2019 for 10 days for Covid pneumonia and again on 10/10/2019 for atrial fibrillation and seen in the ED on 10/18/2019 with kidney stone. After that she presented again to the emergency room at [facility] in [facility] with complaints of chest pain, nausea, vomiting. Patient was also found to be hypotensive and septic shock. She had MRSA bacteremia and was treated with antibiotics. However, patient continued to have persistent MRSA bacteremia therefore she had to be treated with IV daptomycin and ceftaroline also had to be added. Her hospital course was complicated. She developed rapid atrial fibrillation and underwent cardioversion. She also had worsening respiratory failure and had to be transferred to the ICU where she was intubated for hypoxemia/hypercapnia. She apparently became bradycardic after intubation and went into cardiopulmonary arrest. ACLS protocol was followed and she had return of spontaneous circulation after one round of CPR. She was intubated for a few days and was extubated on 12/16/2019. After that she was on BiPAP. She remained encephalopathic after the episode. For her MRSA bacteremia infectious disease was consulted and as mentioned above she was treated with daptomycin but ceftaroline added due to persistent bacteremia. TEE was negative for valve vegetations. She has knee hardware. Per records from outside facility did not receive Rifampin.She had CT done on 11/26/2019 which showed findings concerning for T6-T7, T7-T8,   discitis/osteomyelitis. Her last negative blood cultures at the outside facility was on 12/06/2019.  12/27/2019: We tried to de-escalate her to monotherapy with daptomycin.  However, she started having low-grade fevers with worsening leukocytosis.  Also her BUN and creatinine worsened. 12/31/2019: She is more awake but still encephalopathic.  She had urine cultures from 12/28/2019 which showed Enterobacter cloacae, Klebsiella pneumoniae.  01/11/2020: She is more awake today.   01/17/20: She is complaining of weakness and swelling in her bilateral feet.  CT thoracic spine showing T6-T7, T7-T8 discitis/osteomyelitis with advanced endplate destruction, T8 partial vertebral collapse.   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 She had recent COVID-19 infection in recovery, she also has history of pulmonary embolism on anticoagulation, chronic hypoventilation secondary to probable obesity hypoventilation syndrome from morbid obesity, obstructive sleep apnea. She was intubated at the outside facility and was treated with broad-spectrum antimicrobials.  She is currently extubated, respiratory status appears to be stable at this time. Completing treatment with daptomycin, ceftaroline for MRSA bacteremia.  Chest x-ray previously showed findings concerning for pneumonia for which she was treated with Levaquin.  -She has dysphagia and high risk for aspiration and recurrent  aspiration pneumonia. If her respiratory status worsens consider obtaining respiratory cultures if feasible and repeat chest imaging preferably chest CT to better evaluate.  MRSA bacteremia: Patient had MRSA bacteremia and had sepsis and shock at the outside facility. Currently shock is resolved. Her last negative blood cultures at  the outside facility on 12/06/2019. She is completing treatment with dual antibiotic with daptomycin and ceftaroline. This was because she had persistent bacteremia with MRSA all through September 2021 and was not clearing the bacteremia until ceftaroline was added to the daptomycin. She had transesophageal echocardiogram on 10/31/2019 at the outside facility which did not show any evidence of vegetations.   On 12/27/2019 we tried to switch to monotherapy with daptomycin.  However, she started having low-grade fever with worsening leukocytosis. Therefore added back the ceftaroline.  Chest x-ray showed findings concerning for pneumonia, therefore she was treated with Levaquin.  01/17/2020: CT of the thoracic spine done on 01/16/2020 showing T6-T7, T7-T8 discitis/osteomyelitis, advanced endplate destruction, T8 partial vertebral collapse, paravertebral edema without visible abscess.  T1-2 disc disease with endplate irregularity which could be degenerative or from discitis.   -Given these findings recommend to extend the daptomycin for 2 more weeks after which she probably will have to be on prolonged antibiotic suppression with either Zyvox or doxycycline. Please monitor CBC, CMP while on the antibiotics.  Monitor CK while on the daptomycin.  Fever/leukocytosis: As mentioned above, we attempted to switch to monotherapy with daptomycin.  However, she had fever and leukocytosis. Therefore added the ceftaroline back.  Chest x-ray showed findings concerning for pneumonia.  Because of that she was treated with Levaquin.  Currently improving. Plan as mentioned above.  UTI with  Enterobacter, Klebsiella: Urine cultures from 12/28/2019 showed Enterobacter and Klebsiella.  Sensitive to fluoroquinolones.  She was treated with Levaquin which should have covered both for the UTI as well as for the pneumonia.  She denies having any urinary complaints at this time.  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 Unfortunately she has right knee hardware therefore was unable to get an MRI. She also had persistent bacteremia and required dual antibiotic therapy for to finally clear her MRSA bacteremia. Her last blood cultures that were negative was 12/06/2019. She is completing treatment for 6 weeks from the date of last negative blood cultures which is ending 01/17/2020.  - CT T-spine done on 01/16/2020 showing T6-T7, T7-T8 discitis/osteomyelitis with endplate destruction, T8 partial vertebral collapse.  Some paravertebral edema without visible abscess.  T1-T2 disc disease with endplate irregularity likely degenerative or from discitis.  Given these findings will plan to extend the daptomycin for another 2 weeks after which she will have to be on prolonged antibiotic suppression with either Zyvox or doxycycline. -Per staff discharge planning being done.  As mentioned above monitor CK while on the daptomycin.  Acute renal insufficiency: Previously noted to have elevated BUN/creatinine.  Now improving.  Further management per the primary team. Please monitor BUN/cr closely while on antibiotics.  She is high risk for worsening renal failure.  Avoid nephrotoxic medications.  Sepsis with shock, currently shock resolved: As mentioned above she is high risk for recurrent sepsis. If she starts having worsening fevers or leukocytosis would recommend to send for pan cultures. If she has diarrhea suggest to check stool for C. Difficile.  Morbid obesity/obstructive sleep apnea:  Continue supportive management per the primary team.  Paroxysmal atrial fibrillation: Continue medication and management per the primary team.  History of pulmonary embolism: She is on chronic anticoagulation with Xarelto.  Recent COVID-19 infection, in recovery: Continue supportive management per the primary team.  Sacrococcygeal pressure injury unstageable: Continue local wound care.  Unfortunately due to her debility she  is high risk for worsening.  If any worsening, consider consulting surgery for evaluation.  Dysphagia: Improving. Further management of dysphagia per the primary team.  Due to her complex medical problems she is very high risk for worsening and decompensation. Plan of care discussed with the patient, son at the bedside and primary team and pharmacy.    Subjective: She is complaining of swelling of her feet and generalized debility and weakness.  Does not report any new focal deficits.  Objective: Vitals: Temperature 98.2, pulse rate 66, respiratory rate 18, blood pressure 132/68   Examination: Constitutional:Morbidly obese,  awake, not in any acute distress at this time Head:Atraumatic, normocephalic Eyes: PERLA  ENMT: external earsnormal, lips appears normal,moist oral mucosa Neck:no masses CVS: S1-S2  Respiratory:Decreased breath sounds, rhonchi, no wheezing Abdomen:Morbidly obese, nontender, positive bowel sounds Musculoskeletal:Edema Neuro:Awake and oriented x3, has debility with generalized weakness more in the lower extremities  Psych:Stable Skin:Multiple pressure injuries, sacrococcygeal pressure injury   Data Reviewed: I have personally reviewed following labs and imaging studies  CBC: Recent Labs  Lab 01/15/20 0729  WBC 6.0  HGB 10.4*  HCT 32.7*  MCV 94.8  PLT 113*    Basic Metabolic Panel: Recent Labs  Lab 01/11/20 0412 01/15/20 0729 01/16/20 0440  NA  --  142  --   K 3.7 2.4* 4.1  CL  --  115*  --   CO2  --  15*   --   GLUCOSE  --  88  --   BUN  --  8  --   CREATININE  --  0.57  --   CALCIUM  --  8.2*  --     GFR: CrCl cannot be calculated (Unknown ideal weight.).  Liver Function Tests: No results for input(s): AST, ALT, ALKPHOS, BILITOT, PROT, ALBUMIN in the last 168 hours.  CBG: No results for input(s): GLUCAP in the last 168 hours.   No results found for this or any previous visit (from the past 240 hour(s)).    Radiology Studies: CT THORACIC SPINE W CONTRAST  Result Date: 01/16/2020 CLINICAL DATA:  Mid back pain and osteomyelitis EXAM: CT THORACIC SPINE WITH CONTRAST TECHNIQUE: Multidetector CT images of thoracic was performed according to the standard protocol following intravenous contrast administration. CONTRAST:  OMNIPAQUE IOHEXOL 300 MG/ML  SOLN COMPARISON:  None. FINDINGS: Alignment: Exaggerated kyphosis at the levels of disc collapse in the midthoracic spine. Vertebrae: Endplate sclerosis and destruction at T6-7 and especially T7-8, with superior endplate depression greatest at T8. These findings correlate with notes reporting known discitis osteomyelitis at these levels on outside imaging. Paravertebral fat stranding is likely present but no visible paravertebral abscess. Endplate sclerosis and irregularity at T1-2 without adjacent fat stranding, which could be from prior inflammation or degenerative. No acute fracture. No focal bone lesion. Paraspinal and other soft tissues: As above. There also trace pleural effusions and mild atelectasis. The heart is enlarged. Disc levels: Aside from the discitis findings there is generalized disc narrowing and endplate degeneration with ventral endplate spurring. Degenerative facet spurring in the mid to lower thoracic spine without focal or advanced level. A small calcified disc protrusion is seen left paracentral at T7-8 and right paracentral at T3-4. No visible cord compression. IMPRESSION: 1. T6-7 and T7-8 discitis/osteomyelitis which is  known per chart. Endplate destruction is advanced and there is especially T8 partial vertebral collapse. No available comparison imaging or reports to determine if there has been progression. There is some paravertebral edema without visible abscess.  2. T1-2 disc disease with endplate irregularity which could be degenerative or from discitis (even more remote). Electronically Signed   By: Marnee SpringJonathon  Watts M.D.   On: 01/16/2020 04:08      Scheduled Meds: Please see MAR    Vonzella NippleAnupama Agnieszka Newhouse, MD   01/17/2020, 4:54 PM

## 2020-01-20 LAB — CBC
HCT: 35.3 % — ABNORMAL LOW (ref 36.0–46.0)
Hemoglobin: 11 g/dL — ABNORMAL LOW (ref 12.0–15.0)
MCH: 29.6 pg (ref 26.0–34.0)
MCHC: 31.2 g/dL (ref 30.0–36.0)
MCV: 95.1 fL (ref 80.0–100.0)
Platelets: 139 10*3/uL — ABNORMAL LOW (ref 150–400)
RBC: 3.71 MIL/uL — ABNORMAL LOW (ref 3.87–5.11)
RDW: 15.9 % — ABNORMAL HIGH (ref 11.5–15.5)
WBC: 5 10*3/uL (ref 4.0–10.5)
nRBC: 0 % (ref 0.0–0.2)

## 2020-01-20 LAB — BASIC METABOLIC PANEL
Anion gap: 10 (ref 5–15)
BUN: 7 mg/dL — ABNORMAL LOW (ref 8–23)
CO2: 18 mmol/L — ABNORMAL LOW (ref 22–32)
Calcium: 8.5 mg/dL — ABNORMAL LOW (ref 8.9–10.3)
Chloride: 112 mmol/L — ABNORMAL HIGH (ref 98–111)
Creatinine, Ser: 0.66 mg/dL (ref 0.44–1.00)
GFR, Estimated: >60 mL/min (ref >60)
Glucose, Bld: 83 mg/dL (ref 70–99)
Potassium: 3 mmol/L — ABNORMAL LOW (ref 3.5–5.1)
Sodium: 140 mmol/L (ref 135–145)

## 2020-01-20 LAB — CK: Total CK: 24 U/L — ABNORMAL LOW (ref 38–234)

## 2020-01-21 NOTE — Consult Note (Signed)
Referring Physician: Dr. Adah Perl is an 69 y.o. female.                       Chief Complaint: Sinus bradycardia  HPI: 69 years old white female has PMH of COVID-19 infection, acute on chronic respiratory failure, obesity, obstructive sleep apnea, pulmonary embolism, paroxysmal atrial fibrillation. She is treated with Eliquis, amiodarone and metoprolol. Currently she has sinus bradycardia at rest. Her heart rate is low to mid 40's bpm.  Past Medical History:  Diagnosis Date  . Acute on chronic respiratory failure with hypoxia (HCC)   . COVID-19 virus infection   . Obstructive sleep apnea   . Paroxysmal atrial fibrillation (HCC)   . Pulmonary embolism and infarction [facility])     Past medical history: Atrial fibrillation Chronic respiratory failure COVID-19 Morbid obesity Obstructive sleep apnea  pulmonary embolism Hypertension Hypothyroid Hyperlipidemia  Past surgical history: Abdominal surgery Back surgery Heart surgery Knee replacement Hernia repair Hysterectomy  Social history: Never smoker No alcohol or drug abuse The histories are not reviewed yet. Please review them in the "History" navigator section and refresh this SmartLink.  No family history on file.  Allergies: Not on File  No medications prior to admission.    Results for orders placed or performed during the hospital encounter of 12/19/19 (from the past 48 hour(s))  Basic metabolic panel     Status: Abnormal   Collection Time: 01/20/20  6:33 AM  Result Value Ref Range   Sodium 140 135 - 145 mmol/L   Potassium 3.0 (L) 3.5 - 5.1 mmol/L   Chloride 112 (H) 98 - 111 mmol/L   CO2 18 (L) 22 - 32 mmol/L   Glucose, Bld 83 70 - 99 mg/dL    Comment: Glucose reference range applies only to samples taken after fasting for at least 8 hours.   BUN 7 (L) 8 - 23 mg/dL   Creatinine, Ser 5.68 0.44 - 1.00 mg/dL   Calcium 8.5 (L) 8.9 - 10.3 mg/dL   GFR, Estimated >12 >75 mL/min    Comment:  (NOTE) Calculated using the CKD-EPI Creatinine Equation (2021)    Anion gap 10 5 - 15    Comment: Performed at [facility], [address] [place], [place] [address]  CBC     Status: Abnormal   Collection Time: 01/20/20  6:33 AM  Result Value Ref Range   WBC 5.0 4.0 - 10.5 K/uL   RBC 3.71 (L) 3.87 - 5.11 MIL/uL   Hemoglobin 11.0 (L) 12.0 - 15.0 g/dL   HCT 74.9 (L) 44.9 - 67.5 %   MCV 95.1 80.0 - 100.0 fL   MCH 29.6 26.0 - 34.0 pg   MCHC 31.2 30.0 - 36.0 g/dL   RDW 91.6 (H) 38.4 - 66.5 %   Platelets 139 (L) 150 - 400 K/uL   nRBC 0.0 0.0 - 0.2 %    Comment: Performed at [facility], [address] [place], [place] [address]  CK     Status: Abnormal   Collection Time: 01/20/20  6:33 AM  Result Value Ref Range   Total CK 24 (L) 38 - 234 U/L    Comment: Performed at [facility], [address], [place], [place] [address]   No results found.  Review Of Systems As per HPI  P:48, R- 18,  O2 sat 96 % on RA. BP: 130/70 There were no vitals taken for this visit. There  is no height or weight on file to calculate BMI. General appearance: alert, cooperative, appears stated age and no distress Head: Normocephalic, atraumatic. Eyes: Blueel eyes, pink conjunctiva, corneas clear. PERRL, EOM's intact. Neck: No adenopathy, no carotid bruit, no JVD, supple, symmetrical, trachea midline and thyroid not enlarged. Resp: Clear to auscultation bilaterally. Cardio: Regular rate and rhythm, S1, S2 normal, II/VI systolic murmur, no click, rub or gallop GI: Soft, non-tender; bowel sounds normal; no organomegaly. Extremities: Trace edema, cyanosis or clubbing. Skin: Warm and dry.  Neurologic: Alert and oriented X 3, normal strength. Normal coordination.  Assessment/Plan Sinus bradycardia, asymptomatic Acute on chronic respiratory failure MRSA bacteremia Morbid obesity S/P COVID-19 infection  D/C metoprolol Continue amiodarone. Continue levothyroxine.  Time spent: Review  of old records, Lab, x-rays, EKG, other cardiac tests, examination, discussion with patient/nurse and tech over 60 minutes.  Ricki Rodriguez, MD  01/21/2020, 2:14 PM

## 2020-01-24 LAB — POTASSIUM: Potassium: 3.2 mmol/L — ABNORMAL LOW (ref 3.5–5.1)

## 2020-01-24 NOTE — Progress Notes (Signed)
PROGRESS NOTE    Laurie ErieDebra Chavez  ZOX:[id number] DOB: 20-Dec-1950 DOA: 12/19/2019   Brief Narrative:  Laurie Deltonebra Beckeris an 69 y.o.femalewith medical history significant for coronary disease status post PCI x12 last 1 12/2018, paroxysmal atrial fibrillation on Xarelto, chronic hypoxemic respiratory failure, morbid obesity, obstructive sleep apnea, history of pulmonary embolism who was initially hospitalized September 23, 2019 for 10 days for Covid pneumonia and again on 10/10/2019 for atrial fibrillation and seen in the ED on 10/18/2019 with kidney stone. After that [facility] with complaints of chest pain, nausea, vomiting. Patient was also found to be hypotensive and septic shock. She had MRSA bacteremia and was treated with antibiotics. However, patient continued to have persistent MRSA bacteremia therefore she had to be treated with IV daptomycin and ceftaroline also had to be added. Her hospital course was complicated. She developed rapid atrial fibrillation and underwent cardioversion. She also had worsening respiratory failure and had to be transferred to the ICU where she was intubated for hypoxemia/hypercapnia. She apparently became bradycardic after intubation and went into cardiopulmonary arrest. ACLS protocol was followed and she had return of spontaneous circulation after one round of CPR. She was intubated for a few days and was extubated on 12/16/2019. After that she was on BiPAP. She remained encephalopathic after the episode. For her MRSA bacteremia infectious disease was consulted and as mentioned above she was treated with daptomycin but ceftaroline added due to persistent bacteremia. TEE was negative for valve vegetations. She has knee hardware. Per records from outside facility did not receive Rifampin.She had CT done on 11/26/2019 which showed findings concerning for T6-T7, T7-T8,   discitis/osteomyelitis. Her last negative blood cultures at the outside facility was on 12/06/2019.  12/27/2019: We tried to de-escalate her to monotherapy with daptomycin.  However, she started having low-grade fevers with worsening leukocytosis.  Also her BUN and creatinine worsened. 12/31/2019: She is more awake but still encephalopathic.  She had urine cultures from 12/28/2019 which showed Enterobacter cloacae, Klebsiella pneumoniae.  01/11/2020: She is more awake today.   01/17/20: CT thoracic spine showed T6-T7, T7-T8 discitis/osteomyelitis with advanced endplate destruction, T8 partial vertebral collapse. 01/24/2020: She denies having any new weakness or focal deficits at this time.  She is says that her feet just feel heavy.  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 She had recent COVID-19 infection in recovery, she also has history of pulmonary embolism on anticoagulation, chronic hypoventilation secondary to probable obesity hypoventilation syndrome from morbid obesity, obstructive sleep apnea. She was intubated at the outside facility and was treated with broad-spectrum antimicrobials.  She is currently extubated, on O2 nasal canula. Respiratory status appears to be stable at this time.  Received treatment with daptomycin, ceftaroline for MRSA bacteremia.  Chest x-ray previously showed findings concerning for pneumonia for which she was treated with  Levaquin.  -She has dysphagia and high risk for aspiration and recurrent aspiration pneumonia. If her respiratory status worsens consider obtaining respiratory cultures if feasible and repeat chest imaging preferably chest CT to better evaluate.  MRSA bacteremia: Patient had MRSA bacteremia and had sepsis and  shock at the outside facility. Currently shock is resolved. Her last negative blood cultures at the outside facility on 12/06/2019.  Received treatment with dual antibiotic with daptomycin and ceftaroline.This was because she had persistent bacteremia with MRSA all through September 2021 and was not clearing the bacteremia until ceftaroline was added to the daptomycin. She had transesophageal echocardiogram on 10/31/2019 at the outside facility which did not show any evidence of vegetations.   On 12/27/2019 we tried to switch to monotherapy with daptomycin.  However, she started having low-grade fever with worsening leukocytosis. Therefore added back the ceftaroline.  Chest x-ray showed findings concerning for pneumonia, therefore she was treated with Levaquin.  01/17/2020: CT of the thoracic spine done on 01/16/2020 showed T6-T7, T7-T8 discitis/osteomyelitis, advanced endplate destruction, T8 partial vertebral collapse, paravertebral edema without visible abscess.  T1-2 disc disease with endplate irregularity which could be degenerative or from discitis.   -Given these findings extended the daptomycin for 2 more weeks after which she probably will have to be on prolonged antibiotic suppression with doxycycline. Please monitor CBC, CMP while on the antibiotics.  Monitor CK while on the daptomycin.  Fever/leukocytosis: As mentioned above, we attempted to switch to monotherapy with daptomycin.  However, she had fever and leukocytosis. Therefore had to add the ceftaroline back.  Chest x-ray showed findings concerning for pneumonia.  Because of that she was treated with Levaquin.  Currently afebrile  without leukocytosis. Plan as mentioned above.  UTI with Enterobacter, Klebsiella: Urine cultures from 12/28/2019 showed Enterobacter and Klebsiella.  Sensitive to fluoroquinolones.  She was treated with Levaquin which should have covered both for the UTI as well as for the pneumonia.  She denies having any urinary complaints at this time.  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 Unfortunately she has right knee hardware therefore was unable to get an MRI. She also had persistent bacteremia and required dual antibiotic therapy for to finally clear her MRSA bacteremia. Her last blood cultures that were negative was 12/06/2019. She completed dual antibiotic treatment for 6 weeks from the date of last negative blood cultures which ended 01/17/2020.  - CT T-spine done on 01/16/2020 showed T6-T7, T7-T8 discitis/osteomyelitis with endplate destruction, T8 partial vertebral collapse.  Some paravertebral edema without visible abscess.  T1-T2 disc disease with endplate irregularity likely degenerative or from discitis.  Given these findings will extended the daptomycin for another 2 weeks tentatively until 02/01/2020 after which she will have to be on long-term doxycycline for at least 3 to 4 weeks after which she will need repeat CT imaging of her spine to evaluate for resolution. -In the meantime if she starts having any new neurologic symptoms, incontinence or weakness then CT would have to be done sooner rather than later. -Per staff discharge planning being done.  As mentioned above monitor CK while on the daptomycin.  Acute renal insufficiency: Previously noted to have elevated BUN/creatinine.  Now improved. Please monitor BUN/cr closely while on antibiotics.  She is high risk for worsening renal failure.  Avoid nephrotoxic medications.  Sepsis with shock, currently shock resolved: As  mentioned above she is high risk for recurrent sepsis. If she starts having worsening fevers or leukocytosis would recommend to send for pan cultures. If she has diarrhea suggest to check stool for C. Difficile.  Morbid obesity/obstructive sleep apnea: Continue supportive management per the primary team.  Paroxysmal atrial fibrillation: Continue medication  and management per the primary team.  History of pulmonary embolism: She is on chronic anticoagulation with Xarelto.  Recent COVID-19 infection, in recovery: Continue supportive management per the primary team.  Sacrococcygeal pressure injury unspecified stage: Continue local wound care.  Unfortunately due to her debility she is high risk for worsening.  If any worsening, consider consulting surgery for evaluation.  Dysphagia: Improving. Further management of dysphagia per the primary team.  Plan of care discussed at length with the patient.  Her daughter is at the bedside.  I also discussed the plan of care at length with the daughter and answered all of their questions appropriately to the best of my knowledge.   Subjective: She says that her "feet feel heavy". She does not report any new focal deficits.  She is able to urinate well.  Denies having any bowel or bladder incontinence at this time.  Afebrile.  Objective: Vitals: Temperature 98.1, pulse 69, respiratory rate 18, blood pressure 123/65, oxygen saturation 98% on oxygen by nasal cannula.  Bradycardia at rest.  Cardiology following.  Examination: Constitutional:Morbidly obese,  awake, not in any acute distress at this time Head:Atraumatic, normocephalic Eyes: PERLA  ENMT: external earsnormal, lips appears normal,moist oral mucosa Neck:no masses CVS: S1-S2  Respiratory:Decreased breath sounds lower lobes, Occasional rhonchi, no wheezing Abdomen:Morbidly obese, nontender, positive bowel sounds Musculoskeletal:Edema Neuro:Awake and oriented x3, has debility  with generalized weakness, no new focal deficits Psych:Stable Skin:Multiple pressure injuries, sacrococcygeal pressure injury   Data Reviewed: I have personally reviewed following labs and imaging studies  CBC: Recent Labs  Lab 01/20/20 0633  WBC 5.0  HGB 11.0*  HCT 35.3*  MCV 95.1  PLT 139*    Basic Metabolic Panel: Recent Labs  Lab 01/20/20 0633 01/24/20 1026  NA 140  --   K 3.0* 3.2*  CL 112*  --   CO2 18*  --   GLUCOSE 83  --   BUN 7*  --   CREATININE 0.66  --   CALCIUM 8.5*  --     GFR: CrCl cannot be calculated (Unknown ideal weight.).  Liver Function Tests: No results for input(s): AST, ALT, ALKPHOS, BILITOT, PROT, ALBUMIN in the last 168 hours.  CBG: No results for input(s): GLUCAP in the last 168 hours.   Recent Results (from the past 240 hour(s))  SARS CORONAVIRUS 2 (TAT 6-24 HRS) Nasopharyngeal Nasopharyngeal Swab     Status: None   Collection Time: 01/17/20  4:26 PM   Specimen: Nasopharyngeal Swab  Result Value Ref Range Status   SARS Coronavirus 2 NEGATIVE NEGATIVE Final    Comment: (NOTE) SARS-CoV-2 target nucleic acids are NOT DETECTED.  The SARS-CoV-2 RNA is generally detectable in upper and lower respiratory specimens during the acute phase of infection. Negative results do not preclude SARS-CoV-2 infection, do not rule out co-infections with other pathogens, and should not be used as the sole basis for treatment or other patient management decisions. Negative results must be combined with clinical observations, patient history, and epidemiological information. The expected result is Negative.  Fact Sheet for Patients: HairSlick.no  Fact Sheet for Healthcare Providers: quierodirigir.com  This test is not yet approved or cleared by the [place] FDA and  has been authorized for detection and/or diagnosis of SARS-CoV-2 by FDA under an Emergency Use Authorization (EUA). This  EUA will remain  in effect (meaning this test can be used) for the duration of the COVID-19 declaration under Se ction 564(b)(1) of the Act, 21 U.S.C. section 360bbb-3(b)(1), unless  the authorization is terminated or revoked sooner.  Performed at [facility], [address], [place], [place] [address]       Radiology Studies: No results found.    Scheduled Meds: Please see MAR    Vonzella Nipple, MD   01/24/2020, 5:33 PM

## 2020-01-25 LAB — POTASSIUM: Potassium: 4 mmol/L (ref 3.5–5.1)

## 2020-01-28 LAB — SARS CORONAVIRUS 2 (TAT 6-24 HRS): SARS Coronavirus 2: NEGATIVE

## 2020-02-15 ENCOUNTER — Encounter (HOSPITAL_COMMUNITY): Payer: Self-pay | Admitting: Pharmacy Technician

## 2020-02-15 ENCOUNTER — Inpatient Hospital Stay (HOSPITAL_COMMUNITY)
Admission: EM | Admit: 2020-02-15 | Discharge: 2020-02-25 | DRG: 311 | Disposition: A | Discharge status: Skilled Nursing Facility | Payer: Medicare Other | Attending: Cardiovascular Disease | Admitting: Cardiovascular Disease

## 2020-02-15 ENCOUNTER — Other Ambulatory Visit: Payer: Self-pay

## 2020-02-15 DIAGNOSIS — Z9102 Food additives allergy status: Secondary | ICD-10-CM

## 2020-02-15 DIAGNOSIS — E6609 Other obesity due to excess calories: Secondary | ICD-10-CM

## 2020-02-15 DIAGNOSIS — Z79899 Other long term (current) drug therapy: Secondary | ICD-10-CM

## 2020-02-15 DIAGNOSIS — R0789 Other chest pain: Secondary | ICD-10-CM | POA: Diagnosis not present

## 2020-02-15 DIAGNOSIS — Z6837 Body mass index (BMI) 37.0-37.9, adult: Secondary | ICD-10-CM

## 2020-02-15 DIAGNOSIS — Z885 Allergy status to narcotic agent status: Secondary | ICD-10-CM

## 2020-02-15 DIAGNOSIS — Z7901 Long term (current) use of anticoagulants: Secondary | ICD-10-CM

## 2020-02-15 DIAGNOSIS — T50905A Adverse effect of unspecified drugs, medicaments and biological substances, initial encounter: Secondary | ICD-10-CM | POA: Diagnosis not present

## 2020-02-15 DIAGNOSIS — Z86711 Personal history of pulmonary embolism: Secondary | ICD-10-CM

## 2020-02-15 DIAGNOSIS — R630 Anorexia: Secondary | ICD-10-CM | POA: Diagnosis not present

## 2020-02-15 DIAGNOSIS — L89152 Pressure ulcer of sacral region, stage 2: Secondary | ICD-10-CM | POA: Diagnosis present

## 2020-02-15 DIAGNOSIS — Z91041 Radiographic dye allergy status: Secondary | ICD-10-CM

## 2020-02-15 DIAGNOSIS — I2510 Atherosclerotic heart disease of native coronary artery without angina pectoris: Secondary | ICD-10-CM | POA: Diagnosis present

## 2020-02-15 DIAGNOSIS — Z8701 Personal history of pneumonia (recurrent): Secondary | ICD-10-CM

## 2020-02-15 DIAGNOSIS — Z955 Presence of coronary angioplasty implant and graft: Secondary | ICD-10-CM

## 2020-02-15 DIAGNOSIS — Z79890 Hormone replacement therapy (postmenopausal): Secondary | ICD-10-CM

## 2020-02-15 DIAGNOSIS — I252 Old myocardial infarction: Secondary | ICD-10-CM

## 2020-02-15 DIAGNOSIS — M4644 Discitis, unspecified, thoracic region: Secondary | ICD-10-CM | POA: Diagnosis present

## 2020-02-15 DIAGNOSIS — Z8674 Personal history of sudden cardiac arrest: Secondary | ICD-10-CM

## 2020-02-15 DIAGNOSIS — Z887 Allergy status to serum and vaccine status: Secondary | ICD-10-CM

## 2020-02-15 DIAGNOSIS — Z9981 Dependence on supplemental oxygen: Secondary | ICD-10-CM

## 2020-02-15 DIAGNOSIS — U071 COVID-19: Secondary | ICD-10-CM | POA: Diagnosis present

## 2020-02-15 DIAGNOSIS — L8990 Pressure ulcer of unspecified site, unspecified stage: Secondary | ICD-10-CM | POA: Insufficient documentation

## 2020-02-15 DIAGNOSIS — I249 Acute ischemic heart disease, unspecified: Principal | ICD-10-CM | POA: Diagnosis present

## 2020-02-15 DIAGNOSIS — E039 Hypothyroidism, unspecified: Secondary | ICD-10-CM | POA: Diagnosis present

## 2020-02-15 DIAGNOSIS — L299 Pruritus, unspecified: Secondary | ICD-10-CM | POA: Diagnosis not present

## 2020-02-15 DIAGNOSIS — Z91018 Allergy to other foods: Secondary | ICD-10-CM

## 2020-02-15 DIAGNOSIS — R059 Cough, unspecified: Secondary | ICD-10-CM

## 2020-02-15 DIAGNOSIS — E785 Hyperlipidemia, unspecified: Secondary | ICD-10-CM | POA: Diagnosis present

## 2020-02-15 DIAGNOSIS — I480 Paroxysmal atrial fibrillation: Secondary | ICD-10-CM | POA: Diagnosis present

## 2020-02-15 DIAGNOSIS — Z7982 Long term (current) use of aspirin: Secondary | ICD-10-CM

## 2020-02-15 DIAGNOSIS — R079 Chest pain, unspecified: Secondary | ICD-10-CM

## 2020-02-15 DIAGNOSIS — R001 Bradycardia, unspecified: Secondary | ICD-10-CM | POA: Diagnosis present

## 2020-02-15 DIAGNOSIS — I119 Hypertensive heart disease without heart failure: Secondary | ICD-10-CM | POA: Diagnosis present

## 2020-02-15 DIAGNOSIS — R531 Weakness: Secondary | ICD-10-CM | POA: Diagnosis present

## 2020-02-15 DIAGNOSIS — R112 Nausea with vomiting, unspecified: Secondary | ICD-10-CM

## 2020-02-15 DIAGNOSIS — G4733 Obstructive sleep apnea (adult) (pediatric): Secondary | ICD-10-CM | POA: Diagnosis present

## 2020-02-15 DIAGNOSIS — R7989 Other specified abnormal findings of blood chemistry: Secondary | ICD-10-CM | POA: Diagnosis present

## 2020-02-15 LAB — CBC
HCT: 38.6 % (ref 36.0–46.0)
Hemoglobin: 11.8 g/dL — ABNORMAL LOW (ref 12.0–15.0)
MCH: 29.6 pg (ref 26.0–34.0)
MCHC: 30.6 g/dL (ref 30.0–36.0)
MCV: 96.7 fL (ref 80.0–100.0)
Platelets: 137 10*3/uL — ABNORMAL LOW (ref 150–400)
RBC: 3.99 MIL/uL (ref 3.87–5.11)
RDW: 14.7 % (ref 11.5–15.5)
WBC: 3.5 10*3/uL — ABNORMAL LOW (ref 4.0–10.5)
nRBC: 0 % (ref 0.0–0.2)

## 2020-02-15 LAB — BASIC METABOLIC PANEL
Anion gap: 12 (ref 5–15)
BUN: 8 mg/dL (ref 8–23)
CO2: 22 mmol/L (ref 22–32)
Calcium: 8.2 mg/dL — ABNORMAL LOW (ref 8.9–10.3)
Chloride: 103 mmol/L (ref 98–111)
Creatinine, Ser: 0.72 mg/dL (ref 0.44–1.00)
GFR, Estimated: >60 mL/min (ref >60)
Glucose, Bld: 85 mg/dL (ref 70–99)
Potassium: 3.7 mmol/L (ref 3.5–5.1)
Sodium: 137 mmol/L (ref 135–145)

## 2020-02-15 LAB — TROPONIN I (HIGH SENSITIVITY): Troponin I (High Sensitivity): 9 ng/L (ref <18)

## 2020-02-15 NOTE — ED Triage Notes (Signed)
Pt bib ems from [facility]. Reports cp onset today. Given 3 sl nitro with some relief. Pt given 1 sl nitro and 324mg  asa with ems. Hx MI in the past with 12 stents per EMS. VSS.

## 2020-02-16 ENCOUNTER — Emergency Department (HOSPITAL_COMMUNITY): Payer: Medicare Other

## 2020-02-16 ENCOUNTER — Inpatient Hospital Stay (HOSPITAL_COMMUNITY): Payer: Medicare Other

## 2020-02-16 DIAGNOSIS — R7989 Other specified abnormal findings of blood chemistry: Secondary | ICD-10-CM | POA: Diagnosis present

## 2020-02-16 DIAGNOSIS — R001 Bradycardia, unspecified: Secondary | ICD-10-CM | POA: Diagnosis present

## 2020-02-16 DIAGNOSIS — U071 COVID-19: Secondary | ICD-10-CM | POA: Diagnosis present

## 2020-02-16 DIAGNOSIS — Z887 Allergy status to serum and vaccine status: Secondary | ICD-10-CM | POA: Diagnosis not present

## 2020-02-16 DIAGNOSIS — M4644 Discitis, unspecified, thoracic region: Secondary | ICD-10-CM | POA: Diagnosis present

## 2020-02-16 DIAGNOSIS — Z8701 Personal history of pneumonia (recurrent): Secondary | ICD-10-CM | POA: Diagnosis not present

## 2020-02-16 DIAGNOSIS — R0789 Other chest pain: Secondary | ICD-10-CM | POA: Diagnosis present

## 2020-02-16 DIAGNOSIS — T50905A Adverse effect of unspecified drugs, medicaments and biological substances, initial encounter: Secondary | ICD-10-CM | POA: Diagnosis not present

## 2020-02-16 DIAGNOSIS — I249 Acute ischemic heart disease, unspecified: Secondary | ICD-10-CM | POA: Diagnosis present

## 2020-02-16 DIAGNOSIS — Z8674 Personal history of sudden cardiac arrest: Secondary | ICD-10-CM | POA: Diagnosis not present

## 2020-02-16 DIAGNOSIS — I119 Hypertensive heart disease without heart failure: Secondary | ICD-10-CM | POA: Diagnosis present

## 2020-02-16 DIAGNOSIS — I480 Paroxysmal atrial fibrillation: Secondary | ICD-10-CM | POA: Diagnosis present

## 2020-02-16 DIAGNOSIS — R531 Weakness: Secondary | ICD-10-CM | POA: Diagnosis present

## 2020-02-16 DIAGNOSIS — L299 Pruritus, unspecified: Secondary | ICD-10-CM | POA: Diagnosis not present

## 2020-02-16 DIAGNOSIS — I252 Old myocardial infarction: Secondary | ICD-10-CM | POA: Diagnosis not present

## 2020-02-16 DIAGNOSIS — Z955 Presence of coronary angioplasty implant and graft: Secondary | ICD-10-CM | POA: Diagnosis not present

## 2020-02-16 DIAGNOSIS — E039 Hypothyroidism, unspecified: Secondary | ICD-10-CM | POA: Diagnosis present

## 2020-02-16 DIAGNOSIS — G4733 Obstructive sleep apnea (adult) (pediatric): Secondary | ICD-10-CM | POA: Diagnosis present

## 2020-02-16 DIAGNOSIS — E785 Hyperlipidemia, unspecified: Secondary | ICD-10-CM | POA: Diagnosis present

## 2020-02-16 DIAGNOSIS — Z6837 Body mass index (BMI) 37.0-37.9, adult: Secondary | ICD-10-CM | POA: Diagnosis not present

## 2020-02-16 DIAGNOSIS — L89152 Pressure ulcer of sacral region, stage 2: Secondary | ICD-10-CM | POA: Diagnosis present

## 2020-02-16 DIAGNOSIS — Z86711 Personal history of pulmonary embolism: Secondary | ICD-10-CM | POA: Diagnosis not present

## 2020-02-16 DIAGNOSIS — I2510 Atherosclerotic heart disease of native coronary artery without angina pectoris: Secondary | ICD-10-CM | POA: Diagnosis present

## 2020-02-16 DIAGNOSIS — R630 Anorexia: Secondary | ICD-10-CM | POA: Diagnosis not present

## 2020-02-16 LAB — TROPONIN I (HIGH SENSITIVITY): Troponin I (High Sensitivity): 8 ng/L (ref <18)

## 2020-02-16 LAB — RESP PANEL BY RT-PCR (FLU A&B, COVID) ARPGX2
Influenza A by PCR: NEGATIVE
Influenza B by PCR: NEGATIVE
SARS Coronavirus 2 by RT PCR: POSITIVE — AB

## 2020-02-16 LAB — HIV ANTIBODY (ROUTINE TESTING W REFLEX): HIV Screen 4th Generation wRfx: NONREACTIVE

## 2020-02-16 MED ORDER — PANTOPRAZOLE SODIUM 40 MG PO TBEC
40.0000 mg | DELAYED_RELEASE_TABLET | Freq: Every day | ORAL | Status: DC
  Administered 2020-02-16 – 2020-02-25 (×10): 40 mg via ORAL
  Filled 2020-02-16 (×9): qty 1

## 2020-02-16 MED ORDER — ONDANSETRON HCL 4 MG PO TABS
4.0000 mg | ORAL_TABLET | Freq: Four times a day (QID) | ORAL | Status: DC | PRN
  Filled 2020-02-16 (×2): qty 1

## 2020-02-16 MED ORDER — ACETAMINOPHEN 325 MG PO TABS
650.0000 mg | ORAL_TABLET | ORAL | Status: DC | PRN
  Administered 2020-02-24 (×2): 650 mg via ORAL
  Filled 2020-02-16 (×3): qty 2

## 2020-02-16 MED ORDER — CLOTRIMAZOLE 1 % EX CREA
TOPICAL_CREAM | Freq: Two times a day (BID) | CUTANEOUS | Status: DC
  Administered 2020-02-19: 1 via TOPICAL
  Filled 2020-02-16 (×2): qty 15

## 2020-02-16 MED ORDER — OMEGA-3-ACID ETHYL ESTERS 1 G PO CAPS
1.0000 g | ORAL_CAPSULE | Freq: Every day | ORAL | Status: DC
  Filled 2020-02-16 (×4): qty 1

## 2020-02-16 MED ORDER — ASPIRIN EC 81 MG PO TBEC
81.0000 mg | DELAYED_RELEASE_TABLET | Freq: Every day | ORAL | Status: DC
  Administered 2020-02-17 – 2020-02-18 (×2): 81 mg via ORAL
  Filled 2020-02-16 (×4): qty 1

## 2020-02-16 MED ORDER — HYDROCODONE-ACETAMINOPHEN 5-325 MG PO TABS
1.0000 | ORAL_TABLET | Freq: Four times a day (QID) | ORAL | Status: DC | PRN
  Administered 2020-02-17 – 2020-02-22 (×10): 1 via ORAL
  Filled 2020-02-16 (×10): qty 1

## 2020-02-16 MED ORDER — LEVOTHYROXINE SODIUM 75 MCG PO TABS
75.0000 ug | ORAL_TABLET | Freq: Every day | ORAL | Status: DC
  Administered 2020-02-16 – 2020-02-19 (×4): 75 ug via ORAL
  Filled 2020-02-16 (×4): qty 1

## 2020-02-16 MED ORDER — DOXYCYCLINE HYCLATE 100 MG PO TABS
100.0000 mg | ORAL_TABLET | Freq: Two times a day (BID) | ORAL | Status: DC
  Administered 2020-02-16 – 2020-02-25 (×19): 100 mg via ORAL
  Filled 2020-02-16 (×19): qty 1

## 2020-02-16 MED ORDER — BUPROPION HCL 75 MG PO TABS
75.0000 mg | ORAL_TABLET | Freq: Every day | ORAL | Status: DC
  Administered 2020-02-16 – 2020-02-24 (×9): 75 mg via ORAL
  Filled 2020-02-16 (×12): qty 1

## 2020-02-16 MED ORDER — LIDOCAINE 5 % EX PTCH
1.0000 | MEDICATED_PATCH | CUTANEOUS | Status: DC
  Filled 2020-02-16 (×4): qty 1

## 2020-02-16 MED ORDER — ATORVASTATIN CALCIUM 80 MG PO TABS
80.0000 mg | ORAL_TABLET | Freq: Every day | ORAL | Status: DC
  Administered 2020-02-16 – 2020-02-24 (×9): 80 mg via ORAL
  Filled 2020-02-16 (×9): qty 1

## 2020-02-16 MED ORDER — AMIODARONE HCL 200 MG PO TABS
100.0000 mg | ORAL_TABLET | Freq: Every day | ORAL | Status: DC
  Administered 2020-02-16 – 2020-02-17 (×2): 100 mg via ORAL
  Filled 2020-02-16 (×2): qty 1

## 2020-02-16 MED ORDER — GABAPENTIN 100 MG PO CAPS
200.0000 mg | ORAL_CAPSULE | Freq: Every morning | ORAL | Status: DC
  Administered 2020-02-17 – 2020-02-21 (×5): 200 mg via ORAL
  Filled 2020-02-16 (×5): qty 2

## 2020-02-16 MED ORDER — ASCORBIC ACID 500 MG PO TABS
500.0000 mg | ORAL_TABLET | Freq: Every day | ORAL | Status: DC
  Administered 2020-02-16 – 2020-02-25 (×9): 500 mg via ORAL
  Filled 2020-02-16 (×10): qty 1

## 2020-02-16 MED ORDER — NITROGLYCERIN 0.4 MG SL SUBL: 0.4000 mg | SUBLINGUAL_TABLET | SUBLINGUAL | Status: DC | PRN

## 2020-02-16 MED ORDER — ONDANSETRON HCL 4 MG/2ML IJ SOLN
4.0000 mg | Freq: Four times a day (QID) | INTRAMUSCULAR | Status: DC | PRN
  Administered 2020-02-18 – 2020-02-24 (×11): 4 mg via INTRAVENOUS
  Filled 2020-02-16 (×13): qty 2

## 2020-02-16 NOTE — ED Notes (Signed)
Pt placed on bedpan. Small BM noted. Full bed change complete. Pt clean, dry and PurWik placed.

## 2020-02-16 NOTE — ED Provider Notes (Signed)
[facility] EMERGENCY DEPARTMENT Provider Note   CSN: [id number] Arrival date & time: 02/15/20  1618     History CC: Chest pain  Laurie Chavez is a 70 y.o. female w/ hx of CAD s/p multiple stents, obesity, HLD, PE, OSA, Parox A Fib, on Xarelto and amiodarone, also on 81 mg aspirin, presented Emergency Department with substernal chest pain.  She reports gradual onset of substernal chest pressure and nausea that radiated towards her back yesterday afternoon.  She says it feels very similar to her heart attack in 2002.  EMS gave her sublingual nitro and she did achieve rapid relief.  Unfortunately had a very prolonged stay, approximately 15 hours in the waiting room overnight.  On my assessment in the morning, she reports that her pain is gradually diminished and is now a 2 out of 10, not gone completely.  She feels mildly nauseated.  She denies any diaphoresis, shortness of breath.  She presents from [facility], with a list of medications, and has been compliant with all of her medication including blood thinners.  Care Everywhere - Last LHC in 2020 as noted below: Performed by [facility] This result has an attachment that is not available.   Prox RCA lesion is 35% stenosed.   Dist RCA lesion is 35% stenosed.   Ost LAD to Prox LAD lesion is 40% stenosed.   1st Sept lesion is 90% stenosed.   1st Diag lesion is 25% stenosed.   Mid Cx to Dist Cx lesion is 30% stenosed.   Ost Cx to Mid Cxlesion is 15% stenosed.   Ost R RA to Prox R RA lesion is 30% stenosed.   Ost L RA to Prox L RA lesion is 20% stenosed.   Ost R Acc Renal to Prox R Acc Renal lesion is 20% stenosed.   Impression:   1. Patent stents in LAD, left circumflex artery and RCA with  mild-to-moderate disease in the native large vessels.   2. Severe stenosis in a small septal branch.   3. Severe stenosis in a small right ventricular branch.   4. Mild to moderate renal artery stenosis.     HPI     Past Medical History:  Diagnosis Date  . Acute on chronic respiratory failure with hypoxia (HCC)   . COVID-19 virus infection   . Obstructive sleep apnea   . Paroxysmal atrial fibrillation (HCC)   . Pulmonary embolism and infarction [facility])     Patient Active Problem List   Diagnosis Date Noted  . Acute coronary syndrome (HCC) 02/16/2020  . Acute on chronic respiratory failure with hypoxia (HCC)   . Paroxysmal atrial fibrillation (HCC)   . Obstructive sleep apnea   . Pulmonary embolism and infarction (HCC)   . COVID-19 virus infection     History reviewed. No pertinent surgical history.   OB History   No obstetric history on file.     No family history on file.     Home Medications Prior to Admission medications   Medication Sig Start Date End Date Taking? Authorizing Provider  Amino Acids-Protein Hydrolys (FEEDING SUPPLEMENT, PRO-STAT SUGAR FREE 64,) LIQD Take 30 mLs by mouth 3 (three) times daily.   Yes [provider]  amiodarone (PACERONE) 100 MG tablet Take 100 mg by mouth daily.   Yes [provider]  apixaban (ELIQUIS) 5 MG TABS tablet Take 5 mg by mouth 2 (two) times daily.   Yes [provider]  ascorbic acid (VITAMIN C) 500  MG tablet Take 500 mg by mouth daily.   Yes [provider]  aspirin EC 81 MG tablet Take 81 mg by mouth daily. Swallow whole.   Yes [provider]  atorvastatin (LIPITOR) 80 MG tablet Take 80 mg by mouth at bedtime.   Yes [provider]  buPROPion (WELLBUTRIN) 75 MG tablet Take 75 mg by mouth daily.   Yes [provider]  clotrimazole-betamethasone (LOTRISONE) cream Apply 1 application topically See admin instructions. Apply topically to excoriated areas on buttocks and right lower back daily   Yes [provider]  doxycycline (VIBRA-TABS) 100 MG tablet Take 100 mg by mouth 2 (two) times daily.   Yes [provider]  Ensure (ENSURE) Take 237 mLs  by mouth 3 (three) times daily.   Yes [provider]  gabapentin (NEURONTIN) 100 MG capsule Take 200 mg by mouth See admin instructions. Take two capsules (200 mg) by mouth every morning at 8am   Yes [provider]  gabapentin (NEURONTIN) 300 MG capsule Take 300 mg by mouth See admin instructions. Take one capsule (300 mg) by mouth three times daily - 9am, 2pm, 9pm (also take 200 mg at 8am)   Yes [provider]  HYDROcodone-acetaminophen (NORCO/VICODIN) 5-325 MG tablet Take 1 tablet by mouth See admin instructions. Take one tablet by mouth every morning and at bedtime, may also take one tablet every 6 hours as needed for pain   Yes [provider]  levothyroxine (SYNTHROID) 75 MCG tablet Take 75 mcg by mouth daily at 6 (six) AM.   Yes [provider]  lidocaine (LIDODERM) 5 % Place 1 patch onto the skin daily. Apply to lower back. Remove & Discard patch within 12 hours or as directed by MD   Yes [provider]  melatonin 3 MG TABS tablet Take 3 mg by mouth at bedtime.   Yes [provider]  Omega-3 Fatty Acids (FISH OIL) 1000 MG CAPS Take 1,000 mg by mouth daily.   Yes [provider]  ondansetron (ZOFRAN) 4 MG tablet Take 4 mg by mouth every 6 (six) hours as needed for nausea or vomiting.   Yes [provider]  OXYGEN Inhale 2 L into the lungs continuous.   Yes [provider]  pantoprazole (PROTONIX) 40 MG tablet Take 40 mg by mouth daily at 6 (six) AM.   Yes [provider]  PRESCRIPTION MEDICATION Inhale into the lungs at bedtime. BI-PAP   Yes [provider]  Probiotic Product (DIGESTIVE ADVANTAGE) CAPS Take 1 capsule by mouth daily.   Yes [provider]  zinc sulfate 220 (50 Zn) MG capsule Take 220 mg by mouth daily.   Yes [provider]    Allergies    Black walnut pollen allergy skin test, Iodinated diagnostic agents, Zoster vac recomb adjuvanted, Red dye, and  Tramadol  Review of Systems   Review of Systems  Constitutional: Negative for chills and fever.  HENT: Negative for ear pain and sore throat.   Eyes: Negative for pain and visual disturbance.  Respiratory: Positive for shortness of breath. Negative for cough.   Cardiovascular: Positive for chest pain. Negative for palpitations.  Gastrointestinal: Negative for abdominal pain and vomiting.  Genitourinary: Negative for dysuria and hematuria.  Musculoskeletal: Negative for arthralgias and back pain.  Skin: Negative for color change and rash.  Neurological: Negative for syncope and light-headedness.  All other systems reviewed and are negative.   Physical Exam Updated Vital Signs BP Marland Kitchen(!)  143/83 (BP Location: Right Arm)   Pulse 88   Temp 98.2 F (36.8 C) (Oral)   Resp 18   Ht 5' (1.524 m)   Wt 84.4 kg   SpO2 98%   BMI 36.33 kg/m   Physical Exam Constitutional:      General: She is not in acute distress.    Appearance: She is obese.  HENT:     Head: Normocephalic and atraumatic.  Eyes:     Conjunctiva/sclera: Conjunctivae normal.     Pupils: Pupils are equal, round, and reactive to light.  Cardiovascular:     Rate and Rhythm: Normal rate and regular rhythm.     Pulses: Normal pulses.  Pulmonary:     Effort: Pulmonary effort is normal. No respiratory distress.  Abdominal:     General: There is no distension.     Tenderness: There is no abdominal tenderness.  Skin:    General: Skin is warm and dry.  Neurological:     General: No focal deficit present.     Mental Status: She is alert. Mental status is at baseline.  Psychiatric:        Mood and Affect: Mood normal.        Behavior: Behavior normal.     ED Results / Procedures / Treatments   Labs (all labs ordered are listed, but only abnormal results are displayed) Labs Reviewed  RESP PANEL BY RT-PCR (FLU A&B, COVID) ARPGX2 - Abnormal; Notable for the following components:      Result Value   SARS Coronavirus 2 by  RT PCR POSITIVE (*)    All other components within normal limits  BASIC METABOLIC PANEL - Abnormal; Notable for the following components:   Calcium 8.2 (*)    All other components within normal limits  CBC - Abnormal; Notable for the following components:   WBC 3.5 (*)    Hemoglobin 11.8 (*)    Platelets 137 (*)    All other components within normal limits  HIV ANTIBODY (ROUTINE TESTING W REFLEX)  TROPONIN I (HIGH SENSITIVITY)  TROPONIN I (HIGH SENSITIVITY)    EKG EKG Interpretation  Date/Time:  Saturday February 16 2020 09:24:34 EST Ventricular Rate:  75 PR Interval:  146 QRS Duration: 94 QT Interval:  407 QTC Calculation: 455 R Axis:   -53 Text Interpretation: Sinus rhythm Left anterior fascicular block Abnormal R-wave progression, late transition Borderline T wave abnormalities No STEMI Confirmed by Alvester Chou [phone]) on 02/16/2020 9:30:49 AM   Radiology DG Chest Portable 1 View  Result Date: 02/16/2020 CLINICAL DATA:  Chest pain beginning yesterday. Previous myocardial infarct. EXAM: PORTABLE CHEST 1 VIEW COMPARISON:  12/27/2019 FINDINGS: Stable mild cardiomegaly. Mild scarring again seen in both lower lungs. No evidence of acute infiltrate or edema. No evidence of pleural effusion. IMPRESSION: Mild cardiomegaly and bibasilar scarring.  No active lung disease. Electronically Signed   By: Danae Orleans M.D.   On: 02/16/2020 08:14    Procedures Procedures (including critical care time)  Medications Ordered in ED Medications  aspirin EC tablet 81 mg (has no administration in time range)  nitroGLYCERIN (NITROSTAT) SL tablet 0.4 mg (has no administration in time range)  acetaminophen (TYLENOL) tablet 650 mg (has no administration in time range)  ondansetron (ZOFRAN) injection 4 mg (has no administration in time range)  amiodarone (PACERONE) tablet 100 mg (0 mg Oral Hold 02/16/20 1519)  ascorbic acid (VITAMIN C) tablet 500 mg (500 mg Oral Given 02/16/20 1514)  atorvastatin  (LIPITOR) tablet 80  mg (has no administration in time range)  buPROPion [facility]) tablet 75 mg (75 mg Oral Given 02/16/20 1538)  clotrimazole (LOTRIMIN) 1 % cream ( Topical Not Given 02/16/20 1539)  doxycycline (VIBRA-TABS) tablet 100 mg (100 mg Oral Given 02/16/20 1514)  gabapentin (NEURONTIN) capsule 200 mg (200 mg Oral Not Given 02/16/20 1524)  HYDROcodone-acetaminophen (NORCO/VICODIN) 5-325 MG per tablet 1 tablet (has no administration in time range)  levothyroxine (SYNTHROID) tablet 75 mcg (75 mcg Oral Given 02/16/20 1513)  lidocaine (LIDODERM) 5 % 1 patch (1 patch Transdermal Not Given 02/16/20 1523)  omega-3 acid ethyl esters (LOVAZA) capsule 1 g (1 g Oral Not Given 02/16/20 1524)  ondansetron (ZOFRAN) tablet 4 mg (has no administration in time range)  pantoprazole (PROTONIX) EC tablet 40 mg (40 mg Oral Given 02/16/20 1513)    ED Course  I have reviewed the triage vital signs and the nursing notes.  Pertinent labs & imaging results that were available during my care of the patient were reviewed by me and considered in my medical decision making (see chart for details).  This patient presents to the Emergency Department with complaint of chest pain. This involves an extensive number of treatment options, and is a complaint that carries with it a high risk of complications and morbidity.  The differential diagnosis includes ACS vs Pneumothorax vs PE vs Reflux/Gastritis vs MSK pain vs Pneumonia vs other.  High risk ACS patient per her history and risk factors.  Less likely PE with no hypoxia or respiratory symptoms.  She's compliant with her medications including xarelto.  Unfortunately the patient had a prolonged wait overnight, approx 15 hours in the waiting room, due to critical capacity issues related to the pandemic, prior to being evaluated by myself in an ED bed.  On my morning assessment her pain was minimal, "Almost gone," and her repeat ECG remained nonischemic with NSR.  I ordered,  reviewed, and interpreted labs.  BMP and CBC largely unremarkable.  Initial trop 9.  Repeat pending I ordered imaging studies which included dg chest I independently visualized and interpreted imaging which showed cardiomegaly, no other acute focal findings, and the monitor tracing which showed NSR Previous records obtained and reviewed showing outpatient LHC I personally reviewed the patients ECG which showed sinus rhythm with no acute ischemic findings  I consulted cardiology and discussed lab and imaging findings    Clinical Course as of 02/16/20 1608  Sat Feb 16, 2020  0930 Repeat ECG stable, no ST elevations.  Dr Roseanne Kaufman office paged, as he saw her in consultation in the hospital in December 2021. [MT]  0930  IMPRESSION: Mild cardiomegaly and bibasilar scarring. No active lung disease. [MT]  1275 Dr Algie Coffer called back - will come consult on patient [MT]  1042 Repeat trop 8 , flat [MT]  1119 Admitted by Dr Algie Coffer [MT]    Clinical Course User Index [MT] Renaye Rakers Kermit Balo, MD    Final Clinical Impression(s) / ED Diagnoses Final diagnoses:  Chest pain, unspecified type    Rx / DC Orders ED Discharge Orders    None       Terald Sleeper, MD 02/16/20 [phone]

## 2020-02-16 NOTE — Progress Notes (Signed)
*  PRELIMINARY RESULTS* Echocardiogram 2D Echocardiogram has been performed.  Laurie Chavez 02/16/2020, 12:25 PM

## 2020-02-16 NOTE — ED Notes (Signed)
Pt was unable to swallow vitC without spitting up. Pt reports having problem swallowing large pills. She states that she is also unable to take them crushed in applesauce without vomiting.

## 2020-02-16 NOTE — H&P (Signed)
Referring Physician: Carlean Jews, MD  Laurie Chavez is an 70 y.o. female.                       Chief Complaint: Chest pain  HPI: 70 years old white female with PMH of CAD with multiple stents, HLD, PE, OSA, Paroxysmal atrial fibrillation, S/P COVID infection, morbid obesity and bilateral lower leg weakness has substernal chest pressure radiating to back. Chest pain is almost relieved with 3 SL NTG. She denies fever, cough, sweating spells and shortness of breath.  She is on Xarelto and amiodarone for her atrial fibrillation. EKG is sinus rhythm and poor r wave progression. Chest x-ray shows cardiomegaly and bibasilar scarring. HS-Troponin I level is normal x 2. Last LHC in 2020 showed patent stents in LAD, LCX and RCA  Past Medical History:  Diagnosis Date  . Acute on chronic respiratory failure with hypoxia (HCC)   . COVID-19 virus infection   . Obstructive sleep apnea   . Paroxysmal atrial fibrillation (HCC)   . Pulmonary embolism and infarction [facility])       History reviewed. No pertinent surgical history.  No family history on file. Social History:  has no history on file for tobacco use, alcohol use, and drug use.  Allergies:  Allergies  Allergen Reactions  . Black Walnut Pollen Allergy Skin Test Swelling    Walnuts cause tongue swelling  . Iodinated Diagnostic Agents Anaphylaxis  . Zoster Vac Recomb Adjuvanted Swelling    Facial swelling  . Red Dye Swelling    Body swelling  . Tramadol Nausea And Vomiting    (Not in a hospital admission)   Results for orders placed or performed during the hospital encounter of 02/15/20 (from the past 48 hour(s))  Basic metabolic panel     Status: Abnormal   Collection Time: 02/15/20  4:25 PM  Result Value Ref Range   Sodium 137 135 - 145 mmol/L   Potassium 3.7 3.5 - 5.1 mmol/L   Chloride 103 98 - 111 mmol/L   CO2 22 22 - 32 mmol/L   Glucose, Bld 85 70 - 99 mg/dL    Comment: Glucose reference range applies only to samples taken  after fasting for at least 8 hours.   BUN 8 8 - 23 mg/dL   Creatinine, Ser 7.37 0.44 - 1.00 mg/dL   Calcium 8.2 (L) 8.9 - 10.3 mg/dL   GFR, Estimated >10 >62 mL/min    Comment: (NOTE) Calculated using the CKD-EPI Creatinine Equation (2021)    Anion gap 12 5 - 15    Comment: Performed at [facility], [address] [place], [place] [address]  CBC     Status: Abnormal   Collection Time: 02/15/20  4:25 PM  Result Value Ref Range   WBC 3.5 (L) 4.0 - 10.5 K/uL   RBC 3.99 3.87 - 5.11 MIL/uL   Hemoglobin 11.8 (L) 12.0 - 15.0 g/dL   HCT 46.2 70.3 - 50.0 %   MCV 96.7 80.0 - 100.0 fL   MCH 29.6 26.0 - 34.0 pg   MCHC 30.6 30.0 - 36.0 g/dL   RDW 93.8 18.2 - 99.3 %   Platelets 137 (L) 150 - 400 K/uL   nRBC 0.0 0.0 - 0.2 %    Comment: Performed at [facility], [address], [place], [place] [address]  Troponin I (High Sensitivity)     Status: None   Collection Time: 02/15/20  4:25 PM  Result  Value Ref Range   Troponin I (High Sensitivity) 9 <18 ng/L    Comment: (NOTE) Elevated high sensitivity troponin I (hsTnI) values and significant  changes across serial measurements may suggest ACS but many other  chronic and acute conditions are known to elevate hsTnI results.  Refer to the "Links" section for chest pain algorithms and additional  guidance. Performed at [facility], [address], [place], [place] [address]   Troponin I (High Sensitivity)     Status: None   Collection Time: 02/16/20  9:30 AM  Result Value Ref Range   Troponin I (High Sensitivity) 8 <18 ng/L    Comment: (NOTE) Elevated high sensitivity troponin I (hsTnI) values and significant  changes across serial measurements may suggest ACS but many other  chronic and acute conditions are known to elevate hsTnI results.  Refer to the "Links" section for chest pain algorithms and additional  guidance. Performed at [facility], [address] [place], [place] [address]    DG Chest Portable 1  View  Result Date: 02/16/2020 CLINICAL DATA:  Chest pain beginning yesterday. Previous myocardial infarct. EXAM: PORTABLE CHEST 1 VIEW COMPARISON:  12/27/2019 FINDINGS: Stable mild cardiomegaly. Mild scarring again seen in both lower lungs. No evidence of acute infiltrate or edema. No evidence of pleural effusion. IMPRESSION: Mild cardiomegaly and bibasilar scarring.  No active lung disease. Electronically Signed   By: Danae Orleans M.D.   On: 02/16/2020 08:14    Review Of Systems Constitutional: No fever, chills, chronic weight gain. Eyes: No vision change, wears glasses. No discharge or pain. Ears: No hearing loss, No tinnitus. Respiratory: No asthma, COPD, positive pneumonias. Positive shortness of breath. No hemoptysis. Cardiovascular: Positive chest pain, palpitation, leg edema. Gastrointestinal: Positive nausea, vomiting, no diarrhea, constipation. No GI bleed. No hepatitis. Genitourinary: No dysuria, hematuria, kidney stone. No incontinance. Neurological: No headache, stroke, seizures.  Psychiatry: No psych facility admission for anxiety, depression, suicide. No detox. Skin: No rash. Musculoskeletal: Positive joint pain, fibromyalgia. No neck pain, back pain. Lymphadenopathy: No lymphadenopathy. Hematology: No anemia or easy bruising.   Blood pressure (!) 147/88, pulse 79, temperature 98.5 F (36.9 C), temperature source Oral, resp. rate 18, height 5' (1.524 m), weight 84.4 kg, SpO2 97 %. Body mass index is 36.33 kg/m. General appearance: alert, cooperative, appears stated age and no distress Head: Normocephalic, atraumatic. Eyes: Blue eyes, pink conjunctiva, corneas clear.  Neck: No adenopathy, no carotid bruit, no JVD, supple, symmetrical, trachea midline and thyroid not enlarged. Resp: Clear to auscultation bilaterally. Cardio: Regular rate and rhythm, S1, S2 normal, II/VI systolic murmur, no click, rub or gallop GI: Soft, non-tender; bowel sounds normal; no  organomegaly. Extremities: Trace edema, no cyanosis or clubbing. Skin: Warm and dry.  Neurologic: Alert and oriented X 3, normal strength upper extremities.   Assessment/Plan Acute coronary syndrome Multivessel CAD S/P multiple coronary stents Paroxysmal atrial fibrillation Morbid obesity S/P PE S/P COVID infection  Consider nuclear stress test v/s cardiac catheterization v/s medical treatment. Hold Xarelto for now.  Time spent: Review of old records, Lab, x-rays, EKG, other cardiac tests, examination, discussion with patient/Nurse/ER Physician over 70 minutes.  Ricki Rodriguez, MD  02/16/2020, 11:01 AM

## 2020-02-17 ENCOUNTER — Inpatient Hospital Stay (HOSPITAL_COMMUNITY): Payer: Medicare Other

## 2020-02-17 LAB — CBC
HCT: 37 % (ref 36.0–46.0)
Hemoglobin: 12.2 g/dL (ref 12.0–15.0)
MCH: 31 pg (ref 26.0–34.0)
MCHC: 33 g/dL (ref 30.0–36.0)
MCV: 93.9 fL (ref 80.0–100.0)
Platelets: 116 10*3/uL — ABNORMAL LOW (ref 150–400)
RBC: 3.94 MIL/uL (ref 3.87–5.11)
RDW: 14.6 % (ref 11.5–15.5)
WBC: 3.4 10*3/uL — ABNORMAL LOW (ref 4.0–10.5)
nRBC: 0 % (ref 0.0–0.2)

## 2020-02-17 LAB — BASIC METABOLIC PANEL
Anion gap: 12 (ref 5–15)
BUN: 12 mg/dL (ref 8–23)
CO2: 24 mmol/L (ref 22–32)
Calcium: 8.4 mg/dL — ABNORMAL LOW (ref 8.9–10.3)
Chloride: 102 mmol/L (ref 98–111)
Creatinine, Ser: 0.69 mg/dL (ref 0.44–1.00)
GFR, Estimated: >60 mL/min (ref >60)
Glucose, Bld: 70 mg/dL (ref 70–99)
Potassium: 3.7 mmol/L (ref 3.5–5.1)
Sodium: 138 mmol/L (ref 135–145)

## 2020-02-17 LAB — LIPID PANEL
Cholesterol: 90 mg/dL (ref 0–200)
HDL: 39 mg/dL — ABNORMAL LOW (ref >40)
LDL Cholesterol: 34 mg/dL (ref 0–99)
Total CHOL/HDL Ratio: 2.3 RATIO
Triglycerides: 85 mg/dL (ref <150)
VLDL: 17 mg/dL (ref 0–40)

## 2020-02-17 LAB — ECHOCARDIOGRAM COMPLETE
Area-P 1/2: 3.91 cm2
Height: 60 in
S' Lateral: 2.7 cm
Weight: 2976 oz

## 2020-02-17 LAB — C-REACTIVE PROTEIN: CRP: 0.5 mg/dL (ref <1.0)

## 2020-02-17 LAB — SARS CORONAVIRUS 2 (TAT 6-24 HRS): SARS Coronavirus 2: POSITIVE — AB

## 2020-02-17 LAB — PROTIME-INR
INR: 1.1 (ref 0.8–1.2)
Prothrombin Time: 13.9 seconds (ref 11.4–15.2)

## 2020-02-17 LAB — TSH: TSH: 4.736 u[IU]/mL — ABNORMAL HIGH (ref 0.350–4.500)

## 2020-02-17 LAB — D-DIMER, QUANTITATIVE: D-Dimer, Quant: 0.28 ug/mL-FEU (ref 0.00–0.50)

## 2020-02-17 MED ORDER — METOPROLOL TARTRATE 12.5 MG HALF TABLET
12.5000 mg | ORAL_TABLET | Freq: Two times a day (BID) | ORAL | Status: DC
  Administered 2020-02-17 – 2020-02-25 (×16): 12.5 mg via ORAL
  Filled 2020-02-17 (×17): qty 1

## 2020-02-17 MED ORDER — DILTIAZEM HCL 60 MG PO TABS
30.0000 mg | ORAL_TABLET | Freq: Two times a day (BID) | ORAL | Status: DC
  Administered 2020-02-17 – 2020-02-19 (×4): 30 mg via ORAL
  Filled 2020-02-17 (×6): qty 1

## 2020-02-17 MED ORDER — APIXABAN 5 MG PO TABS
5.0000 mg | ORAL_TABLET | Freq: Two times a day (BID) | ORAL | Status: DC
Start: 2020-02-17 — End: 2020-02-25
  Administered 2020-02-17 – 2020-02-25 (×17): 5 mg via ORAL
  Filled 2020-02-17 (×17): qty 1

## 2020-02-17 MED ORDER — NITROGLYCERIN 0.4 MG/HR TD PT24
0.4000 mg | MEDICATED_PATCH | Freq: Every day | TRANSDERMAL | Status: DC
  Administered 2020-02-17 – 2020-02-25 (×8): 0.4 mg via TRANSDERMAL
  Filled 2020-02-17 (×9): qty 1

## 2020-02-17 NOTE — Progress Notes (Signed)
Ref: Patient, No Pcp Per   Subjective:  No chest pain today. Afebrile. Normal sinus rhythm. Some swallowing problem noted. Some cough present. Patient attributes cough to sinus drainage.   COVID test positive but had COVID pneumonia several weeks ago with negative COVID test 1 month ago and recently/possibly at rehab facility also.  Objective:  Vital Signs in the last 24 hours: Temp:  [95.4 F (35.2 C)-98.2 F (36.8 C)] 97.9 F (36.6 C) (01/09 0828) Pulse Rate:  [61-88] 86 (01/09 0828) Resp:  [13-20] 15 (01/09 0828) BP: (127-151)/(70-114) 141/82 (01/09 0828) SpO2:  [94 %-100 %] 100 % (01/09 0828)  Physical Exam: BP Readings from Last 1 Encounters:  02/17/20 (!) 141/82     Wt Readings from Last 1 Encounters:  02/15/20 84.4 kg    Weight change:  Body mass index is 36.33 kg/m. HEENT: [place]/AT, Eyes-Blue, Conjunctiva-Pink, Sclera-Non-icteric Neck: No JVD, No bruit, Trachea midline. Lungs:  Basal crackles, Bilateral. Cardiac:  Regular rhythm, normal S1 and S2, no S3. II/VI systolic murmur. Abdomen:  Soft, non-tender. BS present. Extremities:  Trace edema present. No cyanosis. No clubbing. CNS: AxOx3, Cranial nerves grossly intact, moves upper extremities well. Minimal motion of lower extremities.  Skin: Warm and dry.   Intake/Output from previous day: No intake/output data recorded.    Lab Results: BMET    Component Value Date/Time   NA 138 02/17/2020 0357   NA 137 02/15/2020 1625   NA 140 01/20/2020 0633   K 3.7 02/17/2020 0357   K 3.7 02/15/2020 1625   K 4.0 01/25/2020 1451   CL 102 02/17/2020 0357   CL 103 02/15/2020 1625   CL 112 (H) 01/20/2020 0633   CO2 24 02/17/2020 0357   CO2 22 02/15/2020 1625   CO2 18 (L) 01/20/2020 0633   GLUCOSE 70 02/17/2020 0357   GLUCOSE 85 02/15/2020 1625   GLUCOSE 83 01/20/2020 0633   BUN 12 02/17/2020 0357   BUN 8 02/15/2020 1625   BUN 7 (L) 01/20/2020 0633   CREATININE 0.69 02/17/2020 0357   CREATININE 0.72 02/15/2020 1625    CREATININE 0.66 01/20/2020 0633   CALCIUM 8.4 (L) 02/17/2020 0357   CALCIUM 8.2 (L) 02/15/2020 1625   CALCIUM 8.5 (L) 01/20/2020 0633   GFRNONAA >60 02/17/2020 0357   GFRNONAA >60 02/15/2020 1625   GFRNONAA >60 01/20/2020 0633   CBC    Component Value Date/Time   WBC 3.4 (L) 02/17/2020 0357   RBC 3.94 02/17/2020 0357   HGB 12.2 02/17/2020 0357   HCT 37.0 02/17/2020 0357   PLT 116 (L) 02/17/2020 0357   MCV 93.9 02/17/2020 0357   MCH 31.0 02/17/2020 0357   MCHC 33.0 02/17/2020 0357   RDW 14.6 02/17/2020 0357   LYMPHSABS 0.7 12/20/2019 0438   MONOABS 0.5 12/20/2019 0438   EOSABS 0.3 12/20/2019 0438   BASOSABS 0.0 12/20/2019 0438   HEPATIC Function Panel Recent Labs    12/20/19 0438  PROT 5.8*   HEMOGLOBIN A1C No components found for: HGA1C,  MPG CARDIAC ENZYMES Lab Results  Component Value Date   CKTOTAL 24 (L) 01/20/2020   BNP No results for input(s): PROBNP in the last 8760 hours. TSH Recent Labs    12/19/19 1850  TSH 6.025*   CHOLESTEROL Recent Labs    02/17/20 0357  CHOL 90    Scheduled Meds: . amiodarone  100 mg Oral Daily  . ascorbic acid  500 mg Oral Daily  . aspirin EC  81 mg Oral Daily  .  atorvastatin  80 mg Oral QHS  . buPROPion  75 mg Oral Daily  . clotrimazole   Topical BID  . doxycycline  100 mg Oral BID  . gabapentin  200 mg Oral q AM  . levothyroxine  75 mcg Oral Q0600  . lidocaine  1 patch Transdermal Q24H  . nitroGLYCERIN  0.4 mg Transdermal Daily  . omega-3 acid ethyl esters  1 g Oral Daily  . pantoprazole  40 mg Oral Q0600   Continuous Infusions: PRN Meds:.acetaminophen, HYDROcodone-acetaminophen, nitroGLYCERIN, ondansetron (ZOFRAN) IV, ondansetron  Assessment/Plan: Acute coronary syndrome Multivessel CAD S/P multiple coronary stents Paroxysmal atrial fibrillation Morbid obesity Hypothyroidism S/P PE S/P COVID infection Positive SARS 2 test Bilateral lower leg weakness  Start NTG patch 0.4 mg. for chest pain  control. Patient prefers medical therapy for now. Repeat SARS 2 screening per patient request. Repeat portable chest x-ray. Will add small dose B-blocker and Ca channel blocker. Discontinue amiodarone. Recheck TSH.   LOS: 1 day   Time spent including chart review, lab review, examination, discussion with patient/nurse : 30 min   Orpah Cobb  MD  02/17/2020, 10:38 AM

## 2020-02-17 NOTE — ED Notes (Signed)
Report given but room not ready

## 2020-02-17 NOTE — ED Notes (Signed)
Tried to give report twice. Nurse unavailable.

## 2020-02-18 DIAGNOSIS — I249 Acute ischemic heart disease, unspecified: Principal | ICD-10-CM

## 2020-02-18 DIAGNOSIS — E6609 Other obesity due to excess calories: Secondary | ICD-10-CM

## 2020-02-18 LAB — COMPREHENSIVE METABOLIC PANEL
ALT: 70 U/L — ABNORMAL HIGH (ref 0–44)
AST: 98 U/L — ABNORMAL HIGH (ref 15–41)
Albumin: 2.7 g/dL — ABNORMAL LOW (ref 3.5–5.0)
Alkaline Phosphatase: 71 U/L (ref 38–126)
Anion gap: 12 (ref 5–15)
BUN: 13 mg/dL (ref 8–23)
CO2: 24 mmol/L (ref 22–32)
Calcium: 8.5 mg/dL — ABNORMAL LOW (ref 8.9–10.3)
Chloride: 101 mmol/L (ref 98–111)
Creatinine, Ser: 0.74 mg/dL (ref 0.44–1.00)
GFR, Estimated: >60 mL/min (ref >60)
Glucose, Bld: 80 mg/dL (ref 70–99)
Potassium: 4.1 mmol/L (ref 3.5–5.1)
Sodium: 137 mmol/L (ref 135–145)
Total Bilirubin: 0.9 mg/dL (ref 0.3–1.2)
Total Protein: 5.6 g/dL — ABNORMAL LOW (ref 6.5–8.1)

## 2020-02-18 LAB — CBC WITH DIFFERENTIAL/PLATELET
Abs Immature Granulocytes: 0.02 10*3/uL (ref 0.00–0.07)
Basophils Absolute: 0 10*3/uL (ref 0.0–0.1)
Basophils Relative: 1 %
Eosinophils Absolute: 0.3 10*3/uL (ref 0.0–0.5)
Eosinophils Relative: 7 %
HCT: 37.2 % (ref 36.0–46.0)
Hemoglobin: 11.9 g/dL — ABNORMAL LOW (ref 12.0–15.0)
Immature Granulocytes: 1 %
Lymphocytes Relative: 26 %
Lymphs Abs: 1.1 10*3/uL (ref 0.7–4.0)
MCH: 30.2 pg (ref 26.0–34.0)
MCHC: 32 g/dL (ref 30.0–36.0)
MCV: 94.4 fL (ref 80.0–100.0)
Monocytes Absolute: 0.5 10*3/uL (ref 0.1–1.0)
Monocytes Relative: 12 %
Neutro Abs: 2.3 10*3/uL (ref 1.7–7.7)
Neutrophils Relative %: 53 %
Platelets: 133 10*3/uL — ABNORMAL LOW (ref 150–400)
RBC: 3.94 MIL/uL (ref 3.87–5.11)
RDW: 14.6 % (ref 11.5–15.5)
WBC: 4.2 10*3/uL (ref 4.0–10.5)
nRBC: 0 % (ref 0.0–0.2)

## 2020-02-18 LAB — PROCALCITONIN: Procalcitonin: 0.17 ng/mL

## 2020-02-18 LAB — RESP PANEL BY RT-PCR (FLU A&B, COVID) ARPGX2
Influenza A by PCR: NEGATIVE
Influenza B by PCR: NEGATIVE
SARS Coronavirus 2 by RT PCR: POSITIVE — AB

## 2020-02-18 LAB — FIBRINOGEN: Fibrinogen: 317 mg/dL (ref 210–475)

## 2020-02-18 LAB — FERRITIN: Ferritin: 245 ng/mL (ref 11–307)

## 2020-02-18 LAB — LACTATE DEHYDROGENASE: LDH: 225 U/L — ABNORMAL HIGH (ref 98–192)

## 2020-02-18 LAB — D-DIMER, QUANTITATIVE: D-Dimer, Quant: <0.27 ug/mL-FEU (ref 0.00–0.50)

## 2020-02-18 LAB — C-REACTIVE PROTEIN: CRP: <0.5 mg/dL (ref <1.0)

## 2020-02-18 MED ORDER — SODIUM CHLORIDE 0.9 % IV SOLN
200.0000 mg | Freq: Once | INTRAVENOUS | Status: AC
  Administered 2020-02-19: 200 mg via INTRAVENOUS
  Filled 2020-02-18: qty 40

## 2020-02-18 MED ORDER — SODIUM CHLORIDE 0.9 % IV SOLN
100.0000 mg | Freq: Every day | INTRAVENOUS | Status: AC
  Administered 2020-02-19 – 2020-02-20 (×2): 100 mg via INTRAVENOUS
  Filled 2020-02-18: qty 20
  Filled 2020-02-18: qty 100
  Filled 2020-02-18: qty 20

## 2020-02-18 NOTE — Progress Notes (Signed)
Pt. Arrives from ED with no c/o CP. Pt. Has a  GCS-15 with VSS. CCMD notified, CHG bath complete. Orders given and carried out. See PCR for vitals.

## 2020-02-18 NOTE — Progress Notes (Signed)
Pharmacy Antibiotic Note  Laurie Chavez is a 70 y.o. female admitted on 02/15/2020 with CP, incidentally found to be COVID+, only slight congestion/cough. Pharmacy has been consulted for Remdesivir dosing - 3 day course requested per MD.  LFTs elevated - will monitor closely.  Plan: - Remdesivir 200mg  IV x 1 followed by 100 mg IV daily x 2 days - Will monitor LFTs  Height: 5' (152.4 cm) Weight: 86.8 kg (191 lb 5.8 oz) IBW/kg (Calculated) : 45.5  Temp (24hrs), Avg:98.2 F (36.8 C), Min:97.9 F (36.6 C), Max:98.4 F (36.9 C)  Recent Labs  Lab 02/15/20 1625 02/17/20 0357 02/18/20 1202  WBC 3.5* 3.4* 4.2  CREATININE 0.72 0.69 0.74    Estimated Creatinine Clearance: 65 mL/min (by C-G formula based on SCr of 0.74 mg/dL).    Allergies  Allergen Reactions  . Black Walnut Pollen Allergy Skin Test Swelling    Walnuts cause tongue swelling  . Iodinated Diagnostic Agents Anaphylaxis  . Zoster Vac Recomb Adjuvanted Swelling    Facial swelling  . Red Dye Swelling    Body swelling  . Tramadol Nausea And Vomiting    Thank you for allowing pharmacy to be a part of this patient's care.  04/17/20, PharmD, BCPS Clinical Pharmacist Clinical phone for 02/18/2020: [phone] 02/18/2020 7:00 PM   **Pharmacist phone directory can now be found on [email or web address] ([email or web address]).  Listed under [facility].

## 2020-02-18 NOTE — Consult Note (Signed)
Medical Consultation   Laurie Chavez  YWV:[id number]  DOB: May 07, 1950  DOA: 02/15/2020  PCP: Patient, No Pcp Per - from [facility]  Outpatient Specialists: Lurlean Leyden - cardiology   Requesting physician: Algie Coffer  Reason for consultation: Chest pain, now pain free. COVID positive.  Mild cough.  Plan was for cath, but she is not mobile at baseline.  Offered stress test -> cath, opted instead for NTG and this is working.  Med management.  History of Present Illness: Laurie Chavez is an 70 y.o. female with h/o CAD s/p stents; obesity (BMI 37); HLD; PE; afib on Xarelto; and OSA who presented on 1/7 with chest pain.  She was admitted to cardiology for ACS with plan for stress vs. Cath vs. Med management.  She reports prior h/o COVID during her August 2021 admission, and it she said she required intubation.  However, those records are not available in Epic.  There are some records from [facility] from November and December from Dr. Welton Flakes and Macky Lower.  Dr. Raymondo Band most recent note from 12/16 refers to her 10 day hospitalization on 09/23/19 and her subsequent admission at [facility] for MRSA bacteremia and PEA arrest.  She has been in SNF rehab and reports inability to mobilize much.  She developed recurrent left-sided CP that was not exertional (since she doesn't really exert herself) and resolved with NTG patch.  She tested positive for COVID (confirmation also positive).  She reports a cough with post-nasal drainage for about 2 weeks and otherwise no apparent COVID symptoms.    Review of Systems:  ROS As per HPI otherwise 10 point review of systems negative.    Past Medical History: Past Medical History:  Diagnosis Date  . Acute on chronic respiratory failure with hypoxia (HCC)   . COVID-19 virus infection   . Obstructive sleep apnea   . Paroxysmal atrial fibrillation (HCC)   . Pulmonary embolism and infarction [facility])     Past Surgical History: History reviewed.  No pertinent surgical history.   Allergies:   Allergies  Allergen Reactions  . Black Walnut Pollen Allergy Skin Test Swelling    Walnuts cause tongue swelling  . Iodinated Diagnostic Agents Anaphylaxis  . Zoster Vac Recomb Adjuvanted Swelling    Facial swelling  . Red Dye Swelling    Body swelling  . Tramadol Nausea And Vomiting     Social History:  has no history on file for tobacco use, alcohol use, and drug use.   Family History: No family history on file.    Physical Exam: Vitals:   02/18/20 0510 02/18/20 0945 02/18/20 1413 02/18/20 1740  BP: (!) 101/51 116/72 126/68 139/74  Pulse: (!) 52 (!) 59 (!) 56 70  Resp: 14 14 20 19   Temp:  97.9 F (36.6 C) 98.4 F (36.9 C) 98.4 F (36.9 C)  TempSrc:  Oral Oral Oral  SpO2: 99% 98% 99% 98%  Weight:      Height:        Constitutional: Alert and awake, oriented x3, not in any acute distress. Eyes: PERLA, EOMI, irises appear normal, anicteric sclera,  ENMT: external ears and nose appear normal, normal hearing, Lips appear normal, oropharynx mucosa, tongueappear normal  Neck: neck appears normal, no masses, normal ROM, no thyromegaly, no JVD  CVS: S1-S2 clear, no murmur rubs or gallops, no LE edema, normal pedal pulses  Respiratory:  clear to auscultation bilaterally, no wheezing, rales  or rhonchi. Respiratory effort normal. No accessory muscle use.  Coarse periodic cough. Abdomen: soft nontender, nondistended Musculoskeletal: : no cyanosis, clubbing or edema noted bilaterally Neuro: Cranial nerves II-XII intact, strength, sensation, reflexes Psych: judgement and insight appear normal, stable mood and affect, mental status Skin: no rashes or lesions or ulcers, no induration or nodules    Data reviewed:  I have personally reviewed the recent labs and imaging studies  Pertinent Labs:   Cholesterol: 90/39/34/85 Unremarkable BMP WBC 3.4 Platelets 11 INR 1.1 CRP 0.5 D-dimer 0.28 TSH 4.736 COVID  POSITIVE   Inpatient Medications:   Scheduled Meds: . apixaban  5 mg Oral BID  . ascorbic acid  500 mg Oral Daily  . aspirin EC  81 mg Oral Daily  . atorvastatin  80 mg Oral QHS  . buPROPion  75 mg Oral Daily  . clotrimazole   Topical BID  . diltiazem  30 mg Oral Q12H  . doxycycline  100 mg Oral BID  . gabapentin  200 mg Oral q AM  . levothyroxine  75 mcg Oral Q0600  . lidocaine  1 patch Transdermal Q24H  . metoprolol tartrate  12.5 mg Oral BID  . nitroGLYCERIN  0.4 mg Transdermal Daily  . omega-3 acid ethyl esters  1 g Oral Daily  . pantoprazole  40 mg Oral Q0600   Continuous Infusions:   Radiological Exams on Admission: DG Chest Port 1 View  Result Date: 02/17/2020 CLINICAL DATA:  Chest pain EXAM: PORTABLE CHEST 1 VIEW COMPARISON:  February 16, 2020 FINDINGS: The cardiomediastinal silhouette is unchanged and enlarged in contour.Trace fluid in the RIGHT minor fissure, unchanged. No pleural effusion. No pneumothorax. Scattered bibasilar linear opacities, unchanged and consistent with scarring versus atelectasis. Status post cholecystectomy. Multilevel degenerative changes of the thoracic spine. IMPRESSION: Scattered bibasilar linear opacities, unchanged and consistent with scarring versus atelectasis. Electronically Signed   By: Meda Klinefelter MD   On: 02/17/2020 11:31    Impression/Recommendations Principal Problem:   Acute coronary syndrome [facility]) Active Problems:   Paroxysmal atrial fibrillation (HCC)   Obstructive sleep apnea   COVID-19 virus infection   Class 2 obesity due to excess calories with body mass index (BMI) of 37.0 to 37.9 in adult   ACS -Patient with significant prior CAD who presented with left-sided chest pain, improved with NTG. -Dr. Jodelle Green is following and has decided on medical management - the patient prefers not to have another cath or stress test at this time and is not particularly mobile -Further management per Dr. Algie Coffer  COVID -She had an  apparently significant prior infection but appears to have been infected -Her only significant symptom is cough with post-nasal drainage -COVID POSITIVE on repeat testing -Negative CXR -Her labs are reassuring -Since she is > 90 days post-infection, she needs isolation for 10 days.   -Will give remdesivir for 3 days as indicated for mild disease. -Patient was seen wearing full PPE including: gown, gloves, head cover, N95, and face shield; donning and doffing was in compliance with current standards.  HLD -Continue Lipitor, Lovaza  Afib -Previously on Amiodarone, changed to Cardizem and Lopressor per Dr. Algie Coffer -Continue Eliquis  OSA -No CPAP with active COVID  Hypothyroidism -Continue Synthroid at current dose for now  Obesity -Body mass index is 37.37 kg/m..  -Weight loss should be encouraged -Outpatient PCP/bariatric medicine/bariatric surgery f/u encouraged   Thank you for this consultation.  Our [facility] hospitalist team will follow the patient with you.   Time Spent: 50  minutes  Jonah Blue M.D. [facility] 02/18/2020, 6:56 PM

## 2020-02-19 DIAGNOSIS — U071 COVID-19: Secondary | ICD-10-CM

## 2020-02-19 MED ORDER — LEVOTHYROXINE SODIUM 100 MCG PO TABS
100.0000 ug | ORAL_TABLET | Freq: Every day | ORAL | Status: DC
  Administered 2020-02-20 – 2020-02-25 (×6): 100 ug via ORAL
  Filled 2020-02-19 (×5): qty 1

## 2020-02-19 MED ORDER — LEVOTHYROXINE SODIUM 25 MCG PO TABS
25.0000 ug | ORAL_TABLET | Freq: Once | ORAL | Status: AC
  Administered 2020-02-19: 25 ug via ORAL
  Filled 2020-02-19: qty 1

## 2020-02-19 NOTE — Hospital Course (Addendum)
70 year old woman PMH CAD, A. fib admitted by cardiology for ACS.  TRH consulted for COVID-positive status.  Chest x-ray was negative and inflammatory markers were reassuring.  No hypoxia.  Recommend remdesivir for 3 days.  No steroids unless hypoxia develops.  Can discharge anytime, will complete course of remdesivir 11/12, if discharged before that, outpatient therapy can be arranged.  We will continue to follow.  A & P  COVID-positive --Chest x-ray clear, no hypoxia, inflammatory markers reassuring, asymptomatic. --Recommend 3 days remdesivir to decrease risk for progression.  No other therapy indicated at this time.  Completes course 11/12, can be discharged anytime and outpatient therapy arranged. --continue vitamin C and zinc on discharge --isolate until 1/31  ACS, hyperlipidemia, atrial fibrillation --Per primary

## 2020-02-19 NOTE — Progress Notes (Signed)
FOLLOW-up MEDICAL CONSULT PROGRESS NOTE  Alaylah Heatherington QQV:[id number] DOB: [birth year] DOA: 02/15/2020 PCP: Patient, No Pcp Per  Brief History   70 year old woman PMH CAD, A. fib admitted by cardiology for ACS.  TRH consulted for COVID-positive status.  Chest x-ray was negative and inflammatory markers were reassuring.  No hypoxia.  Recommend remdesivir for 3 days.  No steroids unless hypoxia develops.  Can discharge anytime, will complete course of remdesivir 11/12, if discharged before that, outpatient therapy can be arranged.  We will continue to follow.  A & P  COVID-positive --Chest x-ray clear, no hypoxia, inflammatory markers reassuring, asymptomatic. --Recommend 3 days remdesivir to decrease risk for progression.  No other therapy indicated at this time.  Completes course 11/12, can be discharged anytime and outpatient therapy arranged. --continue vitamin C and zinc on discharge --isolate until 1/31  ACS, hyperlipidemia, atrial fibrillation --Per primary    Disposition Plan:  Discussion: as above. I d/w Dr. Algie Coffer. Will follow.  DVT prophylaxis:  apixaban (ELIQUIS) tablet 5 mg    Code Status: Full Code Family Communication: none  Brendia Sacks, MD  [facility] Direct contact: see [email or web address] (further directions at bottom of note if needed) 7PM-7AM contact night coverage as at bottom of note 02/19/2020, 10:45 AM  LOS: 3 days    Interval History/Subjective  CC: f/u COVID  Feels fine, no SOB, no pain, no complaints.  Objective   Vitals:  Vitals:   02/19/20 0600 02/19/20 0955  BP: 100/67 117/60  Pulse:  (!) 56  Resp:  20  Temp: 98.2 F (36.8 C) 98.4 F (36.9 C)  SpO2:  100%    Exam:  Constitutional:   . Appears calm and comfortable ENMT:  . grossly normal hearing  Respiratory:  . CTA bilaterally, no w/r/r.  . Respiratory effort normal. Cardiovascular:  . RRR, no m/r/g . No LE extremity edema   Psychiatric:  . Mental status o Mood, affect  appropriate  I have personally reviewed the following:   Today's Data  . No new labs today.  Reviewed from last 2 days.  Scheduled Meds: . apixaban  5 mg Oral BID  . ascorbic acid  500 mg Oral Daily  . atorvastatin  80 mg Oral QHS  . buPROPion  75 mg Oral Daily  . clotrimazole   Topical BID  . doxycycline  100 mg Oral BID  . gabapentin  200 mg Oral q AM  . [START ON 02/20/2020] levothyroxine  100 mcg Oral Q0600  . levothyroxine  25 mcg Oral Once  . lidocaine  1 patch Transdermal Q24H  . metoprolol tartrate  12.5 mg Oral BID  . nitroGLYCERIN  0.4 mg Transdermal Daily  . pantoprazole  40 mg Oral Q0600   Continuous Infusions: . remdesivir 100 mg in NS 100 mL      Principal Problem:   Acute coronary syndrome (HCC) Active Problems:   Paroxysmal atrial fibrillation (HCC)   Obstructive sleep apnea   COVID-19 virus infection   Class 2 obesity due to excess calories with body mass index (BMI) of 37.0 to 37.9 in adult   LOS: 3 days   How to contact the [facility] Attending or Consulting provider 7A - 7P or covering provider during after hours 7P -7A, for this patient?  1. Check the care team in [facility] and look for a) attending/consulting TRH provider listed and b) the [facility] team listed 2. Log into [email or web address] and use [place]'s universal password to access. If you do not have the  password, please contact the hospital operator. 3. Locate the [facility] provider you are looking for under [facility] and page to a number that you can be directly reached. 4. If you still have difficulty reaching the provider, please page the [facility] (Director on Call) for the Hospitalists listed on amion for assistance.

## 2020-02-19 NOTE — TOC Initial Note (Signed)
Transition of Care [facility]) - Initial/Assessment Note    Patient Details  Name: Laurie Chavez MRN: [id number] Date of Birth: 1950-03-29  [facility]) CM/SW Contact:    Eduard Roux, LCSWA Phone Number: 02/19/2020, 3:20 PM  Clinical Narrative:                  CSW spoke with the patient via phone. CSW introduced self and explained role.  Patient confirmed she is from [facility] and anticipates returning there once she is medically stable. CSW informed per MD notes she could discharge at anytime-patient states she has been sick and she was informed she will be here for at least 5 days.   Patient states no questions at this time.  CSW has confirmed with [facility] she can return when medically stable.  CSW will continue to follow and assist with discharge plan.  Antony Blackbird, MSW, LCSW Clinical Social Worker   Expected Discharge Plan: Skilled Nursing Facility Barriers to Discharge: Continued Medical Work up   Patient Goals and CMS Choice        Expected Discharge Plan and Services Expected Discharge Plan: Skilled Nursing Facility In-house Referral: Clinical Social Work     Living arrangements for the past 2 months: Single Family Home                                      Prior Living Arrangements/Services Living arrangements for the past 2 months: Single Family Home Lives with:: Self Patient language and need for interpreter reviewed:: No        Need for Family Participation in Patient Care: Yes (Comment) Care giver support system in place?: Yes (comment)   Criminal Activity/Legal Involvement Pertinent to Current Situation/Hospitalization: No - Comment as needed  Activities of Daily Living      Permission Sought/Granted Permission sought to share information with : Family Supports Permission granted to share information with : Yes, Verbal Permission Granted  Share Information with NAME: Enrique Sack  Permission granted  to share info w AGENCY: SNF  Permission granted to share info w Relationship: daugter  Permission granted to share info w Contact Information: [phone]  Emotional Assessment       Orientation: : Oriented to Self,Oriented to Place,Oriented to  Time,Oriented to Situation Alcohol / Substance Use: Not Applicable Psych Involvement: No (comment)  Admission diagnosis:  Cough [R05.9] Acute coronary syndrome (HCC) [I24.9] Chest pain, unspecified type [R07.9] Patient Active Problem List   Diagnosis Date Noted  . Class 2 obesity due to excess calories with body mass index (BMI) of 37.0 to 37.9 in adult 02/18/2020  . Acute coronary syndrome (HCC) 02/16/2020  . Acute on chronic respiratory failure with hypoxia (HCC)   . Paroxysmal atrial fibrillation (HCC)   . Obstructive sleep apnea   . Pulmonary embolism and infarction (HCC)   . COVID-19 virus infection    PCP:  Patient, No Pcp Per Pharmacy:  No Pharmacies Listed    Social Determinants of Health (SDOH) Interventions    Readmission Risk Interventions No flowsheet data found.

## 2020-02-19 NOTE — Progress Notes (Signed)
Ref: Patient, No Pcp Per   Subjective:  Awake. Afebrile. Covid positive but mostly asymptomatic. Episodes of sinus bradycardia with metoprolol and diltiazem use. Chest pain controlled with NTG use. H/O small vessel disease on last cardiac cath with patent stents.  Objective:  Vital Signs in the last 24 hours: Temp:  [98.2 F (36.8 C)-98.5 F (36.9 C)] 98.2 F (36.8 C) (01/11 0600) Pulse Rate:  [56-70] 70 (01/11 0119) Cardiac Rhythm: Sinus bradycardia (01/11 0700) Resp:  [18-20] 18 (01/11 0119) BP: (100-139)/(67-82) 100/67 (01/11 0600) SpO2:  [98 %-99 %] 99 % (01/11 0119)  Physical Exam: BP Readings from Last 1 Encounters:  02/19/20 100/67     Wt Readings from Last 1 Encounters:  02/17/20 86.8 kg    Weight change:  Body mass index is 37.37 kg/m. HEENT: [place]/AT, Eyes-Blue, Conjunctiva-Pink, Sclera-Non-icteric Neck: No JVD, No bruit, Trachea midline. Lungs:  Clearing, Bilateral. Cardiac:  Regular rhythm, normal S1 and S2, no S3. II/VI systolic murmur. Abdomen:  Soft, non-tender. BS present. Extremities:  Trace edema present. No cyanosis. No clubbing. CNS: AxOx3, Cranial nerves grossly intact, moves upper extremities and little lower extremities.  Skin: Warm and dry.   Intake/Output from previous day: 01/10 0701 - 01/11 0700 In: 240 [P.O.:240] Out: -     Lab Results: BMET    Component Value Date/Time   NA 137 02/18/2020 1202   NA 138 02/17/2020 0357   NA 137 02/15/2020 1625   K 4.1 02/18/2020 1202   K 3.7 02/17/2020 0357   K 3.7 02/15/2020 1625   CL 101 02/18/2020 1202   CL 102 02/17/2020 0357   CL 103 02/15/2020 1625   CO2 24 02/18/2020 1202   CO2 24 02/17/2020 0357   CO2 22 02/15/2020 1625   GLUCOSE 80 02/18/2020 1202   GLUCOSE 70 02/17/2020 0357   GLUCOSE 85 02/15/2020 1625   BUN 13 02/18/2020 1202   BUN 12 02/17/2020 0357   BUN 8 02/15/2020 1625   CREATININE 0.74 02/18/2020 1202   CREATININE 0.69 02/17/2020 0357   CREATININE 0.72 02/15/2020 1625    CALCIUM 8.5 (L) 02/18/2020 1202   CALCIUM 8.4 (L) 02/17/2020 0357   CALCIUM 8.2 (L) 02/15/2020 1625   GFRNONAA >60 02/18/2020 1202   GFRNONAA >60 02/17/2020 0357   GFRNONAA >60 02/15/2020 1625   CBC    Component Value Date/Time   WBC 4.2 02/18/2020 1202   RBC 3.94 02/18/2020 1202   HGB 11.9 (L) 02/18/2020 1202   HCT 37.2 02/18/2020 1202   PLT 133 (L) 02/18/2020 1202   MCV 94.4 02/18/2020 1202   MCH 30.2 02/18/2020 1202   MCHC 32.0 02/18/2020 1202   RDW 14.6 02/18/2020 1202   LYMPHSABS 1.1 02/18/2020 1202   MONOABS 0.5 02/18/2020 1202   EOSABS 0.3 02/18/2020 1202   BASOSABS 0.0 02/18/2020 1202   HEPATIC Function Panel Recent Labs    12/20/19 0438 02/18/20 1202  PROT 5.8* 5.6*   HEMOGLOBIN A1C No components found for: HGA1C,  MPG CARDIAC ENZYMES Lab Results  Component Value Date   CKTOTAL 24 (L) 01/20/2020   BNP No results for input(s): PROBNP in the last 8760 hours. TSH Recent Labs    12/19/19 1850 02/17/20 1245  TSH 6.025* 4.736*   CHOLESTEROL Recent Labs    02/17/20 0357  CHOL 90    Scheduled Meds: . apixaban  5 mg Oral BID  . ascorbic acid  500 mg Oral Daily  . atorvastatin  80 mg Oral QHS  . buPROPion  75 mg Oral Daily  . clotrimazole   Topical BID  . doxycycline  100 mg Oral BID  . gabapentin  200 mg Oral q AM  . levothyroxine  75 mcg Oral Q0600  . lidocaine  1 patch Transdermal Q24H  . metoprolol tartrate  12.5 mg Oral BID  . nitroGLYCERIN  0.4 mg Transdermal Daily  . pantoprazole  40 mg Oral Q0600   Continuous Infusions: . remdesivir 100 mg in NS 100 mL     PRN Meds:.acetaminophen, HYDROcodone-acetaminophen, nitroGLYCERIN, ondansetron (ZOFRAN) IV, ondansetron  Assessment/Plan: Acute coronary syndrome Multivessel CAD S/P multiple coronary stents Paroxysmal atrial fibrillation Morbid obesity Hypothyroidism S/P PE S/P COVID infection and reinfection Bilateral lower leg weakness  Discontinue diltiazem. Continue small dose  metoprolol. Appreciate Hospitalist consult. Adjust levothyroxine dose.   LOS: 3 days   Time spent including chart review, lab review, examination, discussion with patient/nurse : 30 min   Orpah Cobb  MD  02/19/2020, 10:00 AM

## 2020-02-20 ENCOUNTER — Inpatient Hospital Stay (HOSPITAL_COMMUNITY): Payer: Medicare Other

## 2020-02-20 DIAGNOSIS — I249 Acute ischemic heart disease, unspecified: Secondary | ICD-10-CM | POA: Diagnosis not present

## 2020-02-20 LAB — COMPREHENSIVE METABOLIC PANEL
ALT: 116 U/L — ABNORMAL HIGH (ref 0–44)
AST: 163 U/L — ABNORMAL HIGH (ref 15–41)
Albumin: 2.6 g/dL — ABNORMAL LOW (ref 3.5–5.0)
Alkaline Phosphatase: 74 U/L (ref 38–126)
Anion gap: 13 (ref 5–15)
BUN: 13 mg/dL (ref 8–23)
CO2: 22 mmol/L (ref 22–32)
Calcium: 8.4 mg/dL — ABNORMAL LOW (ref 8.9–10.3)
Chloride: 105 mmol/L (ref 98–111)
Creatinine, Ser: 0.68 mg/dL (ref 0.44–1.00)
GFR, Estimated: >60 mL/min (ref >60)
Glucose, Bld: 81 mg/dL (ref 70–99)
Potassium: 3.9 mmol/L (ref 3.5–5.1)
Sodium: 140 mmol/L (ref 135–145)
Total Bilirubin: 0.9 mg/dL (ref 0.3–1.2)
Total Protein: 5.5 g/dL — ABNORMAL LOW (ref 6.5–8.1)

## 2020-02-20 LAB — MAGNESIUM: Magnesium: 1.4 mg/dL — ABNORMAL LOW (ref 1.7–2.4)

## 2020-02-20 MED ORDER — SIMETHICONE 80 MG PO CHEW
80.0000 mg | CHEWABLE_TABLET | Freq: Four times a day (QID) | ORAL | Status: DC
  Administered 2020-02-20 – 2020-02-21 (×3): 80 mg via ORAL
  Filled 2020-02-20 (×3): qty 1

## 2020-02-20 MED ORDER — SENNOSIDES-DOCUSATE SODIUM 8.6-50 MG PO TABS
1.0000 | ORAL_TABLET | Freq: Two times a day (BID) | ORAL | Status: DC
  Administered 2020-02-20 – 2020-02-21 (×2): 1 via ORAL
  Filled 2020-02-20 (×2): qty 1

## 2020-02-20 MED ORDER — POLYETHYLENE GLYCOL 3350 17 G PO PACK
17.0000 g | PACK | Freq: Every day | ORAL | Status: DC
  Administered 2020-02-21 – 2020-02-23 (×2): 17 g via ORAL
  Filled 2020-02-20 (×4): qty 1

## 2020-02-20 MED ORDER — ZINC OXIDE 40 % EX OINT
TOPICAL_OINTMENT | Freq: Two times a day (BID) | CUTANEOUS | Status: DC
  Administered 2020-02-20 – 2020-02-25 (×4): 1 via TOPICAL
  Filled 2020-02-20: qty 57

## 2020-02-20 MED ORDER — CLOTRIMAZOLE 1 % VA CREA
1.0000 | TOPICAL_CREAM | Freq: Every day | VAGINAL | Status: DC
  Filled 2020-02-20: qty 45

## 2020-02-20 MED ORDER — FLUCONAZOLE 100 MG PO TABS
100.0000 mg | ORAL_TABLET | Freq: Every day | ORAL | Status: DC
  Administered 2020-02-20 – 2020-02-25 (×6): 100 mg via ORAL
  Filled 2020-02-20 (×6): qty 1

## 2020-02-20 MED ORDER — MAGNESIUM SULFATE 2 GM/50ML IV SOLN
2.0000 g | Freq: Once | INTRAVENOUS | Status: AC
  Administered 2020-02-20: 2 g via INTRAVENOUS
  Filled 2020-02-20: qty 50

## 2020-02-20 MED ORDER — ZINC OXIDE 40 % EX OINT
TOPICAL_OINTMENT | Freq: Every day | CUTANEOUS | Status: DC
  Filled 2020-02-20: qty 57

## 2020-02-20 MED ORDER — DEXTROSE-NACL 5-0.45 % IV SOLN: INTRAVENOUS | Status: AC

## 2020-02-20 NOTE — Progress Notes (Signed)
Pt unable to swallow any of her medicine she became very sick and threw up everything.  PRN zofran was given prior to.

## 2020-02-20 NOTE — Progress Notes (Signed)
[facility] Consultation Progress Note  Patient: Laurie Chavez AST:[id number]   PCP: Patient, No Pcp Per DOB: 1950-03-21   DOA: 02/15/2020   DOS: 02/20/2020   Date of Service: the patient was seen and examined on 02/20/2020 Primary service: Orpah Cobb, MD    Brief hospital course: Pt. with PMH of CAD, A. fib; admitted on 02/15/2020, with complaint of chest pain found to have ACS. Hospitalist were consulted for medical management of COVID-19 positive test. Currently further plan is improvement in oral intake.  Subjective: Reports ongoing nausea.  Also reports chronic lower extremity pain.  Also reports groin pain per RN from excoriation.  Minimal oral intake.  Assessment and Plan: 1.Acute COVID-19 Viral infection. CXR: hazy bilateral peripheral opacities Oxygen requirement: Room air CRP: Normal Remdesivir: Completed 3 days of remdesivir therapy Steroids: Not indicated Baricitinib/Actemra: Not indicated Antibiotics: Not indicated DVT Prophylaxis:  apixaban (ELIQUIS) tablet 5 mg  Prone positioning and incentive spirometer use recommended.  Overall plan: Stable from COVID-19 perspective.  Do not think that the nausea has an association with her COVID-19 infection  2.  Intractable nausea and vomiting X-ray abdomen negative. Continue as needed Zofran. As needed Gas-X. Stool regimen.  3. yeast infection of the perineal area as well as pannus. Initiated on Diflucan. Desitin locally. Appreciate wound care.  4. elevated LFT. Likely combination of hemodynamic injury from ACS as well as RVR and medication injury Do not think that there is any association between LFT elevation as well as nausea although we would like to continue to monitor for now.  5. ACS. A. fib with RVR. hyperlipidemia. Medical management suggested by cardiology. Management per cardiology.  6.  Obesity. Placing the patient at high risk for poor outcome from COVID-19 pneumonia. Also pannus infection can  worsen.  Body mass index is 37.37 kg/m.   7. stage II sacral pressure injury Foam dressing.  Recommendation on discharge: Monitor LFT  Diet: Cardiac diet  Family Communication: no family was present at bedside, at the time of interview.   Disposition: We will continue to follow the patient.   Other Consultants: none Procedures: none Antibiotics: Anti-infectives (From admission, onward)   Start     Dose/Rate Route Frequency Ordered Stop   02/20/20 1415  fluconazole (DIFLUCAN) tablet 100 mg        100 mg Oral Daily 02/20/20 1319     02/19/20 1000  remdesivir 100 mg in sodium chloride 0.9 % 100 mL IVPB       "Followed by" Linked Group Details   100 mg 200 mL/hr over 30 Minutes Intravenous Daily 02/18/20 1857 02/20/20 1113   02/18/20 1945  remdesivir 200 mg in sodium chloride 0.9% 250 mL IVPB       "Followed by" Linked Group Details   200 mg 580 mL/hr over 30 Minutes Intravenous Once 02/18/20 1857 02/19/20 0113   02/16/20 1130  doxycycline (VIBRA-TABS) tablet 100 mg        100 mg Oral 2 times daily 02/16/20 1126        Objective: Physical Exam: Vitals:   02/20/20 0439 02/20/20 1003 02/20/20 1327 02/20/20 1540  BP: 132/69 133/68 (!) 151/73 (!) 132/91  Pulse: 64 62 62 67  Resp: 20 18 18 18   Temp: 98.2 F (36.8 C) 97.9 F (36.6 C) 98.5 F (36.9 C) 97.7 F (36.5 C)  TempSrc: Oral Oral Oral Oral  SpO2: 98% 97% 98% 99%  Weight:      Height:        Intake/Output  Summary (Last 24 hours) at 02/20/2020 2050 Last data filed at 02/20/2020 1800 Gross per 24 hour  Intake 127.88 ml  Output 650 ml  Net -522.12 ml   Filed Weights   02/15/20 1626 02/17/20 2005  Weight: 84.4 kg 86.8 kg   General: Appear in moderate distress, no Rash; Oral Mucosa Clear, moist. no Abnormal Neck Mass Or lumps, Conjunctiva normal  Cardiovascular: S1 and S2 Present, no Murmur, Respiratory: good respiratory effort, Bilateral Air entry present and Clear to Auscultation, no Crackles, no  wheezes Abdomen: Bowel Sound present, Soft and no tenderness Extremities: no Pedal edema, no calf tenderness Neurology: alert and oriented to time, place, and person affect appropriate. no new focal deficit Gait not checked due to patient safety concerns   Data Reviewed: CBC: Recent Labs  Lab 02/15/20 1625 02/17/20 0357 02/18/20 1202  WBC 3.5* 3.4* 4.2  NEUTROABS  --   --  2.3  HGB 11.8* 12.2 11.9*  HCT 38.6 37.0 37.2  MCV 96.7 93.9 94.4  PLT 137* 116* 133*   Basic Metabolic Panel: Recent Labs  Lab 02/15/20 1625 02/17/20 0357 02/18/20 1202 02/20/20 0959  NA 137 138 137 140  K 3.7 3.7 4.1 3.9  CL 103 102 101 105  CO2 22 24 24 22   GLUCOSE 85 70 80 81  BUN 8 12 13 13   CREATININE 0.72 0.69 0.74 0.68  CALCIUM 8.2* 8.4* 8.5* 8.4*  MG  --   --   --  1.4*   Liver Function Tests: Recent Labs  Lab 02/18/20 1202 02/20/20 0959  AST 98* 163*  ALT 70* 116*  ALKPHOS 71 74  BILITOT 0.9 0.9  PROT 5.6* 5.5*  ALBUMIN 2.7* 2.6*   No results for input(s): LIPASE, AMYLASE in the last 168 hours. No results for input(s): AMMONIA in the last 168 hours. Cardiac Enzymes: No results for input(s): CKTOTAL, CKMB, CKMBINDEX, TROPONINI in the last 168 hours. BNP (last 3 results) Recent Labs    12/19/19 1850  BNP 248.5*   CBG: No results for input(s): GLUCAP in the last 168 hours. Recent Results (from the past 240 hour(s))  Resp Panel by RT-PCR (Flu A&B, Covid) Nasopharyngeal Swab     Status: Abnormal   Collection Time: 02/16/20  8:05 AM   Specimen: Nasopharyngeal Swab; Nasopharyngeal(NP) swabs in vial transport medium  Result Value Ref Range Status   SARS Coronavirus 2 by RT PCR POSITIVE (A) NEGATIVE Final    Comment: RESULT CALLED TO, READ BACK BY AND VERIFIED WITH: RN LIZ M. [id number] FCP (NOTE) SARS-CoV-2 target nucleic acids are DETECTED.  The SARS-CoV-2 RNA is generally detectable in upper respiratory specimens during the acute phase of infection. Positive results  are indicative of the presence of the identified virus, but do not rule out bacterial infection or co-infection with other pathogens not detected by the test. Clinical correlation with patient history and other diagnostic information is necessary to determine patient infection status. The expected result is Negative.  Fact Sheet for Patients: 04/15/20  Fact Sheet for Healthcare Providers: 05-24-2003  This test is not yet approved or cleared by the [employer] FDA and  has been authorized for detection and/or diagnosis of SARS-CoV-2 by FDA under an Emergency Use Authorization (EUA).  This EUA will remain in effect (meaning this test can be used) f or the duration of  the COVID-19 declaration under Section 564(b)(1) of the Act, 21 U.S.C. section 360bbb-3(b)(1), unless the authorization is terminated or revoked sooner.  Influenza A by PCR NEGATIVE NEGATIVE Final   Influenza B by PCR NEGATIVE NEGATIVE Final    Comment: (NOTE) The Xpert Xpress SARS-CoV-2/FLU/RSV plus assay is intended as an aid in the diagnosis of influenza from Nasopharyngeal swab specimens and should not be used as a sole basis for treatment. Nasal washings and aspirates are unacceptable for Xpert Xpress SARS-CoV-2/FLU/RSV testing.  Fact Sheet for Patients: BloggerCourse.comhttps://www.fda.gov/media/152166/download  Fact Sheet for Healthcare Providers: SeriousBroker.ithttps://www.fda.gov/media/152162/download  This test is not yet approved or cleared by the [place] FDA and has been authorized for detection and/or diagnosis of SARS-CoV-2 by FDA under an Emergency Use Authorization (EUA). This EUA will remain in effect (meaning this test can be used) for the duration of the COVID-19 declaration under Section 564(b)(1) of the Act, 21 U.S.C. section 360bbb-3(b)(1), unless the authorization is terminated or revoked.  Performed at [facility], [address]  [place], [place] [address]   SARS CORONAVIRUS 2 (TAT 6-24 HRS) Nasopharyngeal Nasopharyngeal Swab     Status: Abnormal   Collection Time: 02/17/20  2:29 PM   Specimen: Nasopharyngeal Swab  Result Value Ref Range Status   SARS Coronavirus 2 POSITIVE (A) NEGATIVE Final    Comment: (NOTE) SARS-CoV-2 target nucleic acids are DETECTED.  The SARS-CoV-2 RNA is generally detectable in upper and lower respiratory specimens during the acute phase of infection. Positive results are indicative of the presence of SARS-CoV-2 RNA. Clinical correlation with patient history and other diagnostic information is  necessary to determine patient infection status. Positive results do not rule out bacterial infection or co-infection with other viruses.  The expected result is Negative.  Fact Sheet for Patients: HairSlick.nohttps://www.fda.gov/media/138098/download  Fact Sheet for Healthcare Providers: quierodirigir.comhttps://www.fda.gov/media/138095/download  This test is not yet approved or cleared by the [place] FDA and  has been authorized for detection and/or diagnosis of SARS-CoV-2 by FDA under an Emergency Use Authorization (EUA). This EUA will remain  in effect (meaning this test can be used) for the duration of the COVID-19 declaration under Section 564(b)(1) of the Act, 21 U. S.C. section 360bbb-3(b)(1), unless the authorization is terminated or revoked sooner.   Performed at [facility], [address] [place], [place] [address]   Resp Panel by RT-PCR (Flu A&B, Covid) Nasopharyngeal Swab     Status: Abnormal   Collection Time: 02/18/20  2:25 PM   Specimen: Nasopharyngeal Swab; Nasopharyngeal(NP) swabs in vial transport medium  Result Value Ref Range Status   SARS Coronavirus 2 by RT PCR POSITIVE (A) NEGATIVE Final    Comment: RESULT CALLED TO, READ BACK BY AND VERIFIED WITH: Yong ChannelL. Huynh RN 15:40 02/18/20 ([email or web address]) (NOTE) SARS-CoV-2 target nucleic acids are DETECTED.  The SARS-CoV-2 RNA is generally  detectable in upper respiratory specimens during the acute phase of infection. Positive results are indicative of the presence of the identified virus, but do not rule out bacterial infection or co-infection with other pathogens not detected by the test. Clinical correlation with patient history and other diagnostic information is necessary to determine patient infection status. The expected result is Negative.  Fact Sheet for Patients: BloggerCourse.comhttps://www.fda.gov/media/152166/download  Fact Sheet for Healthcare Providers: SeriousBroker.ithttps://www.fda.gov/media/152162/download  This test is not yet approved or cleared by the [place] FDA and  has been authorized for detection and/or diagnosis of SARS-CoV-2 by FDA under an Emergency Use Authorization (EUA).  This EUA will remain in effect (meaning this test can  be used) for the duration of  the COVID-19 declaration under Section 564(b)(1) of the  Act, 21 U.S.C. section 360bbb-3(b)(1), unless the authorization is terminated or revoked sooner.     Influenza A by PCR NEGATIVE NEGATIVE Final   Influenza B by PCR NEGATIVE NEGATIVE Final    Comment: (NOTE) The Xpert Xpress SARS-CoV-2/FLU/RSV plus assay is intended as an aid in the diagnosis of influenza from Nasopharyngeal swab specimens and should not be used as a sole basis for treatment. Nasal washings and aspirates are unacceptable for Xpert Xpress SARS-CoV-2/FLU/RSV testing.  Fact Sheet for Patients: BloggerCourse.com  Fact Sheet for Healthcare Providers: SeriousBroker.it  This test is not yet approved or cleared by the [place] FDA and has been authorized for detection and/or diagnosis of SARS-CoV-2 by FDA under an Emergency Use Authorization (EUA). This EUA will remain in effect (meaning this test can be used) for the duration of the COVID-19 declaration under Section 564(b)(1) of the Act, 21 U.S.C. section 360bbb-3(b)(1), unless the  authorization is terminated or revoked.  Performed at [facility], [address] [place], [place] [address]     Studies: DG Abd Portable 1V  Result Date: 02/20/2020 CLINICAL DATA:  Intractable nausea and vomiting, COVID-19 positive EXAM: PORTABLE ABDOMEN - 1 VIEW COMPARISON:  Portable exam 1100 hours compared to 01/17/2020 FINDINGS: Nonobstructive bowel gas pattern. Gas in sigmoid loop. No bowel dilatation or bowel wall thickening. Bones demineralized. No urinary tract calcification. Surgical clips RIGHT upper quadrant question cholecystectomy IMPRESSION: Nonobstructive bowel gas pattern Electronically Signed   By: Ulyses Southward M.D.   On: 02/20/2020 11:21     Scheduled Meds: . apixaban  5 mg Oral BID  . ascorbic acid  500 mg Oral Daily  . atorvastatin  80 mg Oral QHS  . buPROPion  75 mg Oral Daily  . doxycycline  100 mg Oral BID  . fluconazole  100 mg Oral Daily  . gabapentin  200 mg Oral q AM  . levothyroxine  100 mcg Oral Q0600  . lidocaine  1 patch Transdermal Q24H  . liver oil-zinc oxide   Topical BID  . metoprolol tartrate  12.5 mg Oral BID  . nitroGLYCERIN  0.4 mg Transdermal Daily  . pantoprazole  40 mg Oral Q0600  . polyethylene glycol  17 g Oral Daily  . senna-docusate  1 tablet Oral BID  . simethicone  80 mg Oral QID   Continuous Infusions: . dextrose 5 % and 0.45% NaCl 50 mL/hr at 02/20/20 1539   PRN Meds: acetaminophen, HYDROcodone-acetaminophen, nitroGLYCERIN, ondansetron (ZOFRAN) IV, ondansetron  Time spent: 35 minutes  Author: Lynden Oxford, MD [facility] 02/20/2020 8:50 PM  To reach On-call, see care teams to locate the attending and reach out to them via [email or web address]. If 7PM-7AM, please contact night-coverage If you still have difficulty reaching the attending provider, please page the [facility] (Director on Call) for [facility] on amion for assistance.

## 2020-02-20 NOTE — Evaluation (Signed)
Clinical/Bedside Swallow Evaluation Patient Details  Name: Laurie Chavez MRN: [id number] Date of Birth: 02/01/1951  Today's Date: 02/20/2020 Time: SLP Start Time (ACUTE ONLY): 0907 SLP Stop Time (ACUTE ONLY): 7741 SLP Time Calculation (min) (ACUTE ONLY): 15 min  Past Medical History:  Past Medical History:  Diagnosis Date  . Acute on chronic respiratory failure with hypoxia (HCC)   . COVID-19 virus infection   . Obstructive sleep apnea   . Paroxysmal atrial fibrillation (HCC)   . Pulmonary embolism and infarction [facility])    Past Surgical History: History reviewed. No pertinent surgical history. HPI:  Pt is a 70 years old female with PMH of CAD with multiple stents, HLD, PE, OSA, paroxysmal atrial fibrillation, COVID infection, morbid obesity and bilateral lower leg weakness who presented to the ED with substernal chest pain. CXR 1/9: Scattered bibasilar linear opacities, unchanged and consistent with scarring versus atelectasis. Pt had MBS on 11/26 which only assessed solids, but was negative for aspiration.   Assessment / Plan / Recommendation Clinical Impression  Pt was seen for bedside swallow evaluation. She reported that large pills inconsistently "get stuck", but she denied any other symptoms of oropharyngeal dysphagia. Oral mechanism exam was [facility] and dentition was adequate. Trials were limited to ice chips and liquids due to pt's c/o of nausea and her reported fear of emesis. Per the pt, she has had n/v since 1/11, but denied any progression of these symptoms. No signs or symptoms of oropharyngeal dysphagia were noted with limited trials. Pt's current diet may be continued; SLP will follow to assess solids, and for further intervention if warranted. SLP Visit Diagnosis: Dysphagia, unspecified (R13.10)    Aspiration Risk  No limitations    Diet Recommendation Regular;Thin liquid   Liquid Administration via: Cup;Straw Medication Administration: Crushed with puree Supervision:  Patient able to self feed Compensations: Slow rate;Small sips/bites Postural Changes: Seated upright at 90 degrees    Other  Recommendations     Follow up Recommendations  (TBD)      Frequency and Duration min 2x/week  1 week       Prognosis        Swallow Study   General Date of Onset: 02/19/20 HPI: Pt is a 69 years old female with PMH of CAD with multiple stents, HLD, PE, OSA, paroxysmal atrial fibrillation, COVID infection, morbid obesity and bilateral lower leg weakness who presented to the ED with substernal chest pain. CXR 1/9: Scattered bibasilar linear opacities, unchanged and consistent with scarring versus atelectasis. Pt had MBS on 11/26 which only assessed solids, but was negative for aspiration. Type of Study: Bedside Swallow Evaluation Previous Swallow Assessment: See HPI Diet Prior to this Study: Regular;Thin liquids Temperature Spikes Noted: No Respiratory Status: Room air History of Recent Intubation: No Behavior/Cognition: Alert;Cooperative;Pleasant mood Oral Cavity Assessment: Within Functional Limits Oral Care Completed by SLP: No Oral Cavity - Dentition: Adequate natural dentition Vision: Functional for self-feeding Self-Feeding Abilities: Able to feed self Patient Positioning: Upright in bed;Postural control adequate for testing Baseline Vocal Quality: Normal Volitional Cough: Strong Volitional Swallow: Able to elicit    Oral/Motor/Sensory Function Overall Oral Motor/Sensory Function: Within functional limits   Ice Chips Ice chips: Within functional limits Presentation: Spoon   Thin Liquid Thin Liquid: Within functional limits Presentation: Straw    Nectar Thick Nectar Thick Liquid: Not tested   Honey Thick Honey Thick Liquid: Not tested   Puree Puree: Not tested (Pt refused)   Solid     Solid: Not tested (Pt refused)  Laurie Chavez I. Vear Clock, MS, CCC-SLP Acute Rehabilitation Services Office number [phone] Pager [phone]  Laurie Chavez 02/20/2020,9:36 AM

## 2020-02-20 NOTE — Progress Notes (Signed)
Ref: Patient, No Pcp Per   Subjective:  Awake. Nauseated. Not eating much per nurse. Heart rate improved, off diltiazem  Objective:  Vital Signs in the last 24 hours: Temp:  [97.7 F (36.5 C)-98.8 F (37.1 C)] 97.7 F (36.5 C) (01/12 1540) Pulse Rate:  [58-67] 67 (01/12 1540) Cardiac Rhythm: Normal sinus rhythm (01/12 1003) Resp:  [14-20] 18 (01/12 1540) BP: (119-151)/(60-91) 132/91 (01/12 1540) SpO2:  [97 %-99 %] 99 % (01/12 1540)  Physical Exam: BP Readings from Last 1 Encounters:  02/20/20 (!) 132/91     Wt Readings from Last 1 Encounters:  02/17/20 86.8 kg    Weight change:  Body mass index is 37.37 kg/m. HEENT: [place]/AT, Eyes-Blue, Conjunctiva-Pink, Sclera-Non-icteric Neck: No JVD, No bruit, Trachea midline. Lungs:  Clearing, Bilateral. Cardiac:  Regular rhythm, normal S1 and S2, no S3. II/VI systolic murmur. Abdomen:  Soft, non-tender. BS present. Extremities:  No edema present. No cyanosis. No clubbing. CNS: AxOx3, Cranial nerves grossly intact, moves all 4 extremities.  Skin: Warm and dry.   Intake/Output from previous day: No intake/output data recorded.    Lab Results: BMET    Component Value Date/Time   NA 140 02/20/2020 0959   NA 137 02/18/2020 1202   NA 138 02/17/2020 0357   K 3.9 02/20/2020 0959   K 4.1 02/18/2020 1202   K 3.7 02/17/2020 0357   CL 105 02/20/2020 0959   CL 101 02/18/2020 1202   CL 102 02/17/2020 0357   CO2 22 02/20/2020 0959   CO2 24 02/18/2020 1202   CO2 24 02/17/2020 0357   GLUCOSE 81 02/20/2020 0959   GLUCOSE 80 02/18/2020 1202   GLUCOSE 70 02/17/2020 0357   BUN 13 02/20/2020 0959   BUN 13 02/18/2020 1202   BUN 12 02/17/2020 0357   CREATININE 0.68 02/20/2020 0959   CREATININE 0.74 02/18/2020 1202   CREATININE 0.69 02/17/2020 0357   CALCIUM 8.4 (L) 02/20/2020 0959   CALCIUM 8.5 (L) 02/18/2020 1202   CALCIUM 8.4 (L) 02/17/2020 0357   GFRNONAA >60 02/20/2020 0959   GFRNONAA >60 02/18/2020 1202   GFRNONAA >60 02/17/2020  0357   CBC    Component Value Date/Time   WBC 4.2 02/18/2020 1202   RBC 3.94 02/18/2020 1202   HGB 11.9 (L) 02/18/2020 1202   HCT 37.2 02/18/2020 1202   PLT 133 (L) 02/18/2020 1202   MCV 94.4 02/18/2020 1202   MCH 30.2 02/18/2020 1202   MCHC 32.0 02/18/2020 1202   RDW 14.6 02/18/2020 1202   LYMPHSABS 1.1 02/18/2020 1202   MONOABS 0.5 02/18/2020 1202   EOSABS 0.3 02/18/2020 1202   BASOSABS 0.0 02/18/2020 1202   HEPATIC Function Panel Recent Labs    12/20/19 0438 02/18/20 1202 02/20/20 0959  PROT 5.8* 5.6* 5.5*   HEMOGLOBIN A1C No components found for: HGA1C,  MPG CARDIAC ENZYMES Lab Results  Component Value Date   CKTOTAL 24 (L) 01/20/2020   BNP No results for input(s): PROBNP in the last 8760 hours. TSH Recent Labs    12/19/19 1850 02/17/20 1245  TSH 6.025* 4.736*   CHOLESTEROL Recent Labs    02/17/20 0357  CHOL 90    Scheduled Meds: . apixaban  5 mg Oral BID  . ascorbic acid  500 mg Oral Daily  . atorvastatin  80 mg Oral QHS  . buPROPion  75 mg Oral Daily  . doxycycline  100 mg Oral BID  . fluconazole  100 mg Oral Daily  . gabapentin  200 mg  Oral q AM  . levothyroxine  100 mcg Oral Q0600  . lidocaine  1 patch Transdermal Q24H  . liver oil-zinc oxide   Topical BID  . metoprolol tartrate  12.5 mg Oral BID  . nitroGLYCERIN  0.4 mg Transdermal Daily  . pantoprazole  40 mg Oral Q0600  . polyethylene glycol  17 g Oral Daily  . senna-docusate  1 tablet Oral BID  . simethicone  80 mg Oral QID   Continuous Infusions: . dextrose 5 % and 0.45% NaCl 50 mL/hr at 02/20/20 1539  . magnesium sulfate bolus IVPB     PRN Meds:.acetaminophen, HYDROcodone-acetaminophen, nitroGLYCERIN, ondansetron (ZOFRAN) IV, ondansetron  Assessment/Plan: Acute coronary syndrome Multivessel CAD S/P multiple stents Paroxysmal atrial fibrillation Morbid obesity Hypothyroidism S/P PE S/P COVID infection and reinfection Bilateral lower leg weakness  Start IV  D5-1/2NS. Continue medical treatment.   LOS: 4 days   Time spent including chart review, lab review, examination, discussion with patient/Nurse : 30 min   Orpah Cobb  MD  02/20/2020, 5:17 PM

## 2020-02-20 NOTE — Consult Note (Signed)
WOC Nurse wound consult note Consultation was completed by review of records, images and assistance from the bedside nurse/clinical staff.  Reason for Consult: "excortiated pannus and possible Stage 2 on sacrum" Wound type: MASD (moisture associated skin damage) perineal area; addressed per MD with zinc based moisture barrier cream  Stage 2 Pressure injury; addressed appropriately with nursing skin care order set using soft silicone foam dressing Pressure Injury POA: NA Measurement: see nursing flow sheet Wound bed: sacrum; 100% pink, detailed conversation with bedside nurse (not dark purple and not black) Drainage (amount, consistency, odor) none Periwound:intact  Dressing procedure/placement/frequency: Continue silicone foam dressing; change every 3 days and lift Q shift for assess for acute changes. Apply Desitin to perineal area per MD orders.   Discussed POC with patient and bedside nurse.  Re consult if needed, will not follow at this time. Thanks  Levone Otten [employer], RN,CWOCN, CNS, CWON-AP [phone])

## 2020-02-21 DIAGNOSIS — I249 Acute ischemic heart disease, unspecified: Secondary | ICD-10-CM | POA: Diagnosis not present

## 2020-02-21 LAB — CBC WITH DIFFERENTIAL/PLATELET
Abs Immature Granulocytes: 0.02 10*3/uL (ref 0.00–0.07)
Basophils Absolute: 0 10*3/uL (ref 0.0–0.1)
Basophils Relative: 1 %
Eosinophils Absolute: 0.2 10*3/uL (ref 0.0–0.5)
Eosinophils Relative: 5 %
HCT: 38.9 % (ref 36.0–46.0)
Hemoglobin: 12.4 g/dL (ref 12.0–15.0)
Immature Granulocytes: 0 %
Lymphocytes Relative: 36 %
Lymphs Abs: 1.8 10*3/uL (ref 0.7–4.0)
MCH: 30 pg (ref 26.0–34.0)
MCHC: 31.9 g/dL (ref 30.0–36.0)
MCV: 94 fL (ref 80.0–100.0)
Monocytes Absolute: 0.7 10*3/uL (ref 0.1–1.0)
Monocytes Relative: 13 %
Neutro Abs: 2.3 10*3/uL (ref 1.7–7.7)
Neutrophils Relative %: 45 %
Platelets: 168 10*3/uL (ref 150–400)
RBC: 4.14 MIL/uL (ref 3.87–5.11)
RDW: 14.6 % (ref 11.5–15.5)
WBC: 5.1 10*3/uL (ref 4.0–10.5)
nRBC: 0 % (ref 0.0–0.2)

## 2020-02-21 LAB — COMPREHENSIVE METABOLIC PANEL
ALT: 126 U/L — ABNORMAL HIGH (ref 0–44)
AST: 167 U/L — ABNORMAL HIGH (ref 15–41)
Albumin: 2.7 g/dL — ABNORMAL LOW (ref 3.5–5.0)
Alkaline Phosphatase: 80 U/L (ref 38–126)
Anion gap: 12 (ref 5–15)
BUN: 12 mg/dL (ref 8–23)
CO2: 23 mmol/L (ref 22–32)
Calcium: 8.5 mg/dL — ABNORMAL LOW (ref 8.9–10.3)
Chloride: 104 mmol/L (ref 98–111)
Creatinine, Ser: 0.76 mg/dL (ref 0.44–1.00)
GFR, Estimated: >60 mL/min (ref >60)
Glucose, Bld: 86 mg/dL (ref 70–99)
Potassium: 3.7 mmol/L (ref 3.5–5.1)
Sodium: 139 mmol/L (ref 135–145)
Total Bilirubin: 1 mg/dL (ref 0.3–1.2)
Total Protein: 5.7 g/dL — ABNORMAL LOW (ref 6.5–8.1)

## 2020-02-21 LAB — MAGNESIUM: Magnesium: 1.9 mg/dL (ref 1.7–2.4)

## 2020-02-21 MED ORDER — SIMETHICONE 80 MG PO CHEW: 80.0000 mg | CHEWABLE_TABLET | Freq: Four times a day (QID) | ORAL | Status: DC | PRN

## 2020-02-21 MED ORDER — SENNOSIDES-DOCUSATE SODIUM 8.6-50 MG PO TABS: 1.0000 | ORAL_TABLET | Freq: Every evening | ORAL | Status: DC | PRN

## 2020-02-21 MED ORDER — ENSURE ENLIVE PO LIQD
237.0000 mL | Freq: Two times a day (BID) | ORAL | Status: DC
  Administered 2020-02-22 – 2020-02-25 (×4): 237 mL via ORAL

## 2020-02-21 MED ORDER — GABAPENTIN 300 MG PO CAPS
300.0000 mg | ORAL_CAPSULE | Freq: Two times a day (BID) | ORAL | Status: DC
  Administered 2020-02-21 – 2020-02-23 (×5): 300 mg via ORAL
  Filled 2020-02-21 (×6): qty 1

## 2020-02-21 NOTE — Progress Notes (Signed)
[facility] Consultation Progress Note  Patient: Laurie Chavez MHD:[id number]   PCP: Patient, No Pcp Per DOB: 1950/03/11   DOA: 02/15/2020   DOS: 02/21/2020   Date of Service: the patient was seen and examined on 02/21/2020 Primary service: Orpah Cobb, MD    Brief hospital course: Pt. with PMH of CAD, A. fib; admitted on 02/15/2020, with complaint of chest pain found to have ACS. Hospitalist were consulted for medical management of COVID-19 positive test. Currently further plan is improvement in oral intake.  Subjective: Nausea improving.  Bowel movements without any blood.  No abdominal pain.  No diarrhea no constipation.  Assessment and Plan: 1.Acute COVID-19 Viral infection. CXR: hazy bilateral peripheral opacities Oxygen requirement: Room air CRP: Normal Remdesivir: Completed 3 days of remdesivir therapy Steroids: Not indicated Baricitinib/Actemra: Not indicated Antibiotics: Not indicated DVT Prophylaxis:  apixaban (ELIQUIS) tablet 5 mg  Prone positioning and incentive spirometer use recommended.  Overall plan: Stable from COVID-19 perspective.  Do not think that the nausea has an association with her COVID-19 infection  2.  Intractable nausea and vomiting X-ray abdomen negative. Continue as needed Zofran. As needed Gas-X. Resolving. Recommend to continue stool regimen.  3. yeast infection of the perineal area as well as pannus. Initiated on Diflucan. Desitin locally. Appreciate wound care.  4. elevated LFT. Likely combination of hemodynamic injury from ACS as well as RVR and medication injury Do not think that there is any association between LFT elevation as well as nausea although we would like to continue to monitor for now.  5. ACS. A. fib with RVR. hyperlipidemia. Medical management suggested by cardiology. Management per cardiology.  6.  Obesity. Placing the patient at high risk for poor outcome from COVID-19 pneumonia. Also pannus infection can  worsen.  Body mass index is 36.21 kg/m.   7. stage II sacral pressure injury Foam dressing.  8. deconditioning. Patient reports generalized weakness. Get PT OT evaluation.  Recommendation on discharge: Monitor LFT  Diet: Cardiac diet  Family Communication: no family was present at bedside, at the time of interview.   Disposition: We will continue to follow the patient.   Other Consultants: none Procedures: none Antibiotics: Anti-infectives (From admission, onward)   Start     Dose/Rate Route Frequency Ordered Stop   02/20/20 1415  fluconazole (DIFLUCAN) tablet 100 mg        100 mg Oral Daily 02/20/20 1319     02/19/20 1000  remdesivir 100 mg in sodium chloride 0.9 % 100 mL IVPB       "Followed by" Linked Group Details   100 mg 200 mL/hr over 30 Minutes Intravenous Daily 02/18/20 1857 02/20/20 1113   02/18/20 1945  remdesivir 200 mg in sodium chloride 0.9% 250 mL IVPB       "Followed by" Linked Group Details   200 mg 580 mL/hr over 30 Minutes Intravenous Once 02/18/20 1857 02/19/20 0113   02/16/20 1130  doxycycline (VIBRA-TABS) tablet 100 mg        100 mg Oral 2 times daily 02/16/20 1126        Objective: Physical Exam: Vitals:   02/21/20 0546 02/21/20 0738 02/21/20 1235 02/21/20 1650  BP: 130/67 (!) 142/70 (!) 141/65 (!) 129/58  Pulse: 70 61 60 63  Resp: 18 20 18 18   Temp: 98.1 F (36.7 C) 98 F (36.7 C) 97.8 F (36.6 C) 98.2 F (36.8 C)  TempSrc: Oral Oral Oral Oral  SpO2: 100% 100% 97% 98%  Weight:  Height:        Intake/Output Summary (Last 24 hours) at 02/21/2020 1820 Last data filed at 02/21/2020 1500 Gross per 24 hour  Intake 929.77 ml  Output -  Net 929.77 ml   Filed Weights   02/15/20 1626 02/17/20 2005 02/21/20 0500  Weight: 84.4 kg 86.8 kg 84.1 kg   General: Appear in mild distress, no Rash; Oral Mucosa Clear, moist. no Abnormal Neck Mass Or lumps, Conjunctiva normal  Cardiovascular: S1 and S2 Present, no Murmur, Respiratory:  increased respiratory effort, Bilateral Air entry present and bilateral  Crackles, no wheezes Abdomen: Bowel Sound present, Soft and no tenderness Extremities: trace Pedal edema Neurology: alert and oriented to time, place, and person affect appropriate. no new focal deficit Gait not checked due to patient safety concerns    Data Reviewed: CBC: Recent Labs  Lab 02/15/20 1625 02/17/20 0357 02/18/20 1202 02/21/20 0205  WBC 3.5* 3.4* 4.2 5.1  NEUTROABS  --   --  2.3 2.3  HGB 11.8* 12.2 11.9* 12.4  HCT 38.6 37.0 37.2 38.9  MCV 96.7 93.9 94.4 94.0  PLT 137* 116* 133* 168   Basic Metabolic Panel: Recent Labs  Lab 02/15/20 1625 02/17/20 0357 02/18/20 1202 02/20/20 0959 02/21/20 0205  NA 137 138 137 140 139  K 3.7 3.7 4.1 3.9 3.7  CL 103 102 101 105 104  CO2 22 24 24 22 23   GLUCOSE 85 70 80 81 86  BUN 8 12 13 13 12   CREATININE 0.72 0.69 0.74 0.68 0.76  CALCIUM 8.2* 8.4* 8.5* 8.4* 8.5*  MG  --   --   --  1.4* 1.9   Liver Function Tests: Recent Labs  Lab 02/18/20 1202 02/20/20 0959 02/21/20 0205  AST 98* 163* 167*  ALT 70* 116* 126*  ALKPHOS 71 74 80  BILITOT 0.9 0.9 1.0  PROT 5.6* 5.5* 5.7*  ALBUMIN 2.7* 2.6* 2.7*   No results for input(s): LIPASE, AMYLASE in the last 168 hours. No results for input(s): AMMONIA in the last 168 hours. Cardiac Enzymes: No results for input(s): CKTOTAL, CKMB, CKMBINDEX, TROPONINI in the last 168 hours. BNP (last 3 results) Recent Labs    12/19/19 1850  BNP 248.5*   CBG: No results for input(s): GLUCAP in the last 168 hours. Recent Results (from the past 240 hour(s))  Resp Panel by RT-PCR (Flu A&B, Covid) Nasopharyngeal Swab     Status: Abnormal   Collection Time: 02/16/20  8:05 AM   Specimen: Nasopharyngeal Swab; Nasopharyngeal(NP) swabs in vial transport medium  Result Value Ref Range Status   SARS Coronavirus 2 by RT PCR POSITIVE (A) NEGATIVE Final    Comment: RESULT CALLED TO, READ BACK BY AND VERIFIED WITH: RN LIZ M.  [id number] FCP (NOTE) SARS-CoV-2 target nucleic acids are DETECTED.  The SARS-CoV-2 RNA is generally detectable in upper respiratory specimens during the acute phase of infection. Positive results are indicative of the presence of the identified virus, but do not rule out bacterial infection or co-infection with other pathogens not detected by the test. Clinical correlation with patient history and other diagnostic information is necessary to determine patient infection status. The expected result is Negative.  Fact Sheet for Patients: 04/15/20  Fact Sheet for Healthcare Providers: 01-28-1976  This test is not yet approved or cleared by the [employer] FDA and  has been authorized for detection and/or diagnosis of SARS-CoV-2 by FDA under an Emergency Use Authorization (EUA).  This EUA will remain in effect (meaning this  test can be used) f or the duration of  the COVID-19 declaration under Section 564(b)(1) of the Act, 21 U.S.C. section 360bbb-3(b)(1), unless the authorization is terminated or revoked sooner.     Influenza A by PCR NEGATIVE NEGATIVE Final   Influenza B by PCR NEGATIVE NEGATIVE Final    Comment: (NOTE) The Xpert Xpress SARS-CoV-2/FLU/RSV plus assay is intended as an aid in the diagnosis of influenza from Nasopharyngeal swab specimens and should not be used as a sole basis for treatment. Nasal washings and aspirates are unacceptable for Xpert Xpress SARS-CoV-2/FLU/RSV testing.  Fact Sheet for Patients: BloggerCourse.comhttps://www.fda.gov/media/152166/download  Fact Sheet for Healthcare Providers: SeriousBroker.ithttps://www.fda.gov/media/152162/download  This test is not yet approved or cleared by the [place] FDA and has been authorized for detection and/or diagnosis of SARS-CoV-2 by FDA under an Emergency Use Authorization (EUA). This EUA will remain in effect (meaning this test can be used) for the duration of  the COVID-19 declaration under Section 564(b)(1) of the Act, 21 U.S.C. section 360bbb-3(b)(1), unless the authorization is terminated or revoked.  Performed at [facility], [address] [place], [place] [address]   SARS CORONAVIRUS 2 (TAT 6-24 HRS) Nasopharyngeal Nasopharyngeal Swab     Status: Abnormal   Collection Time: 02/17/20  2:29 PM   Specimen: Nasopharyngeal Swab  Result Value Ref Range Status   SARS Coronavirus 2 POSITIVE (A) NEGATIVE Final    Comment: (NOTE) SARS-CoV-2 target nucleic acids are DETECTED.  The SARS-CoV-2 RNA is generally detectable in upper and lower respiratory specimens during the acute phase of infection. Positive results are indicative of the presence of SARS-CoV-2 RNA. Clinical correlation with patient history and other diagnostic information is  necessary to determine patient infection status. Positive results do not rule out bacterial infection or co-infection with other viruses.  The expected result is Negative.  Fact Sheet for Patients: HairSlick.nohttps://www.fda.gov/media/138098/download  Fact Sheet for Healthcare Providers: quierodirigir.comhttps://www.fda.gov/media/138095/download  This test is not yet approved or cleared by the [place] FDA and  has been authorized for detection and/or diagnosis of SARS-CoV-2 by FDA under an Emergency Use Authorization (EUA). This EUA will remain  in effect (meaning this test can be used) for the duration of the COVID-19 declaration under Section 564(b)(1) of the Act, 21 U. S.C. section 360bbb-3(b)(1), unless the authorization is terminated or revoked sooner.   Performed at [facility], [address] [place], [place] [address]   Resp Panel by RT-PCR (Flu A&B, Covid) Nasopharyngeal Swab     Status: Abnormal   Collection Time: 02/18/20  2:25 PM   Specimen: Nasopharyngeal Swab; Nasopharyngeal(NP) swabs in vial transport medium  Result Value Ref Range Status   SARS Coronavirus 2 by RT PCR POSITIVE (A)  NEGATIVE Final    Comment: RESULT CALLED TO, READ BACK BY AND VERIFIED WITH: Yong ChannelL. Huynh RN 15:40 02/18/20 ([email or web address]) (NOTE) SARS-CoV-2 target nucleic acids are DETECTED.  The SARS-CoV-2 RNA is generally detectable in upper respiratory specimens during the acute phase of infection. Positive results are indicative of the presence of the identified virus, but do not rule out bacterial infection or co-infection with other pathogens not detected by the test. Clinical correlation with patient history and other diagnostic information is necessary to determine patient infection status. The expected result is Negative.  Fact Sheet for Patients: BloggerCourse.comhttps://www.fda.gov/media/152166/download  Fact Sheet for Healthcare Providers: SeriousBroker.ithttps://www.fda.gov/media/152162/download  This test is not yet approved or cleared by the [place] FDA and  has been authorized for detection and/or diagnosis of SARS-CoV-2  by FDA under an Emergency Use Authorization (EUA).  This EUA will remain in effect (meaning this test can  be used) for the duration of  the COVID-19 declaration under Section 564(b)(1) of the Act, 21 U.S.C. section 360bbb-3(b)(1), unless the authorization is terminated or revoked sooner.     Influenza A by PCR NEGATIVE NEGATIVE Final   Influenza B by PCR NEGATIVE NEGATIVE Final    Comment: (NOTE) The Xpert Xpress SARS-CoV-2/FLU/RSV plus assay is intended as an aid in the diagnosis of influenza from Nasopharyngeal swab specimens and should not be used as a sole basis for treatment. Nasal washings and aspirates are unacceptable for Xpert Xpress SARS-CoV-2/FLU/RSV testing.  Fact Sheet for Patients: BloggerCourse.com  Fact Sheet for Healthcare Providers: SeriousBroker.it  This test is not yet approved or cleared by the [place] FDA and has been authorized for detection and/or diagnosis of SARS-CoV-2 by FDA under an Emergency Use  Authorization (EUA). This EUA will remain in effect (meaning this test can be used) for the duration of the COVID-19 declaration under Section 564(b)(1) of the Act, 21 U.S.C. section 360bbb-3(b)(1), unless the authorization is terminated or revoked.  Performed at [facility], [address] [place], [place] [address]     Studies: No results found.   Scheduled Meds: . apixaban  5 mg Oral BID  . ascorbic acid  500 mg Oral Daily  . atorvastatin  80 mg Oral QHS  . buPROPion  75 mg Oral Daily  . doxycycline  100 mg Oral BID  . feeding supplement  237 mL Oral BID BM  . fluconazole  100 mg Oral Daily  . gabapentin  300 mg Oral BID  . levothyroxine  100 mcg Oral Q0600  . lidocaine  1 patch Transdermal Q24H  . liver oil-zinc oxide   Topical BID  . metoprolol tartrate  12.5 mg Oral BID  . nitroGLYCERIN  0.4 mg Transdermal Daily  . pantoprazole  40 mg Oral Q0600  . polyethylene glycol  17 g Oral Daily   Continuous Infusions:  PRN Meds: acetaminophen, HYDROcodone-acetaminophen, nitroGLYCERIN, ondansetron (ZOFRAN) IV, ondansetron, senna-docusate, simethicone  Time spent: 35 minutes  Author: Lynden Oxford, MD [facility] 02/21/2020 6:20 PM  To reach On-call, see care teams to locate the attending and reach out to them via [email or web address]. If 7PM-7AM, please contact night-coverage If you still have difficulty reaching the attending provider, please page the [facility] (Director on Call) for [facility] on amion for assistance.

## 2020-02-21 NOTE — Progress Notes (Signed)
Ref: Patient, No Pcp Per   Subjective:  Resting comfortably. Poor oral intake per nurse. Urine output is improved post hydration. LFT mildly abnormal. X-ray abdomen is non-specific bowel gas pattern.  Objective:  Vital Signs in the last 24 hours: Temp:  [97.7 F (36.5 C)-99.7 F (37.6 C)] 98 F (36.7 C) (01/13 0738) Pulse Rate:  [57-70] 61 (01/13 0738) Cardiac Rhythm: Sinus bradycardia (01/12 1902) Resp:  [16-20] 20 (01/13 0738) BP: (109-153)/(67-91) 142/70 (01/13 0738) SpO2:  [97 %-100 %] 100 % (01/13 0738) Weight:  [84.1 kg] 84.1 kg (01/13 0500)  Physical Exam: BP Readings from Last 1 Encounters:  02/21/20 (!) 142/70     Wt Readings from Last 1 Encounters:  02/21/20 84.1 kg    Weight change:  Body mass index is 36.21 kg/m. HEENT: [place]/AT, Eyes-Blue, Conjunctiva-Pink, Sclera-Non-icteric Neck: No JVD, No bruit, Trachea midline. Lungs:  Clearing, Bilateral. Cardiac:  Regular rhythm, normal S1 and S2, no S3. II/VI systolic murmur. Abdomen:  Soft, non-tender. BS present. Extremities:  No edema present. No cyanosis. No clubbing. CNS: AxOx3, Cranial nerves grossly intact, moves all 4 extremities.  Skin: Warm and dry.   Intake/Output from previous day: 01/12 0701 - 01/13 0700 In: 577.7 [I.V.:566.7; IV Piggyback:11] Out: 650 [Urine:650]    Lab Results: BMET    Component Value Date/Time   NA 139 02/21/2020 0205   NA 140 02/20/2020 0959   NA 137 02/18/2020 1202   K 3.7 02/21/2020 0205   K 3.9 02/20/2020 0959   K 4.1 02/18/2020 1202   CL 104 02/21/2020 0205   CL 105 02/20/2020 0959   CL 101 02/18/2020 1202   CO2 23 02/21/2020 0205   CO2 22 02/20/2020 0959   CO2 24 02/18/2020 1202   GLUCOSE 86 02/21/2020 0205   GLUCOSE 81 02/20/2020 0959   GLUCOSE 80 02/18/2020 1202   BUN 12 02/21/2020 0205   BUN 13 02/20/2020 0959   BUN 13 02/18/2020 1202   CREATININE 0.76 02/21/2020 0205   CREATININE 0.68 02/20/2020 0959   CREATININE 0.74 02/18/2020 1202   CALCIUM 8.5 (L)  02/21/2020 0205   CALCIUM 8.4 (L) 02/20/2020 0959   CALCIUM 8.5 (L) 02/18/2020 1202   GFRNONAA >60 02/21/2020 0205   GFRNONAA >60 02/20/2020 0959   GFRNONAA >60 02/18/2020 1202   CBC    Component Value Date/Time   WBC 5.1 02/21/2020 0205   RBC 4.14 02/21/2020 0205   HGB 12.4 02/21/2020 0205   HCT 38.9 02/21/2020 0205   PLT 168 02/21/2020 0205   MCV 94.0 02/21/2020 0205   MCH 30.0 02/21/2020 0205   MCHC 31.9 02/21/2020 0205   RDW 14.6 02/21/2020 0205   LYMPHSABS 1.8 02/21/2020 0205   MONOABS 0.7 02/21/2020 0205   EOSABS 0.2 02/21/2020 0205   BASOSABS 0.0 02/21/2020 0205   HEPATIC Function Panel Recent Labs    02/18/20 1202 02/20/20 0959 02/21/20 0205  PROT 5.6* 5.5* 5.7*   HEMOGLOBIN A1C No components found for: HGA1C,  MPG CARDIAC ENZYMES Lab Results  Component Value Date   CKTOTAL 24 (L) 01/20/2020   BNP No results for input(s): PROBNP in the last 8760 hours. TSH Recent Labs    12/19/19 1850 02/17/20 1245  TSH 6.025* 4.736*   CHOLESTEROL Recent Labs    02/17/20 0357  CHOL 90    Scheduled Meds: . apixaban  5 mg Oral BID  . ascorbic acid  500 mg Oral Daily  . atorvastatin  80 mg Oral QHS  . buPROPion  75  mg Oral Daily  . doxycycline  100 mg Oral BID  . fluconazole  100 mg Oral Daily  . gabapentin  200 mg Oral q AM  . levothyroxine  100 mcg Oral Q0600  . lidocaine  1 patch Transdermal Q24H  . liver oil-zinc oxide   Topical BID  . metoprolol tartrate  12.5 mg Oral BID  . nitroGLYCERIN  0.4 mg Transdermal Daily  . pantoprazole  40 mg Oral Q0600  . polyethylene glycol  17 g Oral Daily  . senna-docusate  1 tablet Oral BID  . simethicone  80 mg Oral QID   Continuous Infusions: . dextrose 5 % and 0.45% NaCl 50 mL/hr at 02/20/20 1539   PRN Meds:.acetaminophen, HYDROcodone-acetaminophen, nitroGLYCERIN, ondansetron (ZOFRAN) IV, ondansetron  Assessment/Plan: Acute coronary syndrome Multivessel CAD S/P multiple stents Paroxysmal atrial  fibrillation Hypothyroidism S/P PE S/P COVID infection and reinfection Bilateral lower leg weakness Abnormal LFT, medication induced Loss of appetite, medication induced  Continue hydration. Encourage PO feedings. SNF/Rehab when stable post Remdesivir use.   LOS: 5 days   Time spent including chart review, lab review, examination, discussion with patient/Nurse and physician : 30 min   Orpah Cobb  MD  02/21/2020, 9:04 AM

## 2020-02-21 NOTE — Progress Notes (Signed)
  Speech Language Pathology Treatment: Dysphagia  Patient Details Name: Laurie Chavez MRN: [id number] DOB: 1950/10/02 Today's Date: 02/21/2020 Time: 3241-9914 SLP Time Calculation (min) (ACUTE ONLY): 13 min  Assessment / Plan / Recommendation Clinical Impression  Pt was seen for dysphagia treatment and was cooperative throughout the session. Pt, and nursing reported that the pt has been tolerating the current diet without overt s/sx of aspiration. Pt stated that she was able to eat dinner yesterday and some meals today with less nausea. Pt tolerated regular texture solids, and thin liquids via straw without symptoms of oropharyngeal dysphagia. It is recommended that the current diet be continued. Further skilled SLP services are not clinically indicated at this time.    HPI HPI: Pt is a 70 years old female with PMH of CAD with multiple stents, HLD, PE, OSA, paroxysmal atrial fibrillation, COVID infection, morbid obesity and bilateral lower leg weakness who presented to the ED with substernal chest pain. CXR 1/9: Scattered bibasilar linear opacities, unchanged and consistent with scarring versus atelectasis. Pt had MBS on 11/26 which only assessed solids, but was negative for aspiration. Abdominal imaging: Nonobstructive bowel gas pattern      SLP Plan  Discharge SLP treatment due to (comment);All goals met       Recommendations  Diet recommendations: Regular;Thin liquid Liquids provided via: Cup;Straw Medication Administration: Crushed with puree Supervision: Patient able to self feed Compensations: Slow rate;Small sips/bites Postural Changes and/or Swallow Maneuvers: Seated upright 90 degrees                Oral Care Recommendations: Oral care BID Follow up Recommendations: None SLP Visit Diagnosis: Dysphagia, unspecified (R13.10) Plan: Discharge SLP treatment due to (comment);All goals met       Uchechukwu Dhawan I. Hardin Negus, [place], [place] Office number  [phone] Pager [place] 02/21/2020, 3:55 PM

## 2020-02-22 DIAGNOSIS — I249 Acute ischemic heart disease, unspecified: Secondary | ICD-10-CM | POA: Diagnosis not present

## 2020-02-22 LAB — COMPREHENSIVE METABOLIC PANEL
ALT: 92 U/L — ABNORMAL HIGH (ref 0–44)
AST: 107 U/L — ABNORMAL HIGH (ref 15–41)
Albumin: 2.4 g/dL — ABNORMAL LOW (ref 3.5–5.0)
Alkaline Phosphatase: 71 U/L (ref 38–126)
Anion gap: 10 (ref 5–15)
BUN: 12 mg/dL (ref 8–23)
CO2: 21 mmol/L — ABNORMAL LOW (ref 22–32)
Calcium: 8.5 mg/dL — ABNORMAL LOW (ref 8.9–10.3)
Chloride: 108 mmol/L (ref 98–111)
Creatinine, Ser: 0.75 mg/dL (ref 0.44–1.00)
GFR, Estimated: >60 mL/min (ref >60)
Glucose, Bld: 80 mg/dL (ref 70–99)
Potassium: 4 mmol/L (ref 3.5–5.1)
Sodium: 139 mmol/L (ref 135–145)
Total Bilirubin: 0.5 mg/dL (ref 0.3–1.2)
Total Protein: 5 g/dL — ABNORMAL LOW (ref 6.5–8.1)

## 2020-02-22 LAB — MAGNESIUM: Magnesium: 1.7 mg/dL (ref 1.7–2.4)

## 2020-02-22 MED ORDER — DIPHENHYDRAMINE HCL 25 MG PO CAPS
25.0000 mg | ORAL_CAPSULE | Freq: Two times a day (BID) | ORAL | Status: DC
  Administered 2020-02-22 – 2020-02-25 (×6): 25 mg via ORAL
  Filled 2020-02-22 (×7): qty 1

## 2020-02-22 NOTE — Progress Notes (Signed)
Ref: Patient, No Pcp Per   Subjective:  Nausea continues. Oral intake improving per patient. C/O itching.  LFTs are improving.  Objective:  Vital Signs in the last 24 hours: Temp:  [97.8 F (36.6 C)-98.4 F (36.9 C)] 98.4 F (36.9 C) (01/14 1641) Pulse Rate:  [60-79] 71 (01/14 1641) Cardiac Rhythm: Sinus tachycardia (01/14 1015) Resp:  [15-20] 18 (01/14 1641) BP: (122-140)/(59-72) 136/72 (01/14 1641) SpO2:  [95 %-100 %] 98 % (01/14 1641) Weight:  [87.8 kg] 87.8 kg (01/14 0500)  Physical Exam: BP Readings from Last 1 Encounters:  02/22/20 136/72     Wt Readings from Last 1 Encounters:  02/22/20 87.8 kg    Weight change: 3.7 kg Body mass index is 37.8 kg/m. HEENT: [place]/AT, Eyes-Blue, PERL, EOMI, Conjunctiva-Pink, Sclera-Non-icteric Neck: No JVD, No bruit, Trachea midline. Lungs:  Clearing, Bilateral. Cardiac:  Regular rhythm, normal S1 and S2, no S3. II/VI systolic murmur. Abdomen:  Soft, non-tender. BS present. Extremities:  No edema present. No cyanosis. No clubbing. CNS: AxOx3, Cranial nerves grossly intact, moves all 4 extremities. Bilateral leg weakness continues. Skin: Warm and dry.   Intake/Output from previous day: 01/13 0701 - 01/14 0700 In: 1200 [P.O.:1200] Out: -     Lab Results: BMET    Component Value Date/Time   NA 139 02/22/2020 0830   NA 139 02/21/2020 0205   NA 140 02/20/2020 0959   K 4.0 02/22/2020 0830   K 3.7 02/21/2020 0205   K 3.9 02/20/2020 0959   CL 108 02/22/2020 0830   CL 104 02/21/2020 0205   CL 105 02/20/2020 0959   CO2 21 (L) 02/22/2020 0830   CO2 23 02/21/2020 0205   CO2 22 02/20/2020 0959   GLUCOSE 80 02/22/2020 0830   GLUCOSE 86 02/21/2020 0205   GLUCOSE 81 02/20/2020 0959   BUN 12 02/22/2020 0830   BUN 12 02/21/2020 0205   BUN 13 02/20/2020 0959   CREATININE 0.75 02/22/2020 0830   CREATININE 0.76 02/21/2020 0205   CREATININE 0.68 02/20/2020 0959   CALCIUM 8.5 (L) 02/22/2020 0830   CALCIUM 8.5 (L) 02/21/2020 0205    CALCIUM 8.4 (L) 02/20/2020 0959   GFRNONAA >60 02/22/2020 0830   GFRNONAA >60 02/21/2020 0205   GFRNONAA >60 02/20/2020 0959   CBC    Component Value Date/Time   WBC 5.1 02/21/2020 0205   RBC 4.14 02/21/2020 0205   HGB 12.4 02/21/2020 0205   HCT 38.9 02/21/2020 0205   PLT 168 02/21/2020 0205   MCV 94.0 02/21/2020 0205   MCH 30.0 02/21/2020 0205   MCHC 31.9 02/21/2020 0205   RDW 14.6 02/21/2020 0205   LYMPHSABS 1.8 02/21/2020 0205   MONOABS 0.7 02/21/2020 0205   EOSABS 0.2 02/21/2020 0205   BASOSABS 0.0 02/21/2020 0205   HEPATIC Function Panel Recent Labs    02/20/20 0959 02/21/20 0205 02/22/20 0830  PROT 5.5* 5.7* 5.0*   HEMOGLOBIN A1C No components found for: HGA1C,  MPG CARDIAC ENZYMES Lab Results  Component Value Date   CKTOTAL 24 (L) 01/20/2020   BNP No results for input(s): PROBNP in the last 8760 hours. TSH Recent Labs    12/19/19 1850 02/17/20 1245  TSH 6.025* 4.736*   CHOLESTEROL Recent Labs    02/17/20 0357  CHOL 90    Scheduled Meds: . apixaban  5 mg Oral BID  . ascorbic acid  500 mg Oral Daily  . atorvastatin  80 mg Oral QHS  . buPROPion  75 mg Oral Daily  . diphenhydrAMINE  25 mg Oral BID  . doxycycline  100 mg Oral BID  . feeding supplement  237 mL Oral BID BM  . fluconazole  100 mg Oral Daily  . gabapentin  300 mg Oral BID  . levothyroxine  100 mcg Oral Q0600  . lidocaine  1 patch Transdermal Q24H  . liver oil-zinc oxide   Topical BID  . metoprolol tartrate  12.5 mg Oral BID  . nitroGLYCERIN  0.4 mg Transdermal Daily  . pantoprazole  40 mg Oral Q0600  . polyethylene glycol  17 g Oral Daily   Continuous Infusions: PRN Meds:.acetaminophen, HYDROcodone-acetaminophen, nitroGLYCERIN, ondansetron (ZOFRAN) IV, ondansetron, senna-docusate, simethicone  Assessment/Plan: Acute coronary syndrome Multivessel CAD S/P multiple stents Paroxysmal atrial fibrillation Hypothyroidism COVID infection and reinfection S/P PE Bilateral leg  weakness Chronic lower back thoracic spine diskitis Abnormal LFT, medication induced Loss of appetite, medication induced Itching  Benadryl as needed. Continue medical treatment for CAD. Continue feeding encouragement. May transfer to rehab when stable and weather condition permit.    LOS: 6 days   Time spent including chart review, lab review, examination, discussion with patient/Nurse : 30 min   Orpah Cobb  MD  02/22/2020, 5:51 PM

## 2020-02-22 NOTE — Progress Notes (Addendum)
[facility] Consultation Progress Note  Patient: Laurie Chavez FWY:[id number]   PCP: Patient, No Pcp Per DOB: 06-05-1950   DOA: 02/15/2020   DOS: 02/22/2020   Date of Service: the patient was seen and examined on 02/22/2020 Primary service: Orpah Cobb, MD    Brief hospital course: Pt. with PMH of CAD, A. fib; admitted on 02/15/2020, with complaint of chest pain found to have ACS. Hospitalist were consulted for medical management of COVID-19 positive test. Currently further plan is improvement in oral intake.  Subjective: no vomiting.  Eating okay.  No fever no chills.  Had 2 bowel movements.  No diarrhea.  No blood in the stool.  Sleeping okay.  Assessment and Plan: 1. Acute COVID-19 Viral infection. CXR: hazy bilateral peripheral opacities Oxygen requirement: Room air CRP: Normal Remdesivir: Completed 3 days of remdesivir therapy Steroids: Not indicated Baricitinib/Actemra: Not indicated Antibiotics: Not indicated DVT Prophylaxis:  apixaban (ELIQUIS) tablet 5 mg  Prone positioning and incentive spirometer use recommended.  Overall plan: Stable from COVID-19 perspective.  Do not think that the nausea has an association with her COVID-19 infection  2.  Intractable nausea and vomiting X-ray abdomen negative. Continue as needed Zofran. As needed Gas-X. Resolved. Recommend to continue constipation regimen.  3. yeast infection of the perineal area as well as pannus. Initiated on Diflucan. Desitin locally. Appreciate wound care.  4. elevated LFT. Resolving. Likely combination of hemodynamic injury from ACS as well as RVR and medication injury Do not think that there is any association between LFT elevation as well as nausea although we would like to continue to monitor for now.  5. ACS. A. fib with RVR. hyperlipidemia. Medical management suggested by cardiology. Management per cardiology.  6.  Obesity. Placing the patient at high risk for poor outcome from COVID-19  pneumonia. Also pannus infection can worsen.  Body mass index is 37.8 kg/m.   7. stage II sacral pressure injury Foam dressing.  8. deconditioning. Patient reports generalized weakness. Get PT OT evaluation.  Recommendation on discharge: Medically stable to discharge to SNF.  Explained patient that her symptoms will gradually improve over a period of time.  Diet: Cardiac diet  Family Communication: no family was present at bedside, at the time of interview.   Disposition: We will continue to follow the patient.   Other Consultants: none Procedures: none Antibiotics: Anti-infectives (From admission, onward)   Start     Dose/Rate Route Frequency Ordered Stop   02/20/20 1415  fluconazole (DIFLUCAN) tablet 100 mg        100 mg Oral Daily 02/20/20 1319     02/19/20 1000  remdesivir 100 mg in sodium chloride 0.9 % 100 mL IVPB       "Followed by" Linked Group Details   100 mg 200 mL/hr over 30 Minutes Intravenous Daily 02/18/20 1857 02/20/20 1113   02/18/20 1945  remdesivir 200 mg in sodium chloride 0.9% 250 mL IVPB       "Followed by" Linked Group Details   200 mg 580 mL/hr over 30 Minutes Intravenous Once 02/18/20 1857 02/19/20 0113   02/16/20 1130  doxycycline (VIBRA-TABS) tablet 100 mg        100 mg Oral 2 times daily 02/16/20 1126        Objective: Physical Exam: Vitals:   02/22/20 0535 02/22/20 0834 02/22/20 1053 02/22/20 1641  BP: (!) 127/59 140/66 129/70 136/72  Pulse: 64 60 60 71  Resp: 16 18 18 18   Temp: 97.8 F (36.6 C) 97.9 F (  36.6 C) 97.8 F (36.6 C) 98.4 F (36.9 C)  TempSrc: Oral Oral Oral Oral  SpO2: 100% 100% 100% 98%  Weight:      Height:        Intake/Output Summary (Last 24 hours) at 02/22/2020 1947 Last data filed at 02/22/2020 1400 Gross per 24 hour  Intake 720 ml  Output 250 ml  Net 470 ml   Filed Weights   02/17/20 2005 02/21/20 0500 02/22/20 0500  Weight: 86.8 kg 84.1 kg 87.8 kg   General: Appear in mild distress, no Rash; Oral  Mucosa Clear, moist. no Abnormal Neck Mass Or lumps, Conjunctiva normal  Cardiovascular: S1 and S2 Present, no Murmur, Respiratory: Normal respiratory effort, Bilateral Air entry present and faint crackles, no wheezes Abdomen: Bowel Sound present, Soft and no tenderness Extremities: trace Pedal edema Neurology: alert and oriented to time, place, and person affect appropriate. no new focal deficit Gait not checked due to patient safety concerns  Data Reviewed: CBC: Recent Labs  Lab 02/17/20 0357 02/18/20 1202 02/21/20 0205  WBC 3.4* 4.2 5.1  NEUTROABS  --  2.3 2.3  HGB 12.2 11.9* 12.4  HCT 37.0 37.2 38.9  MCV 93.9 94.4 94.0  PLT 116* 133* 168   Basic Metabolic Panel: Recent Labs  Lab 02/17/20 0357 02/18/20 1202 02/20/20 0959 02/21/20 0205 02/22/20 0333 02/22/20 0830  NA 138 137 140 139  --  139  K 3.7 4.1 3.9 3.7  --  4.0  CL 102 101 105 104  --  108  CO2 24 24 22 23   --  21*  GLUCOSE 70 80 81 86  --  80  BUN 12 13 13 12   --  12  CREATININE 0.69 0.74 0.68 0.76  --  0.75  CALCIUM 8.4* 8.5* 8.4* 8.5*  --  8.5*  MG  --   --  1.4* 1.9 1.7  --    Liver Function Tests: Recent Labs  Lab 02/18/20 1202 02/20/20 0959 02/21/20 0205 02/22/20 0830  AST 98* 163* 167* 107*  ALT 70* 116* 126* 92*  ALKPHOS 71 74 80 71  BILITOT 0.9 0.9 1.0 0.5  PROT 5.6* 5.5* 5.7* 5.0*  ALBUMIN 2.7* 2.6* 2.7* 2.4*   No results for input(s): LIPASE, AMYLASE in the last 168 hours. No results for input(s): AMMONIA in the last 168 hours. Cardiac Enzymes: No results for input(s): CKTOTAL, CKMB, CKMBINDEX, TROPONINI in the last 168 hours. BNP (last 3 results) Recent Labs    12/19/19 1850  BNP 248.5*   CBG: No results for input(s): GLUCAP in the last 168 hours. Recent Results (from the past 240 hour(s))  Resp Panel by RT-PCR (Flu A&B, Covid) Nasopharyngeal Swab     Status: Abnormal   Collection Time: 02/16/20  8:05 AM   Specimen: Nasopharyngeal Swab; Nasopharyngeal(NP) swabs in vial  transport medium  Result Value Ref Range Status   SARS Coronavirus 2 by RT PCR POSITIVE (A) NEGATIVE Final    Comment: RESULT CALLED TO, READ BACK BY AND VERIFIED WITH: RN LIZ M. [id number] FCP (NOTE) SARS-CoV-2 target nucleic acids are DETECTED.  The SARS-CoV-2 RNA is generally detectable in upper respiratory specimens during the acute phase of infection. Positive results are indicative of the presence of the identified virus, but do not rule out bacterial infection or co-infection with other pathogens not detected by the test. Clinical correlation with patient history and other diagnostic information is necessary to determine patient infection status. The expected result is Negative.  Fact Sheet  for Patients: BloggerCourse.comhttps://www.fda.gov/media/152166/download  Fact Sheet for Healthcare Providers: SeriousBroker.ithttps://www.fda.gov/media/152162/download  This test is not yet approved or cleared by the [place] FDA and  has been authorized for detection and/or diagnosis of SARS-CoV-2 by FDA under an Emergency Use Authorization (EUA).  This EUA will remain in effect (meaning this test can be used) f or the duration of  the COVID-19 declaration under Section 564(b)(1) of the Act, 21 U.S.C. section 360bbb-3(b)(1), unless the authorization is terminated or revoked sooner.     Influenza A by PCR NEGATIVE NEGATIVE Final   Influenza B by PCR NEGATIVE NEGATIVE Final    Comment: (NOTE) The Xpert Xpress SARS-CoV-2/FLU/RSV plus assay is intended as an aid in the diagnosis of influenza from Nasopharyngeal swab specimens and should not be used as a sole basis for treatment. Nasal washings and aspirates are unacceptable for Xpert Xpress SARS-CoV-2/FLU/RSV testing.  Fact Sheet for Patients: BloggerCourse.comhttps://www.fda.gov/media/152166/download  Fact Sheet for Healthcare Providers: SeriousBroker.ithttps://www.fda.gov/media/152162/download  This test is not yet approved or cleared by the [place] FDA and has been authorized  for detection and/or diagnosis of SARS-CoV-2 by FDA under an Emergency Use Authorization (EUA). This EUA will remain in effect (meaning this test can be used) for the duration of the COVID-19 declaration under Section 564(b)(1) of the Act, 21 U.S.C. section 360bbb-3(b)(1), unless the authorization is terminated or revoked.  Performed at [facility], [address] [place], [place] [address]   SARS CORONAVIRUS 2 (TAT 6-24 HRS) Nasopharyngeal Nasopharyngeal Swab     Status: Abnormal   Collection Time: 02/17/20  2:29 PM   Specimen: Nasopharyngeal Swab  Result Value Ref Range Status   SARS Coronavirus 2 POSITIVE (A) NEGATIVE Final    Comment: (NOTE) SARS-CoV-2 target nucleic acids are DETECTED.  The SARS-CoV-2 RNA is generally detectable in upper and lower respiratory specimens during the acute phase of infection. Positive results are indicative of the presence of SARS-CoV-2 RNA. Clinical correlation with patient history and other diagnostic information is  necessary to determine patient infection status. Positive results do not rule out bacterial infection or co-infection with other viruses.  The expected result is Negative.  Fact Sheet for Patients: HairSlick.nohttps://www.fda.gov/media/138098/download  Fact Sheet for Healthcare Providers: quierodirigir.comhttps://www.fda.gov/media/138095/download  This test is not yet approved or cleared by the [place] FDA and  has been authorized for detection and/or diagnosis of SARS-CoV-2 by FDA under an Emergency Use Authorization (EUA). This EUA will remain  in effect (meaning this test can be used) for the duration of the COVID-19 declaration under Section 564(b)(1) of the Act, 21 U. S.C. section 360bbb-3(b)(1), unless the authorization is terminated or revoked sooner.   Performed at [facility], [address] [place], [place] [address]   Resp Panel by RT-PCR (Flu A&B, Covid) Nasopharyngeal Swab     Status: Abnormal   Collection Time:  02/18/20  2:25 PM   Specimen: Nasopharyngeal Swab; Nasopharyngeal(NP) swabs in vial transport medium  Result Value Ref Range Status   SARS Coronavirus 2 by RT PCR POSITIVE (A) NEGATIVE Final    Comment: RESULT CALLED TO, READ BACK BY AND VERIFIED WITH: Yong ChannelL. Huynh RN 15:40 02/18/20 ([email or web address]) (NOTE) SARS-CoV-2 target nucleic acids are DETECTED.  The SARS-CoV-2 RNA is generally detectable in upper respiratory specimens during the acute phase of infection. Positive results are indicative of the presence of the identified virus, but do not rule out bacterial infection or co-infection with other pathogens not detected by the test. Clinical correlation with patient history and other  diagnostic information is necessary to determine patient infection status. The expected result is Negative.  Fact Sheet for Patients: BloggerCourse.com  Fact Sheet for Healthcare Providers: SeriousBroker.it  This test is not yet approved or cleared by the [place] FDA and  has been authorized for detection and/or diagnosis of SARS-CoV-2 by FDA under an Emergency Use Authorization (EUA).  This EUA will remain in effect (meaning this test can  be used) for the duration of  the COVID-19 declaration under Section 564(b)(1) of the Act, 21 U.S.C. section 360bbb-3(b)(1), unless the authorization is terminated or revoked sooner.     Influenza A by PCR NEGATIVE NEGATIVE Final   Influenza B by PCR NEGATIVE NEGATIVE Final    Comment: (NOTE) The Xpert Xpress SARS-CoV-2/FLU/RSV plus assay is intended as an aid in the diagnosis of influenza from Nasopharyngeal swab specimens and should not be used as a sole basis for treatment. Nasal washings and aspirates are unacceptable for Xpert Xpress SARS-CoV-2/FLU/RSV testing.  Fact Sheet for Patients: BloggerCourse.com  Fact Sheet for Healthcare  Providers: SeriousBroker.it  This test is not yet approved or cleared by the [place] FDA and has been authorized for detection and/or diagnosis of SARS-CoV-2 by FDA under an Emergency Use Authorization (EUA). This EUA will remain in effect (meaning this test can be used) for the duration of the COVID-19 declaration under Section 564(b)(1) of the Act, 21 U.S.C. section 360bbb-3(b)(1), unless the authorization is terminated or revoked.  Performed at [facility], [address] [place], [place] [address]     Studies: No results found.   Scheduled Meds: . apixaban  5 mg Oral BID  . ascorbic acid  500 mg Oral Daily  . atorvastatin  80 mg Oral QHS  . buPROPion  75 mg Oral Daily  . diphenhydrAMINE  25 mg Oral BID  . doxycycline  100 mg Oral BID  . feeding supplement  237 mL Oral BID BM  . fluconazole  100 mg Oral Daily  . gabapentin  300 mg Oral BID  . levothyroxine  100 mcg Oral Q0600  . lidocaine  1 patch Transdermal Q24H  . liver oil-zinc oxide   Topical BID  . metoprolol tartrate  12.5 mg Oral BID  . nitroGLYCERIN  0.4 mg Transdermal Daily  . pantoprazole  40 mg Oral Q0600  . polyethylene glycol  17 g Oral Daily   Continuous Infusions:  PRN Meds: acetaminophen, HYDROcodone-acetaminophen, nitroGLYCERIN, ondansetron (ZOFRAN) IV, ondansetron, senna-docusate, simethicone  Time spent: 35 minutes  Author: Lynden Oxford, MD [facility] 02/22/2020 7:47 PM  To reach On-call, see care teams to locate the attending and reach out to them via [email or web address]. If 7PM-7AM, please contact night-coverage If you still have difficulty reaching the attending provider, please page the [facility] (Director on Call) for [facility] on amion for assistance.

## 2020-02-22 NOTE — Evaluation (Signed)
Physical Therapy Evaluation Patient Details Name: Laurie Chavez MRN: [id number] DOB: [birth year] Today's Date: 02/22/2020   History of Present Illness  Pt. with PMH of CAD, A. fib; admitted on 02/15/2020, with complaint of chest pain found to have ACS.  Hospitalist were consulted for medical management of COVID-19 positive test. Pt had been recovering at [facility] after a long hospitalization and [facility] stay.  Clinical Impression  Pt admitted with above diagnosis. Performed bed level eval as pt too nauseated today to get OOB. Pt with bil UEs stronger than bil LEs.  Returned with theraband for pt to use to incr strength. Will follow acutely with plan to return to SNF for rehab.  Pt currently with functional limitations due to the deficits listed below (see PT Problem List). Pt will benefit from skilled PT to increase their independence and safety with mobility to allow discharge to the venue listed below.      Follow Up Recommendations SNF;Supervision/Assistance - 24 hour    Equipment Recommendations  None recommended by PT    Recommendations for Other Services       Precautions / Restrictions Precautions Precautions: Fall Restrictions Weight Bearing Restrictions: No      Mobility  Bed Mobility Overal bed mobility: Needs Assistance Bed Mobility: Rolling Rolling: Mod assist         General bed mobility comments: Needed assist to bend LEs but can reach for rail.  Deferred OOB today due to pt with nausea.    Transfers                 General transfer comment: TBA  Ambulation/Gait             General Gait Details: Was not walking since Sept.  Stairs            Wheelchair Mobility    Modified Rankin (Stroke Patients Only)       Balance                                             Pertinent Vitals/Pain Pain Assessment: Faces Faces Pain Scale: Hurts whole lot Pain Location: feet bilaterally Pain Descriptors / Indicators:  Aching Pain Intervention(s): Limited activity within patient's tolerance;Monitored during session;Repositioned    Home Living Family/patient expects to be discharged to:: Skilled nursing facility                 Additional Comments: Sept in coma; SElect in Nov and then to [facility] care on 12/20    Prior Function Level of Independence: Needs assistance   Gait / Transfers Assistance Needed: Had just started standing with 1 person in parallel bars for a few minutes; Pivoted to wheelchair with squat pivot with mod assist  ADL's / Homemaking Assistance Needed: PT did upper body bathing and dressing after set up  Comments: lives alone and has family     Hand Dominance   Dominant Hand: Right    Extremity/Trunk Assessment   Upper Extremity Assessment Upper Extremity Assessment: Overall WFL for tasks assessed    Lower Extremity Assessment Lower Extremity Assessment: RLE deficits/detail;LLE deficits/detail RLE Deficits / Details: ankles painful and difficult to assess but at least 2-/5, knee 2/5 and hip 2/5. LLE Deficits / Details: ankles painful and difficult to assess but at least 2-/5, knee 2/5 and hip 2/5       Communication  Communication: No difficulties  Cognition Arousal/Alertness: Awake/alert Behavior During Therapy: WFL for tasks assessed/performed Overall Cognitive Status: Within Functional Limits for tasks assessed                                        General Comments General comments (skin integrity, edema, etc.): VSS on RA with sat 96%    Exercises General Exercises - Upper Extremity Shoulder Flexion: AROM;Both;10 reps;Supine Shoulder Horizontal ADduction: AROM;Both;10 reps;Supine Elbow Flexion: AROM;Both;10 reps;Supine Elbow Extension: AROM;Both;10 reps;Supine General Exercises - Lower Extremity Ankle Circles/Pumps: AROM;Both;10 reps;Supine Quad Sets: AROM;Both;10 reps;Supine Heel Slides: AAROM;Both;10 reps;Supine Straight  Leg Raises: AAROM;Both;10 reps;Supine Other Exercises Other Exercises: Issued orange theraband for shoulder exercises. also gave blue theraband for shoulder presses overhead to hold with each hand.   Assessment/Plan    PT Assessment Patient needs continued PT services  PT Problem List Decreased activity tolerance;Decreased mobility;Decreased strength;Decreased range of motion;Decreased balance;Decreased knowledge of use of DME;Decreased safety awareness;Decreased knowledge of precautions;Obesity;Pain       PT Treatment Interventions DME instruction;Functional mobility training;Therapeutic activities;Therapeutic exercise;Balance training;Patient/family education    PT Goals (Current goals can be found in the Care Plan section)  Acute Rehab PT Goals Patient Stated Goal: to get better PT Goal Formulation: With patient Time For Goal Achievement: 03/07/20 Potential to Achieve Goals: Good    Frequency Min 3X/week   Barriers to discharge Decreased caregiver support      Co-evaluation               AM-PAC PT "6 Clicks" Mobility  Outcome Measure Help needed turning from your back to your side while in a flat bed without using bedrails?: A Lot Help needed moving from lying on your back to sitting on the side of a flat bed without using bedrails?: Total Help needed moving to and from a bed to a chair (including a wheelchair)?: Total Help needed standing up from a chair using your arms (e.g., wheelchair or bedside chair)?: Total Help needed to walk in hospital room?: Total Help needed climbing 3-5 steps with a railing? : Total 6 Click Score: 7    End of Session   Activity Tolerance: Patient limited by fatigue Patient left: in bed;with call bell/phone within reach   PT Visit Diagnosis: Muscle weakness (generalized) (M62.81)    Time: 1252-1310 PT Time Calculation (min) (ACUTE ONLY): 18 min   Charges:   PT Evaluation $PT Eval Moderate Complexity: 1 Mod          Miko Markwood  W,PT Acute Rehabilitation Services Pager:  [phone]  Office:  [phone]    Berline Lopes 02/22/2020, 3:03 PM

## 2020-02-23 DIAGNOSIS — I249 Acute ischemic heart disease, unspecified: Secondary | ICD-10-CM | POA: Diagnosis not present

## 2020-02-23 DIAGNOSIS — L8990 Pressure ulcer of unspecified site, unspecified stage: Secondary | ICD-10-CM | POA: Insufficient documentation

## 2020-02-23 LAB — MAGNESIUM: Magnesium: 1.5 mg/dL — ABNORMAL LOW (ref 1.7–2.4)

## 2020-02-23 MED ORDER — MAGNESIUM SULFATE 2 GM/50ML IV SOLN
2.0000 g | Freq: Once | INTRAVENOUS | Status: AC
  Administered 2020-02-23: 2 g via INTRAVENOUS
  Filled 2020-02-23: qty 50

## 2020-02-23 MED ORDER — MAGNESIUM OXIDE 400 (241.3 MG) MG PO TABS
400.0000 mg | ORAL_TABLET | Freq: Every day | ORAL | Status: DC
  Administered 2020-02-23 – 2020-02-25 (×3): 400 mg via ORAL
  Filled 2020-02-23 (×3): qty 1

## 2020-02-23 NOTE — Progress Notes (Signed)
[facility] Consultation Progress Note  Patient: Laurie Chavez XIP:[id number]   PCP: Patient, No Pcp Per DOB: [birth year]   DOA: 02/15/2020   DOS: 02/23/2020   Date of Service: the patient was seen and examined on 02/23/2020 Primary service: Orpah Cobb, MD    Brief hospital course: Pt. with PMH of CAD, A. fib; admitted on 02/15/2020, with complaint of chest pain found to have ACS. Hospitalist were consulted for medical management of COVID-19 positive test. Currently further plan is improvement in oral intake.  Subjective: No nausea no vomiting.  No fever no chills.  No diarrhea.  Assessment and Plan: 1. Acute COVID-19 Viral infection. CXR: hazy bilateral peripheral opacities Oxygen requirement: Room air CRP: Normal Remdesivir: Completed 3 days of remdesivir therapy Steroids: Not indicated Baricitinib/Actemra: Not indicated Antibiotics: Not indicated DVT Prophylaxis:  apixaban (ELIQUIS) tablet 5 mg  Prone positioning and incentive spirometer use recommended.  Overall plan: Stable from COVID-19 perspective.    2.  Intractable nausea and vomiting-resolved X-ray abdomen negative. Continue as needed Zofran. As needed Gas-X. Recommend to continue constipation regimen.  3. yeast infection of the perineal area as well as pannus. Initiated on Diflucan.  Continue for total of 2 weeks. Desitin locally. Appreciate wound care.  4. elevated LFT. Resolving. Likely combination of hemodynamic injury from ACS as well as RVR and medication injury Do not think that there is any association between LFT elevation as well as nausea although we would like to continue to monitor for now.  5. ACS. A. fib with RVR. hyperlipidemia. Medical management suggested by cardiology. Management per cardiology.  6.  Obesity. Placing the patient at high risk for poor outcome from COVID-19 pneumonia. Also pannus infection can worsen.  Body mass index is 37.54 kg/m.   7. stage II sacral pressure  injury Foam dressing.  8. deconditioning. Patient reports generalized weakness. PT recommends SNF.  Recommendation on discharge: Medically stable to discharge to SNF. Explained patient that her symptoms will gradually improve over a period of time. Follow-up with PCP in 1 week.  Diet: Cardiac diet  Family Communication: no family was present at bedside, at the time of interview.   Disposition: We will continue to follow the patient.   Other Consultants: none Procedures: none Antibiotics: Anti-infectives (From admission, onward)   Start     Dose/Rate Route Frequency Ordered Stop   02/20/20 1415  fluconazole (DIFLUCAN) tablet 100 mg        100 mg Oral Daily 02/20/20 1319     02/19/20 1000  remdesivir 100 mg in sodium chloride 0.9 % 100 mL IVPB       "Followed by" Linked Group Details   100 mg 200 mL/hr over 30 Minutes Intravenous Daily 02/18/20 1857 02/20/20 1113   02/18/20 1945  remdesivir 200 mg in sodium chloride 0.9% 250 mL IVPB       "Followed by" Linked Group Details   200 mg 580 mL/hr over 30 Minutes Intravenous Once 02/18/20 1857 02/19/20 0113   02/16/20 1130  doxycycline (VIBRA-TABS) tablet 100 mg        100 mg Oral 2 times daily 02/16/20 1126        Objective: Physical Exam: Vitals:   02/23/20 0458 02/23/20 0500 02/23/20 0920 02/23/20 1529  BP: 138/64  131/65 126/71  Pulse: 60 60 61 75  Resp: 12 13 15 20   Temp: 97.8 F (36.6 C)  97.9 F (36.6 C) 97.7 F (36.5 C)  TempSrc: Oral  Oral Oral  SpO2: 100% 99% 100%  99%  Weight:  87.2 kg    Height:        Intake/Output Summary (Last 24 hours) at 02/23/2020 1636 Last data filed at 02/23/2020 1529 Gross per 24 hour  Intake 340 ml  Output 200 ml  Net 140 ml   Filed Weights   02/21/20 0500 02/22/20 0500 02/23/20 0500  Weight: 84.1 kg 87.8 kg 87.2 kg   General: Appear in mild distress, no Rash; Oral Mucosa Clear, moist. no Abnormal Neck Mass Or lumps, Conjunctiva normal  Cardiovascular: S1 and S2 Present, no  Murmur, Respiratory: Normal respiratory effort, Bilateral Air entry present and no crackles, no wheezes Abdomen: Bowel Sound present, Soft and no tenderness Extremities: trace Pedal edema Neurology: alert and oriented to time, place, and person affect appropriate. no new focal deficit Gait not checked due to patient safety concerns    Data Reviewed: CBC: Recent Labs  Lab 02/17/20 0357 02/18/20 1202 02/21/20 0205  WBC 3.4* 4.2 5.1  NEUTROABS  --  2.3 2.3  HGB 12.2 11.9* 12.4  HCT 37.0 37.2 38.9  MCV 93.9 94.4 94.0  PLT 116* 133* 168   Basic Metabolic Panel: Recent Labs  Lab 02/17/20 0357 02/18/20 1202 02/20/20 0959 02/21/20 0205 02/22/20 0333 02/22/20 0830 02/23/20 0138  NA 138 137 140 139  --  139  --   K 3.7 4.1 3.9 3.7  --  4.0  --   CL 102 101 105 104  --  108  --   CO2 24 24 22 23   --  21*  --   GLUCOSE 70 80 81 86  --  80  --   BUN 12 13 13 12   --  12  --   CREATININE 0.69 0.74 0.68 0.76  --  0.75  --   CALCIUM 8.4* 8.5* 8.4* 8.5*  --  8.5*  --   MG  --   --  1.4* 1.9 1.7  --  1.5*   Liver Function Tests: Recent Labs  Lab 02/18/20 1202 02/20/20 0959 02/21/20 0205 02/22/20 0830  AST 98* 163* 167* 107*  ALT 70* 116* 126* 92*  ALKPHOS 71 74 80 71  BILITOT 0.9 0.9 1.0 0.5  PROT 5.6* 5.5* 5.7* 5.0*  ALBUMIN 2.7* 2.6* 2.7* 2.4*   No results for input(s): LIPASE, AMYLASE in the last 168 hours. No results for input(s): AMMONIA in the last 168 hours. Cardiac Enzymes: No results for input(s): CKTOTAL, CKMB, CKMBINDEX, TROPONINI in the last 168 hours. BNP (last 3 results) Recent Labs    12/19/19 1850  BNP 248.5*   CBG: No results for input(s): GLUCAP in the last 168 hours. Recent Results (from the past 240 hour(s))  Resp Panel by RT-PCR (Flu A&B, Covid) Nasopharyngeal Swab     Status: Abnormal   Collection Time: 02/16/20  8:05 AM   Specimen: Nasopharyngeal Swab; Nasopharyngeal(NP) swabs in vial transport medium  Result Value Ref Range Status   SARS  Coronavirus 2 by RT PCR POSITIVE (A) NEGATIVE Final    Comment: RESULT CALLED TO, READ BACK BY AND VERIFIED WITH: RN LIZ M. [id number] FCP (NOTE) SARS-CoV-2 target nucleic acids are DETECTED.  The SARS-CoV-2 RNA is generally detectable in upper respiratory specimens during the acute phase of infection. Positive results are indicative of the presence of the identified virus, but do not rule out bacterial infection or co-infection with other pathogens not detected by the test. Clinical correlation with patient history and other diagnostic information is necessary to determine patient infection  status. The expected result is Negative.  Fact Sheet for Patients: BloggerCourse.com  Fact Sheet for Healthcare Providers: SeriousBroker.it  This test is not yet approved or cleared by the [place] FDA and  has been authorized for detection and/or diagnosis of SARS-CoV-2 by FDA under an Emergency Use Authorization (EUA).  This EUA will remain in effect (meaning this test can be used) f or the duration of  the COVID-19 declaration under Section 564(b)(1) of the Act, 21 U.S.C. section 360bbb-3(b)(1), unless the authorization is terminated or revoked sooner.     Influenza A by PCR NEGATIVE NEGATIVE Final   Influenza B by PCR NEGATIVE NEGATIVE Final    Comment: (NOTE) The Xpert Xpress SARS-CoV-2/FLU/RSV plus assay is intended as an aid in the diagnosis of influenza from Nasopharyngeal swab specimens and should not be used as a sole basis for treatment. Nasal washings and aspirates are unacceptable for Xpert Xpress SARS-CoV-2/FLU/RSV testing.  Fact Sheet for Patients: BloggerCourse.com  Fact Sheet for Healthcare Providers: SeriousBroker.it  This test is not yet approved or cleared by the [place] FDA and has been authorized for detection and/or diagnosis of SARS-CoV-2 by FDA under  an Emergency Use Authorization (EUA). This EUA will remain in effect (meaning this test can be used) for the duration of the COVID-19 declaration under Section 564(b)(1) of the Act, 21 U.S.C. section 360bbb-3(b)(1), unless the authorization is terminated or revoked.  Performed at [facility], [address], [place], [place] [address]   SARS CORONAVIRUS 2 (TAT 6-24 HRS) Nasopharyngeal Nasopharyngeal Swab     Status: Abnormal   Collection Time: 02/17/20  2:29 PM   Specimen: Nasopharyngeal Swab  Result Value Ref Range Status   SARS Coronavirus 2 POSITIVE (A) NEGATIVE Final    Comment: (NOTE) SARS-CoV-2 target nucleic acids are DETECTED.  The SARS-CoV-2 RNA is generally detectable in upper and lower respiratory specimens during the acute phase of infection. Positive results are indicative of the presence of SARS-CoV-2 RNA. Clinical correlation with patient history and other diagnostic information is  necessary to determine patient infection status. Positive results do not rule out bacterial infection or co-infection with other viruses.  The expected result is Negative.  Fact Sheet for Patients: HairSlick.no  Fact Sheet for Healthcare Providers: quierodirigir.com  This test is not yet approved or cleared by the [place] FDA and  has been authorized for detection and/or diagnosis of SARS-CoV-2 by FDA under an Emergency Use Authorization (EUA). This EUA will remain  in effect (meaning this test can be used) for the duration of the COVID-19 declaration under Section 564(b)(1) of the Act, 21 U. S.C. section 360bbb-3(b)(1), unless the authorization is terminated or revoked sooner.   Performed at [facility], [address], [place], [place] [address]   Resp Panel by RT-PCR (Flu A&B, Covid) Nasopharyngeal Swab     Status: Abnormal   Collection Time: 02/18/20  2:25 PM   Specimen: Nasopharyngeal Swab;  Nasopharyngeal(NP) swabs in vial transport medium  Result Value Ref Range Status   SARS Coronavirus 2 by RT PCR POSITIVE (A) NEGATIVE Final    Comment: RESULT CALLED TO, READ BACK BY AND VERIFIED WITH: Yong Channel RN 15:40 02/18/20 ([email or web address]) (NOTE) SARS-CoV-2 target nucleic acids are DETECTED.  The SARS-CoV-2 RNA is generally detectable in upper respiratory specimens during the acute phase of infection. Positive results are indicative of the presence of the identified virus, but do not rule out bacterial infection or co-infection with other pathogens not detected by  the test. Clinical correlation with patient history and other diagnostic information is necessary to determine patient infection status. The expected result is Negative.  Fact Sheet for Patients: BloggerCourse.comhttps://www.fda.gov/media/152166/download  Fact Sheet for Healthcare Providers: SeriousBroker.ithttps://www.fda.gov/media/152162/download  This test is not yet approved or cleared by the [place] FDA and  has been authorized for detection and/or diagnosis of SARS-CoV-2 by FDA under an Emergency Use Authorization (EUA).  This EUA will remain in effect (meaning this test can  be used) for the duration of  the COVID-19 declaration under Section 564(b)(1) of the Act, 21 U.S.C. section 360bbb-3(b)(1), unless the authorization is terminated or revoked sooner.     Influenza A by PCR NEGATIVE NEGATIVE Final   Influenza B by PCR NEGATIVE NEGATIVE Final    Comment: (NOTE) The Xpert Xpress SARS-CoV-2/FLU/RSV plus assay is intended as an aid in the diagnosis of influenza from Nasopharyngeal swab specimens and should not be used as a sole basis for treatment. Nasal washings and aspirates are unacceptable for Xpert Xpress SARS-CoV-2/FLU/RSV testing.  Fact Sheet for Patients: BloggerCourse.comhttps://www.fda.gov/media/152166/download  Fact Sheet for Healthcare Providers: SeriousBroker.ithttps://www.fda.gov/media/152162/download  This test is not yet approved or cleared by the  [place] FDA and has been authorized for detection and/or diagnosis of SARS-CoV-2 by FDA under an Emergency Use Authorization (EUA). This EUA will remain in effect (meaning this test can be used) for the duration of the COVID-19 declaration under Section 564(b)(1) of the Act, 21 U.S.C. section 360bbb-3(b)(1), unless the authorization is terminated or revoked.  Performed at [facility], [address] [place], [place] [address]     Studies: No results found.   Scheduled Meds: . apixaban  5 mg Oral BID  . ascorbic acid  500 mg Oral Daily  . atorvastatin  80 mg Oral QHS  . buPROPion  75 mg Oral Daily  . diphenhydrAMINE  25 mg Oral BID  . doxycycline  100 mg Oral BID  . feeding supplement  237 mL Oral BID BM  . fluconazole  100 mg Oral Daily  . gabapentin  300 mg Oral BID  . levothyroxine  100 mcg Oral Q0600  . lidocaine  1 patch Transdermal Q24H  . liver oil-zinc oxide   Topical BID  . magnesium oxide  400 mg Oral Daily  . metoprolol tartrate  12.5 mg Oral BID  . nitroGLYCERIN  0.4 mg Transdermal Daily  . pantoprazole  40 mg Oral Q0600  . polyethylene glycol  17 g Oral Daily   Continuous Infusions:  PRN Meds: acetaminophen, HYDROcodone-acetaminophen, nitroGLYCERIN, ondansetron (ZOFRAN) IV, ondansetron, senna-docusate, simethicone  Time spent: 35 minutes  Author: Lynden OxfordPranav Patel, MD [facility] 02/23/2020 4:36 PM  To reach On-call, see care teams to locate the attending and reach out to them via [email or web address]. If 7PM-7AM, please contact night-coverage If you still have difficulty reaching the attending provider, please page the [facility] (Director on Call) for [facility] on amion for assistance.

## 2020-02-23 NOTE — TOC Progression Note (Signed)
Transition of Care [facility]) - Progression Note    Patient Details  Name: Laurie Chavez MRN: [id number] Date of Birth: 1950-02-14  [facility] CM/SW Contact  Patrice [place], [place] Phone Number: [phone] 02/23/2020, 4:56 PM  Clinical Narrative:     2:30PM- CSW reached out to facility to ascertain if pt could be discharged today. Olegario Messier from [facility] said they could accept pt.  CSW reached out to [facility] about status of pt being discharged. CSW was informed it most likely would be Monday.  TOC team will continue to assist with discharge planning needs.  Expected Discharge Plan: Skilled Nursing Facility Barriers to Discharge: Continued Medical Work up  Expected Discharge Plan and Services Expected Discharge Plan: Skilled Nursing Facility In-house Referral: Clinical Social Work     Living arrangements for the past 2 months: Single Family Home                                       Social Determinants of Health (SDOH) Interventions    Readmission Risk Interventions No flowsheet data found.

## 2020-02-23 NOTE — Progress Notes (Signed)
Subjective:  Patient denies any chest pain or shortness of breath.  Remains in sinus rhythm.  Complains of occasional cough no fever or chills O2 sats in high 90s on room air.  Nausea improved.  Objective:  Vital Signs in the last 24 hours: Temp:  [97.8 F (36.6 C)-98.6 F (37 C)] 97.9 F (36.6 C) (01/15 0920) Pulse Rate:  [59-80] 61 (01/15 0920) Resp:  [12-18] 15 (01/15 0920) BP: (131-143)/(55-83) 131/65 (01/15 0920) SpO2:  [98 %-100 %] 100 % (01/15 0920) Weight:  [87.2 kg] 87.2 kg (01/15 0500)  Intake/Output from previous day: 01/14 0701 - 01/15 0700 In: 100 [P.O.:100] Out: 450 [Urine:440; Stool:10] Intake/Output from this shift: No intake/output data recorded.  Physical Exam: Neck: no adenopathy, no carotid bruit, no JVD and supple, symmetrical, trachea midline Lungs: Decreased breath sounds at bases Heart: regular rate and rhythm, S1, S2 normal and 2/6 systolic murmur noted Abdomen: soft, non-tender; bowel sounds normal; no masses,  no organomegaly Extremities: extremities normal, atraumatic, no cyanosis or edema  Lab Results: Recent Labs    02/21/20 0205  WBC 5.1  HGB 12.4  PLT 168   Recent Labs    02/21/20 0205 02/22/20 0830  NA 139 139  K 3.7 4.0  CL 104 108  CO2 23 21*  GLUCOSE 86 80  BUN 12 12  CREATININE 0.76 0.75   No results for input(s): TROPONINI in the last 72 hours.  Invalid input(s): CK, MB Hepatic Function Panel Recent Labs    02/22/20 0830  PROT 5.0*  ALBUMIN 2.4*  AST 107*  ALT 92*  ALKPHOS 71  BILITOT 0.5   No results for input(s): CHOL in the last 72 hours. No results for input(s): PROTIME in the last 72 hours.  Imaging: Imaging results have been reviewed and No results found.  Cardiac Studies:  Assessment/Plan:  Stable angina MI ruled out Multivessel CAD S/P multiple stents in the past in [place] Paroxysmal atrial fibrillation status post synchronized DC  cardioversion in the past Hypothyroidism COVID infection and  reinfection S/P PE Bilateral leg weakness Status post intractable nausea  Deconditioning Morbid obesity Stage II sacral pressure ulcers Plan Out of bed to chair PT consult Awaiting skilled nursing facility  LOS: 7 days    Rinaldo Cloud 02/23/2020, 11:43 AM

## 2020-02-24 DIAGNOSIS — I249 Acute ischemic heart disease, unspecified: Secondary | ICD-10-CM | POA: Diagnosis not present

## 2020-02-24 LAB — MAGNESIUM: Magnesium: 2.1 mg/dL (ref 1.7–2.4)

## 2020-02-24 MED ORDER — GABAPENTIN 300 MG PO CAPS
300.0000 mg | ORAL_CAPSULE | Freq: Every day | ORAL | Status: DC
  Administered 2020-02-24: 300 mg via ORAL
  Filled 2020-02-24: qty 1

## 2020-02-24 NOTE — Progress Notes (Signed)
[facility] Consultation Progress Note  Patient: Laurie Chavez ONG:[id number]   PCP: Patient, No Pcp Per DOB: Jun 04, 1950   DOA: 02/15/2020   DOS: 02/24/2020   Date of Service: the patient was seen and examined on 02/24/2020 Primary service: Orpah CobbKadakia, Ajay, MD    Brief hospital course: Pt. with PMH of CAD, A. fib; admitted on 02/15/2020, with complaint of chest pain found to have ACS. Hospitalist were consulted for medical management of COVID-19 positive test. Currently further plan monitor for improvement in mentation  Subjective: More sleepy today.  No nausea or vomiting.  No acute complaint. Reported dizziness yesterday.  Currently no dizziness.  Assessment and Plan: 1. Acute COVID-19 Viral infection. CXR: hazy bilateral peripheral opacities Oxygen requirement: Room air CRP: Normal Remdesivir: Completed 3 days of remdesivir therapy Steroids: Not indicated Baricitinib/Actemra: Not indicated Antibiotics: Not indicated DVT Prophylaxis:  apixaban (ELIQUIS) tablet 5 mg  Prone positioning and incentive spirometer use recommended.  Overall plan: Stable from COVID-19 perspective.    2.  Intractable nausea and vomiting-resolved X-ray abdomen negative. Continue as needed Zofran. As needed Gas-X. Recommend to continue constipation regimen.  3. Yeast infection of the perineal area as well as pannus. Initiated on Diflucan.  Continue for total of 2 weeks. Desitin locally. Appreciate wound care.  4. Elevated LFT. Resolving. Likely combination of hemodynamic injury from ACS as well as RVR and medication injury Do not think that there is any association between LFT elevation as well as nausea although we would like to continue to monitor for now.  5. ACS. A. fib with RVR. hyperlipidemia. Medical management suggested by cardiology. Management per cardiology.  6.  Obesity. Placing the patient at high risk for poor outcome from COVID-19 pneumonia. Also pannus infection can worsen.   Body mass index is 37.54 kg/m.   7. stage II sacral pressure injury Foam dressing.  8. deconditioning. Patient reports generalized weakness. PT recommends SNF.  9. chronic neuropathic pain. Patient is on gabapentin.  due to severe pain complaint, dose was increased. Patient was on 300 mg twice daily dose. Currently lowered to 300 mg nightly dose. Home regimen is 200 mg daily.  Recommendation on discharge: Follow-up with PCP in 1 week. Monitor overnight for improvement in mentation.  Diet: Cardiac diet  Family Communication: no family was present at bedside, at the time of interview.   Disposition: We will continue to follow the patient.   Other Consultants: none Procedures: none Antibiotics: Anti-infectives (From admission, onward)   Start     Dose/Rate Route Frequency Ordered Stop   02/20/20 1415  fluconazole (DIFLUCAN) tablet 100 mg        100 mg Oral Daily 02/20/20 1319     02/19/20 1000  remdesivir 100 mg in sodium chloride 0.9 % 100 mL IVPB       "Followed by" Linked Group Details   100 mg 200 mL/hr over 30 Minutes Intravenous Daily 02/18/20 1857 02/20/20 1113   02/18/20 1945  remdesivir 200 mg in sodium chloride 0.9% 250 mL IVPB       "Followed by" Linked Group Details   200 mg 580 mL/hr over 30 Minutes Intravenous Once 02/18/20 1857 02/19/20 0113   02/16/20 1130  doxycycline (VIBRA-TABS) tablet 100 mg        100 mg Oral 2 times daily 02/16/20 1126        Objective: Physical Exam: Vitals:   02/23/20 0920 02/23/20 1529 02/24/20 0500 02/24/20 0835  BP: 131/65 126/71 (!) 145/72   Pulse: 61 75  67   Resp: 15 20 16    Temp: 97.9 F (36.6 C) 97.7 F (36.5 C) 98 F (36.7 C) 98.7 F (37.1 C)  TempSrc: Oral Oral Oral Oral  SpO2: 100% 99% 97%   Weight:      Height:        Intake/Output Summary (Last 24 hours) at 02/24/2020 1345 Last data filed at 02/23/2020 1529 Gross per 24 hour  Intake 240 ml  Output -  Net 240 ml   Filed Weights   02/21/20 0500  02/22/20 0500 02/23/20 0500  Weight: 84.1 kg 87.8 kg 87.2 kg   General: Appear in mild distress, no Rash; Oral Mucosa Clear, moist. no Abnormal Neck Mass Or lumps, Conjunctiva normal  Cardiovascular: S1 and S2 Present, no Murmur, Respiratory: normal respiratory effort, Bilateral Air entry present and bilateral  Crackles, no wheezes Abdomen: Bowel Sound present, Soft and no tenderness Extremities: trace Pedal edema Neurology: Sleepy, easily aroused.  Oriented to time, place, and person. affect appropriate. no new focal deficit Gait not checked due to patient safety concerns  Data Reviewed: CBC: Recent Labs  Lab 02/18/20 1202 02/21/20 0205  WBC 4.2 5.1  NEUTROABS 2.3 2.3  HGB 11.9* 12.4  HCT 37.2 38.9  MCV 94.4 94.0  PLT 133* 168   Basic Metabolic Panel: Recent Labs  Lab 02/18/20 1202 02/20/20 0959 02/21/20 0205 02/22/20 0333 02/22/20 0830 02/23/20 0138 02/24/20 0201  NA 137 140 139  --  139  --   --   K 4.1 3.9 3.7  --  4.0  --   --   CL 101 105 104  --  108  --   --   CO2 24 22 23   --  21*  --   --   GLUCOSE 80 81 86  --  80  --   --   BUN 13 13 12   --  12  --   --   CREATININE 0.74 0.68 0.76  --  0.75  --   --   CALCIUM 8.5* 8.4* 8.5*  --  8.5*  --   --   MG  --  1.4* 1.9 1.7  --  1.5* 2.1   Liver Function Tests: Recent Labs  Lab 02/18/20 1202 02/20/20 0959 02/21/20 0205 02/22/20 0830  AST 98* 163* 167* 107*  ALT 70* 116* 126* 92*  ALKPHOS 71 74 80 71  BILITOT 0.9 0.9 1.0 0.5  PROT 5.6* 5.5* 5.7* 5.0*  ALBUMIN 2.7* 2.6* 2.7* 2.4*   No results for input(s): LIPASE, AMYLASE in the last 168 hours. No results for input(s): AMMONIA in the last 168 hours. Cardiac Enzymes: No results for input(s): CKTOTAL, CKMB, CKMBINDEX, TROPONINI in the last 168 hours. BNP (last 3 results) Recent Labs    12/19/19 1850  BNP 248.5*   CBG: No results for input(s): GLUCAP in the last 168 hours. Recent Results (from the past 240 hour(s))  Resp Panel by RT-PCR (Flu A&B,  Covid) Nasopharyngeal Swab     Status: Abnormal   Collection Time: 02/16/20  8:05 AM   Specimen: Nasopharyngeal Swab; Nasopharyngeal(NP) swabs in vial transport medium  Result Value Ref Range Status   SARS Coronavirus 2 by RT PCR POSITIVE (A) NEGATIVE Final    Comment: RESULT CALLED TO, READ BACK BY AND VERIFIED WITH: RN LIZ M. [id number] FCP (NOTE) SARS-CoV-2 target nucleic acids are DETECTED.  The SARS-CoV-2 RNA is generally detectable in upper respiratory specimens during the acute phase of infection. Positive results are  indicative of the presence of the identified virus, but do not rule out bacterial infection or co-infection with other pathogens not detected by the test. Clinical correlation with patient history and other diagnostic information is necessary to determine patient infection status. The expected result is Negative.  Fact Sheet for Patients: BloggerCourse.com  Fact Sheet for Healthcare Providers: SeriousBroker.it  This test is not yet approved or cleared by the [place] FDA and  has been authorized for detection and/or diagnosis of SARS-CoV-2 by FDA under an Emergency Use Authorization (EUA).  This EUA will remain in effect (meaning this test can be used) f or the duration of  the COVID-19 declaration under Section 564(b)(1) of the Act, 21 U.S.C. section 360bbb-3(b)(1), unless the authorization is terminated or revoked sooner.     Influenza A by PCR NEGATIVE NEGATIVE Final   Influenza B by PCR NEGATIVE NEGATIVE Final    Comment: (NOTE) The Xpert Xpress SARS-CoV-2/FLU/RSV plus assay is intended as an aid in the diagnosis of influenza from Nasopharyngeal swab specimens and should not be used as a sole basis for treatment. Nasal washings and aspirates are unacceptable for Xpert Xpress SARS-CoV-2/FLU/RSV testing.  Fact Sheet for Patients: BloggerCourse.com  Fact Sheet for  Healthcare Providers: SeriousBroker.it  This test is not yet approved or cleared by the [place] FDA and has been authorized for detection and/or diagnosis of SARS-CoV-2 by FDA under an Emergency Use Authorization (EUA). This EUA will remain in effect (meaning this test can be used) for the duration of the COVID-19 declaration under Section 564(b)(1) of the Act, 21 U.S.C. section 360bbb-3(b)(1), unless the authorization is terminated or revoked.  Performed at [facility], [address], [place], [place] [address]   SARS CORONAVIRUS 2 (TAT 6-24 HRS) Nasopharyngeal Nasopharyngeal Swab     Status: Abnormal   Collection Time: 02/17/20  2:29 PM   Specimen: Nasopharyngeal Swab  Result Value Ref Range Status   SARS Coronavirus 2 POSITIVE (A) NEGATIVE Final    Comment: (NOTE) SARS-CoV-2 target nucleic acids are DETECTED.  The SARS-CoV-2 RNA is generally detectable in upper and lower respiratory specimens during the acute phase of infection. Positive results are indicative of the presence of SARS-CoV-2 RNA. Clinical correlation with patient history and other diagnostic information is  necessary to determine patient infection status. Positive results do not rule out bacterial infection or co-infection with other viruses.  The expected result is Negative.  Fact Sheet for Patients: HairSlick.no  Fact Sheet for Healthcare Providers: quierodirigir.com  This test is not yet approved or cleared by the [place] FDA and  has been authorized for detection and/or diagnosis of SARS-CoV-2 by FDA under an Emergency Use Authorization (EUA). This EUA will remain  in effect (meaning this test can be used) for the duration of the COVID-19 declaration under Section 564(b)(1) of the Act, 21 U. S.C. section 360bbb-3(b)(1), unless the authorization is terminated or revoked sooner.   Performed at [facility], [address] [place], [place] [address]   Resp Panel by RT-PCR (Flu A&B, Covid) Nasopharyngeal Swab     Status: Abnormal   Collection Time: 02/18/20  2:25 PM   Specimen: Nasopharyngeal Swab; Nasopharyngeal(NP) swabs in vial transport medium  Result Value Ref Range Status   SARS Coronavirus 2 by RT PCR POSITIVE (A) NEGATIVE Final    Comment: RESULT CALLED TO, READ BACK BY AND VERIFIED WITH: Yong Channel RN 15:40 02/18/20 ([email or web address]) (NOTE) SARS-CoV-2 target nucleic acids are DETECTED.  The SARS-CoV-2  RNA is generally detectable in upper respiratory specimens during the acute phase of infection. Positive results are indicative of the presence of the identified virus, but do not rule out bacterial infection or co-infection with other pathogens not detected by the test. Clinical correlation with patient history and other diagnostic information is necessary to determine patient infection status. The expected result is Negative.  Fact Sheet for Patients: BloggerCourse.com  Fact Sheet for Healthcare Providers: SeriousBroker.it  This test is not yet approved or cleared by the [place] FDA and  has been authorized for detection and/or diagnosis of SARS-CoV-2 by FDA under an Emergency Use Authorization (EUA).  This EUA will remain in effect (meaning this test can  be used) for the duration of  the COVID-19 declaration under Section 564(b)(1) of the Act, 21 U.S.C. section 360bbb-3(b)(1), unless the authorization is terminated or revoked sooner.     Influenza A by PCR NEGATIVE NEGATIVE Final   Influenza B by PCR NEGATIVE NEGATIVE Final    Comment: (NOTE) The Xpert Xpress SARS-CoV-2/FLU/RSV plus assay is intended as an aid in the diagnosis of influenza from Nasopharyngeal swab specimens and should not be used as a sole basis for treatment. Nasal washings and aspirates are unacceptable for Xpert Xpress  SARS-CoV-2/FLU/RSV testing.  Fact Sheet for Patients: BloggerCourse.com  Fact Sheet for Healthcare Providers: SeriousBroker.it  This test is not yet approved or cleared by the [place] FDA and has been authorized for detection and/or diagnosis of SARS-CoV-2 by FDA under an Emergency Use Authorization (EUA). This EUA will remain in effect (meaning this test can be used) for the duration of the COVID-19 declaration under Section 564(b)(1) of the Act, 21 U.S.C. section 360bbb-3(b)(1), unless the authorization is terminated or revoked.  Performed at [facility], [address], [place], [place] [address]     Studies: No results found.   Scheduled Meds: . apixaban  5 mg Oral BID  . ascorbic acid  500 mg Oral Daily  . atorvastatin  80 mg Oral QHS  . buPROPion  75 mg Oral Daily  . diphenhydrAMINE  25 mg Oral BID  . doxycycline  100 mg Oral BID  . feeding supplement  237 mL Oral BID BM  . fluconazole  100 mg Oral Daily  . gabapentin  300 mg Oral QHS  . levothyroxine  100 mcg Oral Q0600  . lidocaine  1 patch Transdermal Q24H  . liver oil-zinc oxide   Topical BID  . magnesium oxide  400 mg Oral Daily  . metoprolol tartrate  12.5 mg Oral BID  . nitroGLYCERIN  0.4 mg Transdermal Daily  . pantoprazole  40 mg Oral Q0600  . polyethylene glycol  17 g Oral Daily   Continuous Infusions:  PRN Meds: acetaminophen, nitroGLYCERIN, ondansetron (ZOFRAN) IV, ondansetron, senna-docusate, simethicone  Time spent: 35 minutes  Author: Lynden Oxford, MD [facility] 02/24/2020 1:45 PM  To reach On-call, see care teams to locate the attending and reach out to them via [email or web address]. If 7PM-7AM, please contact night-coverage If you still have difficulty reaching the attending provider, please page the [facility] (Director on Call) for [facility] on amion for assistance.

## 2020-02-24 NOTE — Progress Notes (Signed)
Subjective:  Patient today more drowsy and sleepy.  Denies any chest pain or shortness of breath.  Neurontin dose has been reduced.  Objective:  Vital Signs in the last 24 hours: Temp:  [97.7 F (36.5 C)-98.7 F (37.1 C)] 98.7 F (37.1 C) (01/16 0835) Pulse Rate:  [67-75] 67 (01/16 0500) Resp:  [16-20] 16 (01/16 0500) BP: (126-145)/(71-72) 145/72 (01/16 0500) SpO2:  [97 %-99 %] 97 % (01/16 0500)  Intake/Output from previous day: 01/15 0701 - 01/16 0700 In: 240 [P.O.:240] Out: -  Intake/Output from this shift: No intake/output data recorded.  Physical Exam: Neck: no adenopathy, no carotid bruit, no JVD and supple, symmetrical, trachea midline Lungs: Decreased breath sounds at bases Heart: regular rate and rhythm, S1, S2 normal and 2/6 systolic murmur noted Abdomen: soft, non-tender; bowel sounds normal; no masses,  no organomegaly Extremities: extremities normal, atraumatic, no cyanosis or edema  Lab Results: No results for input(s): WBC, HGB, PLT in the last 72 hours. Recent Labs    02/22/20 0830  NA 139  K 4.0  CL 108  CO2 21*  GLUCOSE 80  BUN 12  CREATININE 0.75   No results for input(s): TROPONINI in the last 72 hours.  Invalid input(s): CK, MB Hepatic Function Panel Recent Labs    02/22/20 0830  PROT 5.0*  ALBUMIN 2.4*  AST 107*  ALT 92*  ALKPHOS 71  BILITOT 0.5   No results for input(s): CHOL in the last 72 hours. No results for input(s): PROTIME in the last 72 hours.  Imaging: Imaging results have been reviewed and No results found.  Cardiac Studies:  Assessment/Plan:  Stable angina MI ruled out Multivessel CAD S/P multiple stents in the past in [place] Paroxysmal atrial fibrillation status post synchronized DC  cardioversion in the past Hypothyroidism COVID infection and reinfection S/P PE Bilateral leg weakness Status post intractable nausea  Deconditioning Morbid obesity Stage II sacral pressure ulcers Drowsiness secondary to  meds Plan Agree with reducing Neurontin dose Will DC hydrocodone for now Possible discharge to skilled nursing facility tomorrow  LOS: 8 days    Rinaldo Cloud 02/24/2020, 12:03 PM

## 2020-02-24 NOTE — TOC Progression Note (Signed)
Transition of Care [facility]) - Progression Note    Patient Details  Name: Laurie Chavez MRN: [id number] Date of Birth: [birth year]  Transition of [facility] CM/SW Contact  Patrice [place], [place] Phone Number: [phone] 02/24/2020, 11:18 AM  Clinical Narrative:     CSW was messaged by MD Allena Katz and confirmed Monday's discharge. CSW attempted to call [facility] and left VM that is will most likely be Monday before she is discharged.   TOC team will continue to assist with discharge planning needs.  Expected Discharge Plan: Skilled Nursing Facility Barriers to Discharge: Continued Medical Work up  Expected Discharge Plan and Services Expected Discharge Plan: Skilled Nursing Facility In-house Referral: Clinical Social Work     Living arrangements for the past 2 months: Single Family Home                                       Social Determinants of Health (SDOH) Interventions    Readmission Risk Interventions No flowsheet data found.

## 2020-02-25 LAB — MAGNESIUM: Magnesium: 1.7 mg/dL (ref 1.7–2.4)

## 2020-02-25 MED ORDER — ZINC OXIDE 40 % EX OINT: TOPICAL_OINTMENT | Freq: Two times a day (BID) | CUTANEOUS | 0 refills | Status: AC

## 2020-02-25 MED ORDER — ACETAMINOPHEN 325 MG PO TABS: 650.0000 mg | ORAL_TABLET | Freq: Three times a day (TID) | ORAL | 1 refills | Status: AC | PRN

## 2020-02-25 MED ORDER — NITROGLYCERIN 0.4 MG/HR TD PT24: 0.4000 mg | MEDICATED_PATCH | Freq: Every day | TRANSDERMAL | 12 refills | Status: AC

## 2020-02-25 MED ORDER — METOPROLOL TARTRATE 25 MG PO TABS: 12.5000 mg | ORAL_TABLET | Freq: Two times a day (BID) | ORAL | 1 refills | Status: AC

## 2020-02-25 MED ORDER — MAGNESIUM OXIDE 400 (241.3 MG) MG PO TABS: 400.0000 mg | ORAL_TABLET | Freq: Every day | ORAL | 3 refills | Status: AC

## 2020-02-25 MED ORDER — GABAPENTIN 300 MG PO CAPS: 300.0000 mg | ORAL_CAPSULE | Freq: Every day | ORAL | 3 refills | Status: AC

## 2020-02-25 MED ORDER — FLUCONAZOLE 100 MG PO TABS: 100.0000 mg | ORAL_TABLET | Freq: Every day | ORAL | 0 refills | Status: AC

## 2020-02-25 MED ORDER — SIMETHICONE 80 MG PO CHEW: 80.0000 mg | CHEWABLE_TABLET | Freq: Four times a day (QID) | ORAL | 0 refills | Status: AC | PRN

## 2020-02-25 MED ORDER — NITROGLYCERIN 0.4 MG SL SUBL: 0.4000 mg | SUBLINGUAL_TABLET | SUBLINGUAL | 1 refills | Status: AC | PRN

## 2020-02-25 MED ORDER — DIPHENHYDRAMINE HCL 25 MG PO CAPS: 25.0000 mg | ORAL_CAPSULE | Freq: Two times a day (BID) | ORAL | 3 refills | Status: AC

## 2020-02-25 MED ORDER — POLYETHYLENE GLYCOL 3350 17 G PO PACK: 17.0000 g | PACK | Freq: Every day | ORAL | 3 refills | Status: AC

## 2020-02-25 MED ORDER — LEVOTHYROXINE SODIUM 100 MCG PO TABS: 100.0000 ug | ORAL_TABLET | Freq: Every day | ORAL | 3 refills | Status: AC

## 2020-02-25 NOTE — Progress Notes (Signed)
PT Cancellation Note  Patient Details Name: Laurie Chavez MRN: [id number] DOB: 03/15/1950   Cancelled Treatment:    Reason Eval/Treat Not Completed: (P) Other (comment) (Pt to d/c back to [facility], SW to call [facility] for transport.  Will defer PT needs to next level of care.)   [name] J Aundria Rud 02/25/2020, 11:42 AM  Bonney Leitz , PTA Acute Rehabilitation Services Pager [phone] Office [phone]

## 2020-02-25 NOTE — Progress Notes (Signed)
Pt discharged to [facility]. IVs and telemetry box removed. Pt discharged with all of her belongings.

## 2020-02-25 NOTE — Discharge Summary (Signed)
Physician Discharge Summary  Patient ID: Laurie Chavez MRN: [id number] DOB/AGE: [birth year] 70 y.o.  Admit date: 02/15/2020 Discharge date: 02/25/2020  Admission Diagnoses: Acute coronary syndrome Multivessel CAD S/P multiple stents Paroxysmal atrial fibrillation Morbid obesity S/P PE S/P COVID infection  Discharge Diagnoses:  Principal Problem:   Acute coronary syndrome (HCC) Active Problems:   Paroxysmal atrial fibrillation (HCC)   Multivessel, native vessel CAD   S/P multiple stents   Hypothyroidism   Bilateral leg weakness   Chronic lower back thoracic spine diskitis   Abnormal LFT, medication induced   Loss of appetite, medication induced   Pruritus, medication induced   Obstructive sleep apnea   COVID-19 virus infection and reinfection   Class 2 obesity due to excess calories with body mass index (BMI) of 37.0 to 37.9 in adult   Pressure injury of skin    Discharged Condition: fair  Hospital Course: 70 years old white female with PMH of CAD with multiple stents, HTN, HLD, PE, OSA, paroxysmal atrial fibrillation, s/p COVID infection, morbid obesity and bilateral lower leg weakness had chest pain improving with SL NTG. Her troponi I levels were normal. She had cardiac cath in 2020 showing patent stents in LAD, LCx and RCA with high grade stenosis in septal perforator and distal LAD. Patient agreed to medical therapy and was treated with metoprolol and NTG patch.  She had reinfection with COVID-19 as she was positive for SARS 2 PCR test with low cycle threshold. She was treated with IV Remdesivir. Her levothyroxin dose was increased to 100 mcg daily. She had poor oral intake for several days with abnormal LFTS as side effect of medications. Her appetite slowly improved and she was discharged to area SNF/Rehab center.  Consults: cardiology and Internal medicine  Significant Diagnostic Studies: labs: Near normal CBC with mild leukocytopenia and thrombocytopenia. Her SARS  Coronavirus 2 test was positive more than 90 days from initial infection with normal D-dimer and C-reactive protein levels but low cycle threshold for COVID -19 PCR test, suggestive of reinfection.  HS-Troponin I levels were normal x 2. Transient elevation of LFTS post Remdesivir treatment.  EKG: Normal sinus rhythm with poor r wave progression.  CXR: Cardiomegaly and bibasilar scarring.  X-ray abdomen was unremarkable.  Treatments: cardiac meds: Apixaban, Atorvastatin, metoprolol and NTG patch and Remdesivir. Oxygen 2L/min by [place].  Discharge Exam: Blood pressure (!) 115/92, pulse 63, temperature 97.7 F (36.5 C), temperature source Oral, resp. rate 14, height 5' (1.524 m), weight 87.2 kg, SpO2 94 %. General appearance: alert, cooperative and appears stated age. Head: Normocephalic, atraumatic. Eyes: Blue eyes, pink conjunctiva, corneas clear. PERRL, EOM's intact.  Neck: No adenopathy, no carotid bruit, no JVD, supple, symmetrical, trachea midline and thyroid not enlarged. Resp: Clearing to auscultation bilaterally. Cardio: Regular rate and rhythm, S1, S2 normal, II/VI systolic murmur, no click, rub or gallop. GI: Soft, non-tender; bowel sounds normal; no organomegaly. Extremities: No edema, cyanosis or clubbing. Skin: Warm and dry.  Neurologic: Alert and oriented X 3. Bilateral lower leg weakness.  Disposition: Discharge disposition: 03-Skilled Nursing Facility        Allergies as of 02/25/2020      Reactions   Black Walnut Pollen Allergy Skin Test Swelling   Walnuts cause tongue swelling   Iodinated Diagnostic Agents Anaphylaxis   Red Dye Swelling   Body swelling   Zoster Vac Recomb Adjuvanted Swelling   Facial swelling   Tramadol Nausea And Vomiting      Medication List    STOP  taking these medications   amiodarone 100 MG tablet Commonly known as: PACERONE   aspirin EC 81 MG tablet   clotrimazole-betamethasone cream Commonly known as: LOTRISONE   Fish Oil  1000 MG Caps   HYDROcodone-acetaminophen 5-325 MG tablet Commonly known as: NORCO/VICODIN   melatonin 3 MG Tabs tablet   zinc sulfate 220 (50 Zn) MG capsule     TAKE these medications   acetaminophen 325 MG tablet Commonly known as: TYLENOL Take 2 tablets (650 mg total) by mouth every 8 (eight) hours as needed for mild pain or headache.   apixaban 5 MG Tabs tablet Commonly known as: ELIQUIS Take 5 mg by mouth 2 (two) times daily.   ascorbic acid 500 MG tablet Commonly known as: VITAMIN C Take 500 mg by mouth daily.   atorvastatin 80 MG tablet Commonly known as: LIPITOR Take 80 mg by mouth at bedtime.   buPROPion 75 MG tablet Commonly known as: WELLBUTRIN Take 75 mg by mouth daily.   Digestive Advantage Caps Take 1 capsule by mouth daily.   diphenhydrAMINE 25 mg capsule Commonly known as: BENADRYL Take 1 capsule (25 mg total) by mouth 2 (two) times daily.   doxycycline 100 MG tablet Commonly known as: VIBRA-TABS Take 100 mg by mouth 2 (two) times daily.   Ensure Take 237 mLs by mouth 3 (three) times daily.   feeding supplement (PRO-STAT SUGAR FREE 64) Liqd Take 30 mLs by mouth 3 (three) times daily.   fluconazole 100 MG tablet Commonly known as: DIFLUCAN Take 1 tablet (100 mg total) by mouth daily for 7 days. Start taking on: February 26, 2020   gabapentin 300 MG capsule Commonly known as: NEURONTIN Take 1 capsule (300 mg total) by mouth at bedtime. What changed:   medication strength  how much to take  when to take this  additional instructions  Another medication with the same name was removed. Continue taking this medication, and follow the directions you see here.   levothyroxine 100 MCG tablet Commonly known as: SYNTHROID Take 1 tablet (100 mcg total) by mouth daily at 6 (six) AM. Start taking on: February 26, 2020 What changed:   medication strength  how much to take   lidocaine 5 % Commonly known as: LIDODERM Place 1 patch onto the  skin daily. Apply to lower back. Remove & Discard patch within 12 hours or as directed by MD   liver oil-zinc oxide 40 % ointment Commonly known as: DESITIN Apply topically 2 (two) times daily.   magnesium oxide 400 (241.3 Mg) MG tablet Commonly known as: MAG-OX Take 1 tablet (400 mg total) by mouth daily. Start taking on: February 26, 2020   metoprolol tartrate 25 MG tablet Commonly known as: LOPRESSOR Take 0.5 tablets (12.5 mg total) by mouth 2 (two) times daily.   nitroGLYCERIN 0.4 MG SL tablet Commonly known as: NITROSTAT Place 1 tablet (0.4 mg total) under the tongue every 5 (five) minutes x 3 doses as needed for chest pain.   nitroGLYCERIN 0.4 mg/hr patch Commonly known as: NITRODUR - Dosed in mg/24 hr Place 1 patch (0.4 mg total) onto the skin daily. Start taking on: February 26, 2020   ondansetron 4 MG tablet Commonly known as: ZOFRAN Take 4 mg by mouth every 6 (six) hours as needed for nausea or vomiting.   OXYGEN Inhale 2 L into the lungs continuous.   pantoprazole 40 MG tablet Commonly known as: PROTONIX Take 40 mg by mouth daily at 6 (six) AM.  polyethylene glycol 17 g packet Commonly known as: MIRALAX / GLYCOLAX Take 17 g by mouth daily. Start taking on: February 26, 2020   PRESCRIPTION MEDICATION Inhale into the lungs at bedtime. BI-PAP   simethicone 80 MG chewable tablet Commonly known as: MYLICON Chew 1 tablet (80 mg total) by mouth 4 (four) times daily as needed for flatulence.       Follow-up Information    Orpah Cobb, MD. Schedule an appointment as soon as possible for a visit in 2 week(s).   Specialty: Cardiology Contact information: [address] [place] [address] [phone]               Time spent: Review of old chart, current chart, lab, x-ray, cardiac tests and discussion with patient over 60 minutes.  Signed: Ricki Rodriguez 02/25/2020, 10:19 AM

## 2020-02-25 NOTE — TOC Transition Note (Signed)
Transition of Care [facility]) - CM/SW Discharge Note   Patient Details  Name: Laurie Chavez MRN: [id number] Date of Birth: 02/23/1950  [facility] CM/SW Contact:  Eduard Roux, LCSWA Phone Number: 02/25/2020, 12:10 PM   Clinical Narrative:     Patient will DC to: [facility]  DC Date: 02/25/2020 Family Notified: Judy,daughter Transport By: Sharin Mons   Per MD patient is ready for discharge. RN, patient, and facility notified of DC. Discharge Summary sent to facility. RN given number for [phone], Room 117. Ambulance transport requested for patient.   Clinical Social Worker signing off.  Antony Blackbird, MSW, LCSW Clinical Social Worker    Final next level of care: Skilled Nursing Facility Barriers to Discharge: Barriers Resolved   Patient Goals and CMS Choice        Discharge Placement              Patient chooses bed at: [facility] Patient to be transferred to facility by: [facility] Name of family member notified: daugter,Judy Patient and family notified of of transfer: 02/25/20  Discharge Plan and Services In-house Referral: Clinical Social Work                                   Social Determinants of Health (SDOH) Interventions     Readmission Risk Interventions No flowsheet data found.

## 2020-04-21 ENCOUNTER — Other Ambulatory Visit: Payer: Self-pay | Admitting: Internal Medicine

## 2020-04-21 DIAGNOSIS — M546 Pain in thoracic spine: Secondary | ICD-10-CM

## 2022-11-15 IMAGING — CT CT T SPINE W/ CM
3 of 4 series · 10 of 35 positions shown, 12 images · IV contrast (omnipaque)
Comparison: None.

CLINICAL DATA: Mid back pain and osteomyelitis

EXAM:
CT THORACIC SPINE WITH CONTRAST
TECHNIQUE: Multidetector CT images of thoracic was performed according to the
standard protocol following intravenous contrast administration.
CONTRAST:  100mL OMNIPAQUE IOHEXOL 300 MG/ML  SOLN

[Series 4: t-spine 2.0 st · axial · 0.29mm/px · z∈[+149,+257]mm · 2 of 127 slices shown, 3 images]
[im 37/127  soft-tissue]
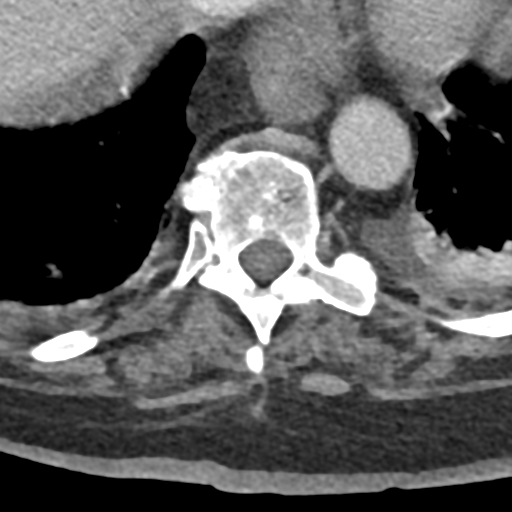
[im 37/127  bone]
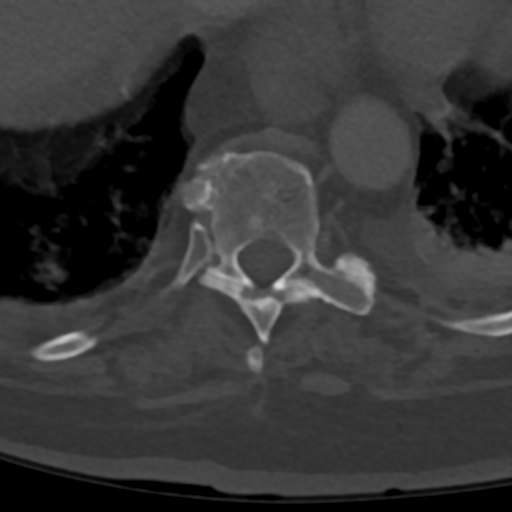
[im 91/127  bone]
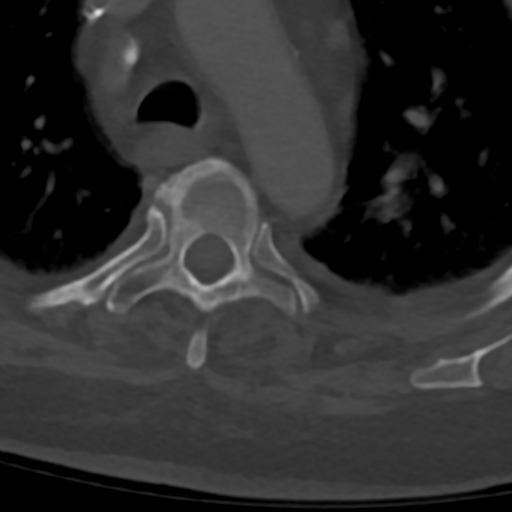

[Series 8: t-spine 2.0 cor bone · coronal · 0.37mm/px · 3 of 61 slices shown]
[im 13/61  bone]
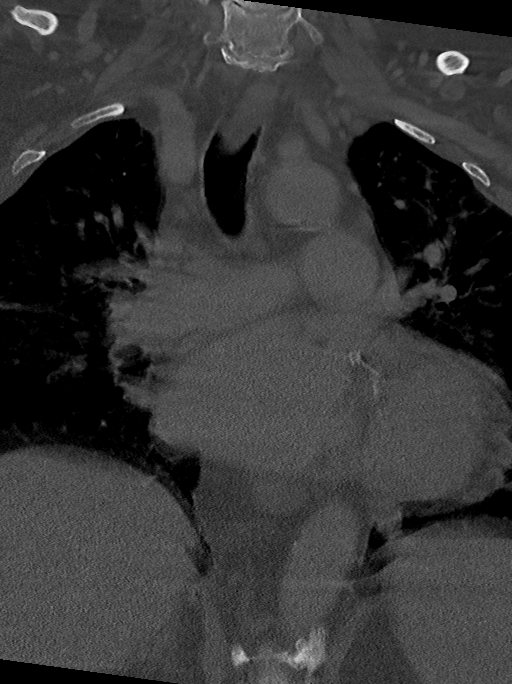
[im 25/61  bone]
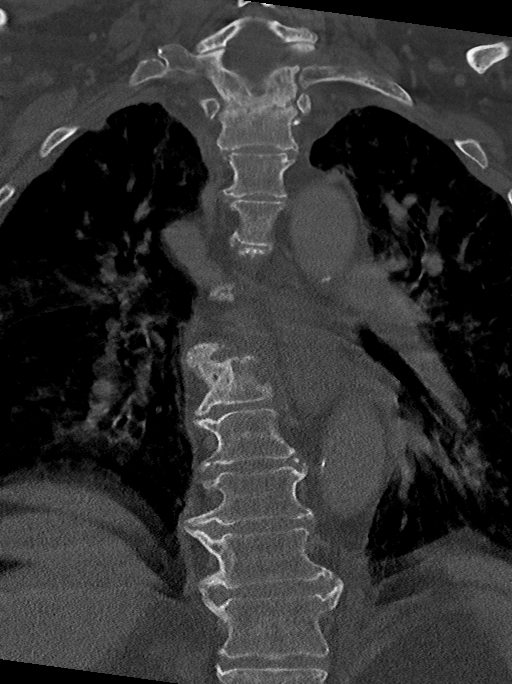
[im 37/61  bone]
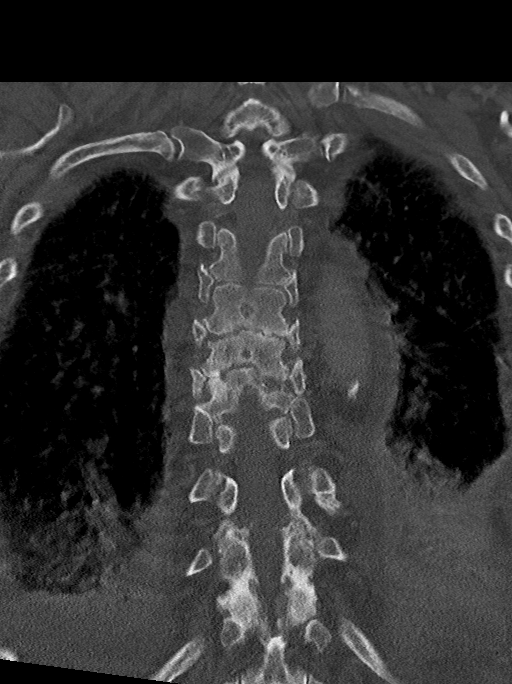

[Series 9: t-spine 2.0 sag bone · sagittal · 0.24mm/px · 5 of 61 slices shown, 6 images]
[im 21/61  bone]
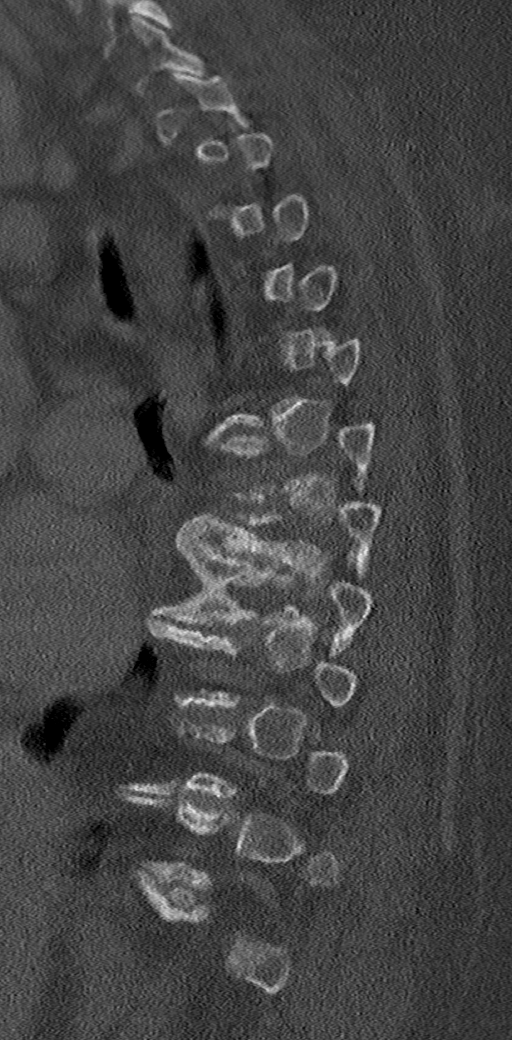
[im 26/61  bone]
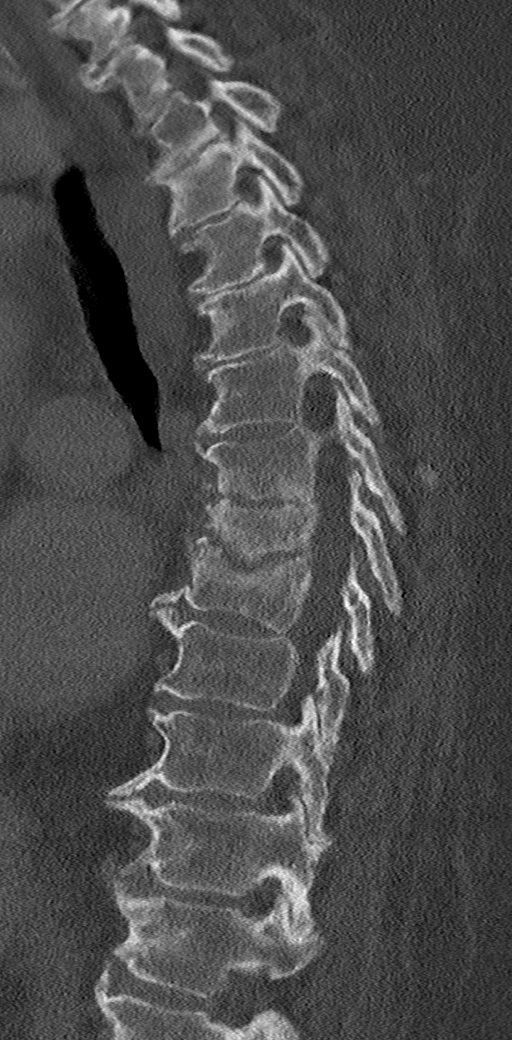
[im 31/61  soft-tissue]
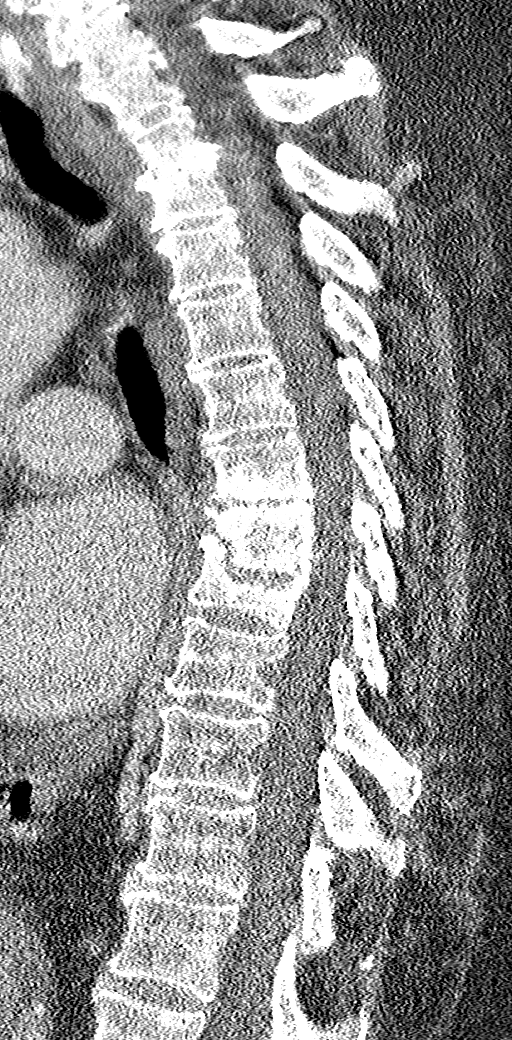
[im 31/61  bone]
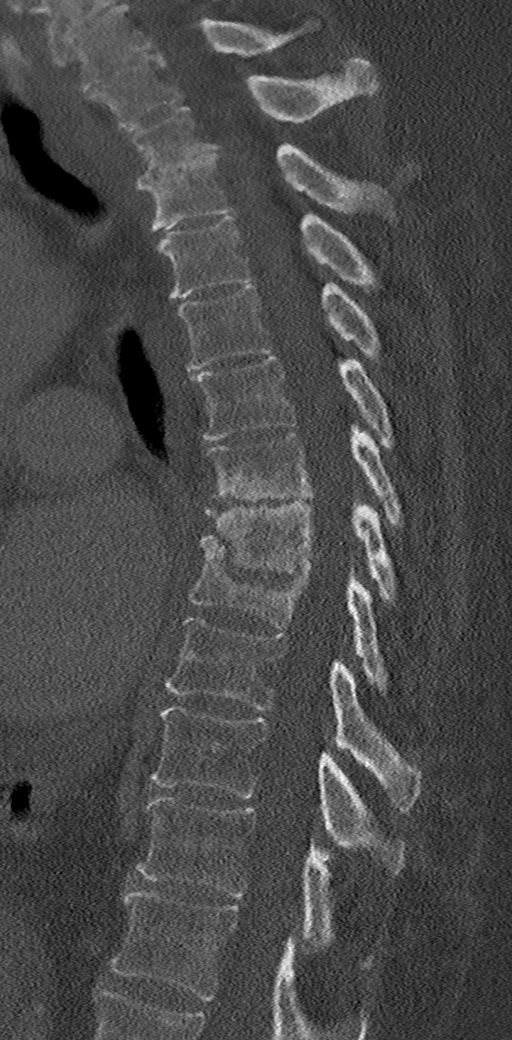
[im 36/61  bone]
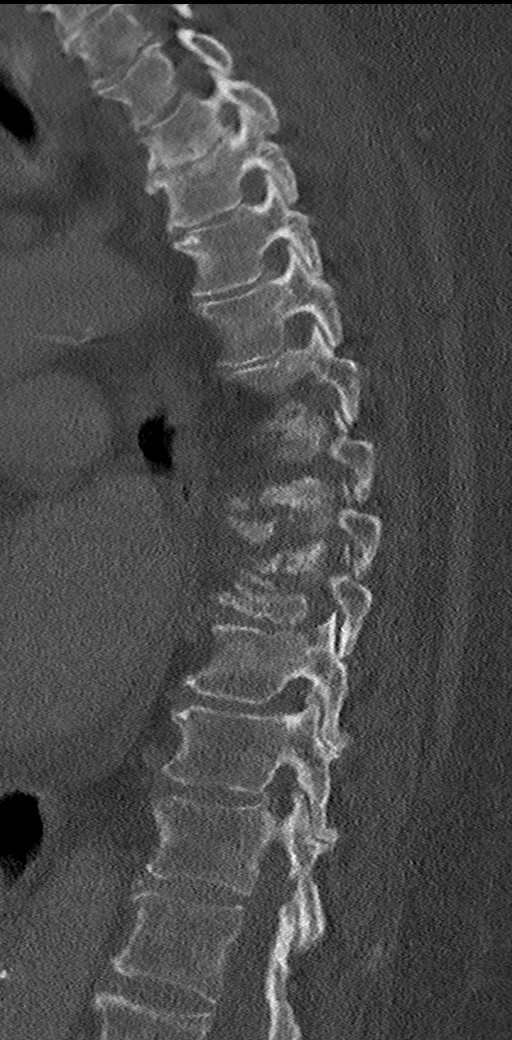
[im 41/61  bone]
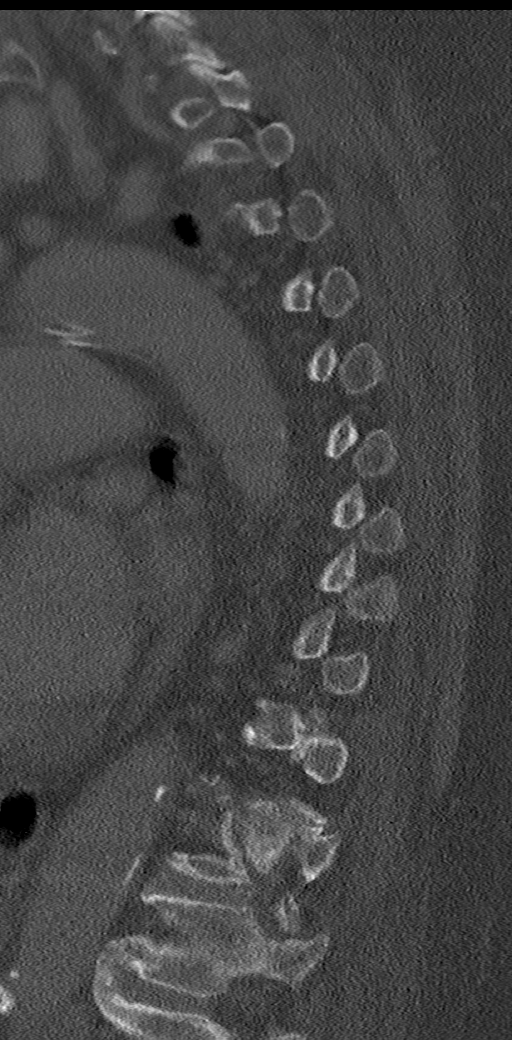

[10 of 35 positions shown; findings below may reference images not displayed]

FINDINGS: Alignment: Exaggerated kyphosis at the levels of disc collapse in
the midthoracic spine.

Vertebrae: Endplate sclerosis and destruction at T6-7 and especially
T7-8, with superior endplate depression greatest at T8. These
findings correlate with notes reporting known discitis osteomyelitis
at these levels on outside imaging. Paravertebral fat stranding is
likely present but no visible paravertebral abscess.

Endplate sclerosis and irregularity at T1-2 without adjacent fat
stranding, which could be from prior inflammation or degenerative.
No acute fracture. No focal bone lesion.

Paraspinal and other soft tissues: As above. There also trace
pleural effusions and mild atelectasis. The heart is enlarged.

Disc levels: Aside from the discitis findings there is generalized
disc narrowing and endplate degeneration with ventral endplate
spurring. Degenerative facet spurring in the mid to lower thoracic
spine without focal or advanced level. A small calcified disc
protrusion is seen left paracentral at T7-8 and right paracentral at
T3-4. No visible cord compression.
IMPRESSION: 1. T6-7 and T7-8 discitis/osteomyelitis which is known per chart.
Endplate destruction is advanced and there is especially T8 partial
vertebral collapse. No available comparison imaging or reports to
determine if there has been progression. There is some paravertebral
edema without visible abscess.
2. T1-2 disc disease with endplate irregularity which could be
degenerative or from discitis (even more remote).
